# Patient Record
Sex: Female | Born: 1965 | Race: White | Hispanic: No | Marital: Married | Smoking: Never smoker
Health system: Southern US, Community
[De-identification: ages and names within clinical notes are randomized; demographics above are authoritative.]

## PROBLEM LIST (undated history)

## (undated) DIAGNOSIS — T7840XA Allergy, unspecified, initial encounter: Secondary | ICD-10-CM

## (undated) DIAGNOSIS — Z8489 Family history of other specified conditions: Secondary | ICD-10-CM

## (undated) DIAGNOSIS — M8580 Other specified disorders of bone density and structure, unspecified site: Secondary | ICD-10-CM

## (undated) DIAGNOSIS — Z923 Personal history of irradiation: Secondary | ICD-10-CM

## (undated) DIAGNOSIS — C801 Malignant (primary) neoplasm, unspecified: Secondary | ICD-10-CM

## (undated) DIAGNOSIS — M810 Age-related osteoporosis without current pathological fracture: Secondary | ICD-10-CM

## (undated) HISTORY — DX: Allergy, unspecified, initial encounter: T78.40XA

## (undated) HISTORY — DX: Age-related osteoporosis without current pathological fracture: M81.0

## (undated) HISTORY — DX: Other specified disorders of bone density and structure, unspecified site: M85.80

---

## 1987-04-24 HISTORY — PX: WISDOM TOOTH EXTRACTION: SHX21

## 1998-01-10 ENCOUNTER — Other Ambulatory Visit: Admission: RE | Admit: 1998-01-10 | Discharge: 1998-01-10 | Payer: Self-pay | Admitting: Obstetrics and Gynecology

## 1999-01-23 ENCOUNTER — Other Ambulatory Visit: Admission: RE | Admit: 1999-01-23 | Discharge: 1999-01-23 | Payer: Self-pay | Admitting: Obstetrics and Gynecology

## 1999-04-10 ENCOUNTER — Encounter: Admission: RE | Admit: 1999-04-10 | Discharge: 1999-04-10 | Payer: Self-pay | Admitting: Obstetrics and Gynecology

## 1999-04-10 ENCOUNTER — Encounter: Payer: Self-pay | Admitting: Obstetrics and Gynecology

## 2000-03-18 ENCOUNTER — Other Ambulatory Visit: Admission: RE | Admit: 2000-03-18 | Discharge: 2000-03-18 | Payer: Self-pay | Admitting: Obstetrics and Gynecology

## 2000-05-16 ENCOUNTER — Encounter: Payer: Self-pay | Admitting: Obstetrics and Gynecology

## 2000-05-16 ENCOUNTER — Encounter: Admission: RE | Admit: 2000-05-16 | Discharge: 2000-05-16 | Payer: Self-pay | Admitting: Obstetrics and Gynecology

## 2001-05-13 ENCOUNTER — Other Ambulatory Visit: Admission: RE | Admit: 2001-05-13 | Discharge: 2001-05-13 | Payer: Self-pay | Admitting: Obstetrics and Gynecology

## 2002-06-16 ENCOUNTER — Other Ambulatory Visit: Admission: RE | Admit: 2002-06-16 | Discharge: 2002-06-16 | Payer: Self-pay | Admitting: Obstetrics and Gynecology

## 2002-12-08 ENCOUNTER — Encounter: Payer: Self-pay | Admitting: Obstetrics and Gynecology

## 2002-12-08 ENCOUNTER — Encounter: Admission: RE | Admit: 2002-12-08 | Discharge: 2002-12-08 | Payer: Self-pay | Admitting: Obstetrics and Gynecology

## 2003-07-20 ENCOUNTER — Other Ambulatory Visit: Admission: RE | Admit: 2003-07-20 | Discharge: 2003-07-20 | Payer: Self-pay | Admitting: Obstetrics and Gynecology

## 2004-10-17 ENCOUNTER — Other Ambulatory Visit: Admission: RE | Admit: 2004-10-17 | Discharge: 2004-10-17 | Payer: Self-pay | Admitting: Obstetrics and Gynecology

## 2013-05-14 ENCOUNTER — Other Ambulatory Visit: Payer: Self-pay | Admitting: Obstetrics and Gynecology

## 2013-05-14 DIAGNOSIS — Z803 Family history of malignant neoplasm of breast: Secondary | ICD-10-CM

## 2014-12-21 ENCOUNTER — Ambulatory Visit (INDEPENDENT_AMBULATORY_CARE_PROVIDER_SITE_OTHER): Payer: 59 | Admitting: Family Medicine

## 2014-12-21 ENCOUNTER — Encounter: Payer: Self-pay | Admitting: Family Medicine

## 2014-12-21 VITALS — BP 128/74 | HR 70 | Temp 98.2°F | Ht 64.75 in | Wt 123.8 lb

## 2014-12-21 DIAGNOSIS — Z0000 Encounter for general adult medical examination without abnormal findings: Secondary | ICD-10-CM

## 2014-12-21 DIAGNOSIS — Z01419 Encounter for gynecological examination (general) (routine) without abnormal findings: Secondary | ICD-10-CM | POA: Insufficient documentation

## 2014-12-21 LAB — COMPREHENSIVE METABOLIC PANEL
ALT: 15 U/L (ref 0–35)
AST: 15 U/L (ref 0–37)
Albumin: 4.2 g/dL (ref 3.5–5.2)
Alkaline Phosphatase: 66 U/L (ref 39–117)
BUN: 12 mg/dL (ref 6–23)
CO2: 30 mEq/L (ref 19–32)
Calcium: 9.5 mg/dL (ref 8.4–10.5)
Chloride: 102 mEq/L (ref 96–112)
Creatinine, Ser: 0.68 mg/dL (ref 0.40–1.20)
GFR: 96.84 mL/min (ref >60.00)
Glucose, Bld: 82 mg/dL (ref 70–99)
Potassium: 4.3 mEq/L (ref 3.5–5.1)
Sodium: 137 mEq/L (ref 135–145)
Total Bilirubin: 0.5 mg/dL (ref 0.2–1.2)
Total Protein: 7 g/dL (ref 6.0–8.3)

## 2014-12-21 LAB — CBC WITH DIFFERENTIAL/PLATELET
Basophils Absolute: 0 10*3/uL (ref 0.0–0.1)
Basophils Relative: 0.6 % (ref 0.0–3.0)
Eosinophils Absolute: 0.1 10*3/uL (ref 0.0–0.7)
Eosinophils Relative: 1.5 % (ref 0.0–5.0)
HCT: 44.1 % (ref 36.0–46.0)
Hemoglobin: 14.4 g/dL (ref 12.0–15.0)
Lymphocytes Relative: 31.8 % (ref 12.0–46.0)
Lymphs Abs: 2.1 10*3/uL (ref 0.7–4.0)
MCHC: 32.6 g/dL (ref 30.0–36.0)
MCV: 93.8 fl (ref 78.0–100.0)
Monocytes Absolute: 0.5 10*3/uL (ref 0.1–1.0)
Monocytes Relative: 7.8 % (ref 3.0–12.0)
Neutro Abs: 3.8 10*3/uL (ref 1.4–7.7)
Neutrophils Relative %: 58.3 % (ref 43.0–77.0)
Platelets: 315 10*3/uL (ref 150.0–400.0)
RBC: 4.7 Mil/uL (ref 3.87–5.11)
RDW: 13 % (ref 11.5–15.5)
WBC: 6.5 10*3/uL (ref 4.0–10.5)

## 2014-12-21 LAB — LIPID PANEL
Cholesterol: 132 mg/dL (ref 0–200)
HDL: 67.4 mg/dL (ref >39.00)
LDL Cholesterol: 36 mg/dL (ref 0–99)
NonHDL: 64.43
Total CHOL/HDL Ratio: 2
Triglycerides: 141 mg/dL (ref 0.0–149.0)
VLDL: 28.2 mg/dL (ref 0.0–40.0)

## 2014-12-21 LAB — VITAMIN D 25 HYDROXY (VIT D DEFICIENCY, FRACTURES): VITD: 47.78 ng/mL (ref 30.00–100.00)

## 2014-12-21 LAB — TSH: TSH: 1.48 u[IU]/mL (ref 0.35–4.50)

## 2014-12-21 NOTE — Progress Notes (Signed)
Subjective:   Patient ID: Susan Reid, female    DOB: [birth year], 49 y.o.   MRN: [id number]  Susan Reid is a pleasant 49 y.o. year old female who presents to clinic today with Establish Care  on 12/21/2014  HPI: G2P2- has OBGYN, Dr. Radene Knee.  Per pt, pap smear and mammogram done 06/2014.  Has no complaints today.  Active- walks daily.  Saw dermatologist, Dr. Allyson Sabal, last month for skin survey.  No current outpatient prescriptions on file prior to visit.   No current facility-administered medications on file prior to visit.    No Known Allergies  Past Medical History  Diagnosis Date  . Allergy   . Osteopenia     Past Surgical History  Procedure Laterality Date  . Wisdom tooth extraction  1989    Family History  Problem Relation Age of Onset  . Arthritis Mother   . Cancer Mother   . Hyperlipidemia Mother   . Heart disease Mother   . Stroke Mother   . Hypertension Mother   . Kidney disease Mother   . Mental illness Father   . Depression Father   . Cancer Maternal Grandfather     Social History   Social History  . Marital Status: Single    Spouse Name: N/A  . Number of Children: N/A  . Years of Education: N/A   Occupational History  . Not on file.   Social History Main Topics  . Smoking status: Never Smoker   . Smokeless tobacco: Never Used  . Alcohol Use: No  . Drug Use: No  . Sexual Activity: Yes   Other Topics Concern  . Not on file   Social History Narrative  . No narrative on file   The PMH, PSH, Social History, Family History, Medications, and allergies have been reviewed in [facility], and have been updated if relevant.   Review of Systems  Constitutional: Negative.   HENT: Negative.   Eyes: Negative.   Respiratory: Negative.   Cardiovascular: Negative.   Gastrointestinal: Negative.   Endocrine: Negative.   Genitourinary: Negative.   Musculoskeletal: Negative.   Skin: Negative.   Allergic/Immunologic: Negative.   Neurological:  Negative.   Hematological: Negative.   Psychiatric/Behavioral: Negative.   All other systems reviewed and are negative.      Objective:    BP 128/74 mmHg  Pulse 70  Temp(Src) 98.2 F (36.8 C) (Oral)  Ht 5' 4.75" (1.645 m)  Wt 123 lb 12 oz (56.133 kg)  BMI 20.74 kg/m2  SpO2 98%  LMP 12/11/2014   Physical Exam   General:  Well-developed,well-nourished,in no acute distress; alert,appropriate and cooperative throughout examination Head:  normocephalic and atraumatic.   Eyes:  vision grossly intact, pupils equal, pupils round, and pupils reactive to light.   Ears:  R ear normal and L ear normal.   Nose:  no external deformity.   Mouth:  good dentition.   Neck:  No deformities, masses, or tenderness noted. Lungs:  Normal respiratory effort, chest expands symmetrically. Lungs are clear to auscultation, no crackles or wheezes. Heart:  Normal rate and regular rhythm. S1 and S2 normal without gallop, murmur, click, rub or other extra sounds. Abdomen:  Bowel sounds positive,abdomen soft and non-tender without masses, organomegaly or hernias noted. Msk:  No deformity or scoliosis noted of thoracic or lumbar spine.   Extremities:  No clubbing, cyanosis, edema, or deformity noted with normal full range of motion of all joints.   Neurologic:  alert & oriented X3 and  gait normal.   Skin:  Intact without suspicious lesions or rashes Cervical Nodes:  No lymphadenopathy noted Axillary Nodes:  No palpable lymphadenopathy Psych:  Cognition and judgment appear intact. Alert and cooperative with normal attention span and concentration. No apparent delusions, illusions, hallucinations       Assessment & Plan:   Well woman exam No Follow-up on file.

## 2014-12-21 NOTE — Progress Notes (Signed)
Pre visit review using our clinic review tool, if applicable. No additional management support is needed unless otherwise documented below in the visit note. 

## 2014-12-21 NOTE — Patient Instructions (Signed)
Great to see you. We will call you with your lab results and you can see them online.

## 2014-12-21 NOTE — Assessment & Plan Note (Signed)
Reviewed preventive care protocols, scheduled due services, and updated immunizations Discussed nutrition, exercise, diet, and healthy lifestyle.  Declines flu shot.  Orders Placed This Encounter  Procedures  . CBC with Differential/Platelet  . Comprehensive metabolic panel  . Lipid panel  . TSH  . Vitamin D, 25-hydroxy

## 2014-12-22 ENCOUNTER — Encounter: Payer: Self-pay | Admitting: *Deleted

## 2015-11-14 ENCOUNTER — Telehealth: Payer: Self-pay | Admitting: Family Medicine

## 2015-11-14 NOTE — Telephone Encounter (Signed)
Pt was seen at urgent care 11/08/15 and was given injection of prednisone for poison ivy.  Rash is continuing to spread on legs and pt would like to know if she can get an injection from our office once a week.  Please advise.  [facility], Pt is requesting a callback at [phone]

## 2015-11-14 NOTE — Telephone Encounter (Signed)
Spoke to pt who states she is currently on prednisone dose pak and will continue it until completed

## 2015-11-14 NOTE — Telephone Encounter (Signed)
Lm on pts vm requesting a call back 

## 2015-11-14 NOTE — Telephone Encounter (Signed)
Unfortunately, it would not be good medical care to give her an injection of steroids once a week.  We could certainly place her on a short course of oral prednisone.

## 2016-01-12 ENCOUNTER — Other Ambulatory Visit: Payer: Self-pay | Admitting: Family Medicine

## 2016-01-12 DIAGNOSIS — Z01419 Encounter for gynecological examination (general) (routine) without abnormal findings: Secondary | ICD-10-CM

## 2016-01-20 ENCOUNTER — Other Ambulatory Visit (INDEPENDENT_AMBULATORY_CARE_PROVIDER_SITE_OTHER): Payer: 59

## 2016-01-20 DIAGNOSIS — Z0000 Encounter for general adult medical examination without abnormal findings: Secondary | ICD-10-CM

## 2016-01-20 DIAGNOSIS — Z01419 Encounter for gynecological examination (general) (routine) without abnormal findings: Secondary | ICD-10-CM

## 2016-01-20 LAB — CBC WITH DIFFERENTIAL/PLATELET
BASOS PCT: 0.4 % (ref 0.0–3.0)
Basophils Absolute: 0 10*3/uL (ref 0.0–0.1)
EOS ABS: 0.2 10*3/uL (ref 0.0–0.7)
Eosinophils Relative: 3.6 % (ref 0.0–5.0)
HCT: 43 % (ref 36.0–46.0)
HEMOGLOBIN: 14.6 g/dL (ref 12.0–15.0)
Lymphocytes Relative: 23.8 % (ref 12.0–46.0)
Lymphs Abs: 1.4 10*3/uL (ref 0.7–4.0)
MCHC: 34 g/dL (ref 30.0–36.0)
MCV: 92.1 fl (ref 78.0–100.0)
MONO ABS: 0.5 10*3/uL (ref 0.1–1.0)
Monocytes Relative: 8.8 % (ref 3.0–12.0)
NEUTROS PCT: 63.4 % (ref 43.0–77.0)
Neutro Abs: 3.8 10*3/uL (ref 1.4–7.7)
Platelets: 303 10*3/uL (ref 150.0–400.0)
RBC: 4.66 Mil/uL (ref 3.87–5.11)
RDW: 13.1 % (ref 11.5–15.5)
WBC: 6.1 10*3/uL (ref 4.0–10.5)

## 2016-01-20 LAB — COMPREHENSIVE METABOLIC PANEL
ALBUMIN: 4.1 g/dL (ref 3.5–5.2)
ALT: 17 U/L (ref 0–35)
AST: 20 U/L (ref 0–37)
Alkaline Phosphatase: 81 U/L (ref 39–117)
BUN: 13 mg/dL (ref 6–23)
CHLORIDE: 104 meq/L (ref 96–112)
CO2: 31 meq/L (ref 19–32)
Calcium: 9.2 mg/dL (ref 8.4–10.5)
Creatinine, Ser: 0.76 mg/dL (ref 0.40–1.20)
GFR: 84.81 mL/min (ref >60.00)
GLUCOSE: 88 mg/dL (ref 70–99)
POTASSIUM: 4.2 meq/L (ref 3.5–5.1)
SODIUM: 139 meq/L (ref 135–145)
Total Bilirubin: 0.6 mg/dL (ref 0.2–1.2)
Total Protein: 6.9 g/dL (ref 6.0–8.3)

## 2016-01-20 LAB — LIPID PANEL
CHOL/HDL RATIO: 2
CHOLESTEROL: 144 mg/dL (ref 0–200)
HDL: 78.7 mg/dL (ref >39.00)
LDL CALC: 50 mg/dL (ref 0–99)
NONHDL: 65.71
Triglycerides: 78 mg/dL (ref 0.0–149.0)
VLDL: 15.6 mg/dL (ref 0.0–40.0)

## 2016-01-20 LAB — TSH: TSH: 2.36 u[IU]/mL (ref 0.35–4.50)

## 2016-01-24 ENCOUNTER — Ambulatory Visit (INDEPENDENT_AMBULATORY_CARE_PROVIDER_SITE_OTHER): Payer: 59 | Admitting: Family Medicine

## 2016-01-24 VITALS — BP 114/70 | HR 64 | Temp 98.4°F | Ht 64.5 in | Wt 123.5 lb

## 2016-01-24 DIAGNOSIS — Z01419 Encounter for gynecological examination (general) (routine) without abnormal findings: Secondary | ICD-10-CM | POA: Diagnosis not present

## 2016-01-24 NOTE — Patient Instructions (Signed)
Great to see you. Please say hi to your family for me! 

## 2016-01-24 NOTE — Progress Notes (Signed)
Pre visit review using our clinic review tool, if applicable. No additional management support is needed unless otherwise documented below in the visit note. 

## 2016-01-24 NOTE — Assessment & Plan Note (Signed)
Reviewed preventive care protocols, scheduled due services, and updated immunizations Discussed nutrition, exercise, diet, and healthy lifestyle.  

## 2016-01-24 NOTE — Progress Notes (Signed)
Subjective:   Patient ID: Susan Reid, female    DOB: 1965-06-29, 50 y.o.   MRN: [id number]  Susan Reid is a pleasant 50 y.o. year old female who presents to clinic today with Annual Exam  on 01/24/2016  HPI: G2P2- has OBGYN, Dr. Radene Knee.  Per pt, pap smear and mammogram done 07/2015.Marland Kitchen   Active- walks daily.  Has dermatologist, Dr. Allyson Sabal.  Last saw him in 08/2015.  Lab Results  Component Value Date   CHOL 144 01/20/2016   HDL 78.70 01/20/2016   LDLCALC 50 01/20/2016   TRIG 78.0 01/20/2016   CHOLHDL 2 01/20/2016   Lab Results  Component Value Date   CREATININE 0.76 01/20/2016   Lab Results  Component Value Date   TSH 2.36 01/20/2016   Lab Results  Component Value Date   WBC 6.1 01/20/2016   HGB 14.6 01/20/2016   HCT 43.0 01/20/2016   MCV 92.1 01/20/2016   PLT 303.0 01/20/2016     Current Outpatient Prescriptions on File Prior to Visit  Medication Sig Dispense Refill  . cholecalciferol (VITAMIN D) 1000 UNITS tablet Take 1,000 Units by mouth daily.    . Multiple Vitamin (MULTIVITAMIN) tablet Take 1 tablet by mouth daily.     No current facility-administered medications on file prior to visit.     No Known Allergies  Past Medical History:  Diagnosis Date  . Allergy   . Osteopenia     Past Surgical History:  Procedure Laterality Date  . WISDOM TOOTH EXTRACTION  1989    Family History  Problem Relation Age of Onset  . Arthritis Mother   . Cancer Mother   . Hyperlipidemia Mother   . Heart disease Mother   . Stroke Mother   . Hypertension Mother   . Kidney disease Mother   . Mental illness Father   . Depression Father   . Cancer Maternal Grandfather     Social History   Social History  . Marital status: Single    Spouse name: N/A  . Number of children: N/A  . Years of education: N/A   Occupational History  . Not on file.   Social History Main Topics  . Smoking status: Never Smoker  . Smokeless tobacco: Never Used  . Alcohol use No  .  Drug use: No  . Sexual activity: Yes   Other Topics Concern  . Not on file   Social History Narrative  . No narrative on file   The PMH, PSH, Social History, Family History, Medications, and allergies have been reviewed in [facility], and have been updated if relevant.   Review of Systems  Constitutional: Negative.   HENT: Negative.   Eyes: Negative.   Respiratory: Negative.   Cardiovascular: Negative.   Gastrointestinal: Negative.   Endocrine: Negative.   Genitourinary: Negative.   Musculoskeletal: Negative.   Skin: Negative.   Allergic/Immunologic: Negative.   Neurological: Negative.   Hematological: Negative.   Psychiatric/Behavioral: Negative.   All other systems reviewed and are negative.      Objective:    BP 114/70   Pulse 64   Temp 98.4 F (36.9 C) (Oral)   Ht 5' 4.5" (1.638 m)   Wt 123 lb 8 oz (56 kg)   LMP 01/24/2016   SpO2 99%   BMI 20.87 kg/m   Wt Readings from Last 3 Encounters:  01/24/16 123 lb 8 oz (56 kg)  12/21/14 123 lb 12 oz (56.1 kg)    Physical Exam   General:  Well-developed,well-nourished,in no acute distress; alert,appropriate and cooperative throughout examination Head:  normocephalic and atraumatic.   Eyes:  vision grossly intact, pupils equal, pupils round, and pupils reactive to light.   Ears:  R ear normal and L ear normal.   Nose:  no external deformity.   Mouth:  good dentition.   Neck:  No deformities, masses, or tenderness noted. Lungs:  Normal respiratory effort, chest expands symmetrically. Lungs are clear to auscultation, no crackles or wheezes. Heart:  Normal rate and regular rhythm. S1 and S2 normal without gallop, murmur, click, rub or other extra sounds. Abdomen:  Bowel sounds positive,abdomen soft and non-tender without masses, organomegaly or hernias noted. Msk:  No deformity or scoliosis noted of thoracic or lumbar spine.   Extremities:  No clubbing, cyanosis, edema, or deformity noted with normal full range of motion  of all joints.   Neurologic:  alert & oriented X3 and gait normal.   Skin:  Intact without suspicious lesions or rashes Cervical Nodes:  No lymphadenopathy noted Axillary Nodes:  No palpable lymphadenopathy Psych:  Cognition and judgment appear intact. Alert and cooperative with normal attention span and concentration. No apparent delusions, illusions, hallucinations       Assessment & Plan:   Well woman exam No Follow-up on file.

## 2016-05-25 ENCOUNTER — Encounter: Payer: Self-pay | Admitting: Gastroenterology

## 2016-07-02 ENCOUNTER — Ambulatory Visit (AMBULATORY_SURGERY_CENTER): Payer: Self-pay

## 2016-07-02 ENCOUNTER — Encounter: Payer: Self-pay | Admitting: Gastroenterology

## 2016-07-02 VITALS — Ht 65.0 in | Wt 129.2 lb

## 2016-07-02 DIAGNOSIS — Z1211 Encounter for screening for malignant neoplasm of colon: Secondary | ICD-10-CM

## 2016-07-02 MED ORDER — SUPREP BOWEL PREP KIT 17.5-3.13-1.6 GM/177ML PO SOLN: 1.0000 | Freq: Once | ORAL | 0 refills | Status: AC

## 2016-07-02 NOTE — Progress Notes (Signed)
No allergies to eggs or soy No diet meds No home oxygen No past problems with anesthesia  Declined emmi 

## 2016-07-16 ENCOUNTER — Encounter: Payer: Self-pay | Admitting: Gastroenterology

## 2016-07-16 ENCOUNTER — Ambulatory Visit (AMBULATORY_SURGERY_CENTER): Payer: 59 | Admitting: Gastroenterology

## 2016-07-16 VITALS — BP 114/75 | HR 76 | Temp 98.0°F | Resp 11

## 2016-07-16 DIAGNOSIS — Z1211 Encounter for screening for malignant neoplasm of colon: Secondary | ICD-10-CM

## 2016-07-16 DIAGNOSIS — D124 Benign neoplasm of descending colon: Secondary | ICD-10-CM | POA: Diagnosis not present

## 2016-07-16 DIAGNOSIS — D120 Benign neoplasm of cecum: Secondary | ICD-10-CM | POA: Diagnosis not present

## 2016-07-16 DIAGNOSIS — Z1212 Encounter for screening for malignant neoplasm of rectum: Secondary | ICD-10-CM

## 2016-07-16 MED ORDER — SODIUM CHLORIDE 0.9 % IV SOLN: 500.0000 mL | INTRAVENOUS | Status: DC

## 2016-07-16 NOTE — Patient Instructions (Signed)
Findings:  Diverticulosis, polyps Recommendations:  Resume previous diet, continue current medications, Repeat colonoscopy depending on pathology results.  YOU HAD AN ENDOSCOPIC PROCEDURE TODAY AT [facility]:   Refer to the procedure report that was given to you for any specific questions about what was found during the examination.  If the procedure report does not answer your questions, please call your gastroenterologist to clarify.  If you requested that your care partner not be given the details of your procedure findings, then the procedure report has been included in a sealed envelope for you to review at your convenience later.  YOU SHOULD EXPECT: Some feelings of bloating in the abdomen. Passage of more gas than usual.  Walking can help get rid of the air that was put into your GI tract during the procedure and reduce the bloating. If you had a lower endoscopy (such as a colonoscopy or flexible sigmoidoscopy) you may notice spotting of blood in your stool or on the toilet paper. If you underwent a bowel prep for your procedure, you may not have a normal bowel movement for a few days.  Please Note:  You might notice some irritation and congestion in your nose or some drainage.  This is from the oxygen used during your procedure.  There is no need for concern and it should clear up in a day or so.  SYMPTOMS TO REPORT IMMEDIATELY:   Following lower endoscopy (colonoscopy or flexible sigmoidoscopy):  Excessive amounts of blood in the stool  Significant tenderness or worsening of abdominal pains  Swelling of the abdomen that is new, acute  Fever of 100F or higher   Following upper endoscopy (EGD)  Vomiting of blood or coffee ground material  New chest pain or pain under the shoulder blades  Painful or persistently difficult swallowing  New shortness of breath  Fever of 100F or higher  Black, tarry-looking stools  For urgent or emergent issues, a gastroenterologist  can be reached at any hour by calling [phone].   DIET:  We do recommend a small meal at first, but then you may proceed to your regular diet.  Drink plenty of fluids but you should avoid alcoholic beverages for 24 hours.  ACTIVITY:  You should plan to take it easy for the rest of today and you should NOT DRIVE or use heavy machinery until tomorrow (because of the sedation medicines used during the test).    FOLLOW UP: Our staff will call the number listed on your records the next business day following your procedure to check on you and address any questions or concerns that you may have regarding the information given to you following your procedure. If we do not reach you, we will leave a message.  However, if you are feeling well and you are not experiencing any problems, there is no need to return our call.  We will assume that you have returned to your regular daily activities without incident.  If any biopsies were taken you will be contacted by phone or by letter within the next 1-3 weeks.  Please call us at [phone] if you have not heard about the biopsies in 3 weeks.    SIGNATURES/CONFIDENTIALITY: You and/or your care partner have signed paperwork which will be entered into your electronic medical record.  These signatures attest to the fact that that the information above on your After Visit Summary has been reviewed and is understood.  Full responsibility of the confidentiality of this discharge  information lies with you and/or your care-partner.

## 2016-07-16 NOTE — Progress Notes (Signed)
Called to room to assist during endoscopic procedure.  Patient ID and intended procedure confirmed with present staff. Received instructions for my participation in the procedure from the performing physician.  

## 2016-07-16 NOTE — Progress Notes (Signed)
No egg or soy allergy known to patient  No issues with past sedation with any surgeries  or procedures, no intubation problems  No diet pills per patient No home 02 use per patient  No blood thinners per patient  Pt denies issues with constipation  No A fib or A flutter   

## 2016-07-16 NOTE — Progress Notes (Signed)
Report given to PACU, vss 

## 2016-07-16 NOTE — Op Note (Signed)
[place] Patient Name: Susan Reid Procedure Date: 07/16/2016 8:53 AM MRN: [id number] Endoscopist: Mallie Mussel L. Loletha Carrow , MD Age: 51 Referring MD:  Date of Birth: 1966/03/14 Gender: Female Account #: [id number] Procedure:                Colonoscopy Indications:              Screening for colorectal malignant neoplasm, This                            is the patient's first colonoscopy Medicines:                Monitored Anesthesia Care Procedure:                Pre-Anesthesia Assessment:                           - Prior to the procedure, a History and Physical                            was performed, and patient medications and                            allergies were reviewed. The patient's tolerance of                            previous anesthesia was also reviewed. The risks                            and benefits of the procedure and the sedation                            options and risks were discussed with the patient.                            All questions were answered, and informed consent                            was obtained. Prior Anticoagulants: The patient has                            taken no previous anticoagulant or antiplatelet                            agents. ASA Grade Assessment: I - A normal, healthy                            patient. After reviewing the risks and benefits,                            the patient was deemed in satisfactory condition to                            undergo the procedure.  After obtaining informed consent, the colonoscope                            was passed under direct vision. Throughout the                            procedure, the patient's blood pressure, pulse, and                            oxygen saturations were monitored continuously. The                            Model PCF-H190DL [id number]) scope was introduced                            through the anus and advanced to the  the cecum,                            identified by appendiceal orifice and ileocecal                            valve. The colonoscopy was performed without                            difficulty. The patient tolerated the procedure                            well. The quality of the bowel preparation was                            excellent. The ileocecal valve, appendiceal                            orifice, and rectum were photographed. The quality                            of the bowel preparation was evaluated using the                            BBPS [facility] Bowel Preparation Scale) with scores                            of: Right Colon = 3, Transverse Colon = 3 and Left                            Colon = 3 (entire mucosa seen well with no residual                            staining, small fragments of stool or opaque                            liquid). The total BBPS score equals 9. The bowel  preparation used was SUPREP. Scope In: 8:59:43 AM Scope Out: 9:25:37 AM Scope Withdrawal Time: 0 hours 21 minutes 52 seconds  Total Procedure Duration: 0 hours 25 minutes 54 seconds  Findings:                 The perianal and digital rectal examinations were                            normal.                           Multiple diverticula were found in the left colon.                           Two sessile polyps were found in the splenic                            flexure and cecum. The polyps were 4 to 8 mm in                            size. These polyps were removed with a cold snare.                            Resection and retrieval were complete.                           The exam was otherwise without abnormality on                            direct and retroflexion views. Complications:            No immediate complications. Estimated Blood Loss:     Estimated blood loss: none. Impression:               - Diverticulosis in the left colon.                            - Two 4 to 8 mm polyps at the splenic flexure and                            in the cecum, removed with a cold snare. Resected                            and retrieved.                           - The examination was otherwise normal on direct                            and retroflexion views. Recommendation:           - Patient has a contact number available for                            emergencies. The signs and symptoms of potential  delayed complications were discussed with the                            patient. Return to normal activities tomorrow.                            Written discharge instructions were provided to the                            patient.                           - Resume previous diet.                           - Continue present medications.                           - Await pathology results.                           - Repeat colonoscopy is recommended for                            surveillance. The colonoscopy date will be                            determined after pathology results from today's                            exam become available for review. Henry L. Loletha Carrow, MD 07/16/2016 9:29:46 AM This report has been signed electronically.

## 2016-07-17 ENCOUNTER — Telehealth: Payer: Self-pay

## 2016-07-17 NOTE — Telephone Encounter (Signed)
  Follow up Call-  Call back number 07/16/2016  Post procedure Call Back phone  # [phone]  Permission to leave phone message Yes  Some recent data might be hidden     Patient questions:  Do you have a fever, pain , or abdominal swelling? No. Pain Score  0 *  Have you tolerated food without any problems? Yes.    Have you been able to return to your normal activities? Yes.    Do you have any questions about your discharge instructions: Diet   No. Medications  No. Follow up visit  No.  Do you have questions or concerns about your Care? No.  Actions: * If pain score is 4 or above: No action needed, pain <4.

## 2016-07-25 ENCOUNTER — Telehealth: Payer: Self-pay | Admitting: Gastroenterology

## 2016-07-25 NOTE — Telephone Encounter (Signed)
Left message for patient that unfortunately Dr. Loletha Carrow is out of the office this week and will be back on Monday. Gently reminded patient, that within visit summary it states that getting results can take 1-3 weeks. Let her know that I would send a reminder to Dr. Loletha Carrow to please view path results, as soon as possible.

## 2016-07-30 ENCOUNTER — Encounter: Payer: Self-pay | Admitting: Gastroenterology

## 2016-08-14 DIAGNOSIS — Z1231 Encounter for screening mammogram for malignant neoplasm of breast: Secondary | ICD-10-CM | POA: Diagnosis not present

## 2016-09-20 DIAGNOSIS — Z01419 Encounter for gynecological examination (general) (routine) without abnormal findings: Secondary | ICD-10-CM | POA: Diagnosis not present

## 2016-09-20 DIAGNOSIS — Z6820 Body mass index (BMI) 20.0-20.9, adult: Secondary | ICD-10-CM | POA: Diagnosis not present

## 2017-02-19 ENCOUNTER — Other Ambulatory Visit: Payer: Self-pay | Admitting: Family Medicine

## 2017-02-19 DIAGNOSIS — Z01419 Encounter for gynecological examination (general) (routine) without abnormal findings: Secondary | ICD-10-CM

## 2017-02-22 ENCOUNTER — Other Ambulatory Visit (INDEPENDENT_AMBULATORY_CARE_PROVIDER_SITE_OTHER): Payer: 59

## 2017-02-22 DIAGNOSIS — Z01419 Encounter for gynecological examination (general) (routine) without abnormal findings: Secondary | ICD-10-CM

## 2017-02-22 LAB — LIPID PANEL
CHOLESTEROL: 120 mg/dL (ref 0–200)
HDL: 68.5 mg/dL (ref >39.00)
LDL CALC: 40 mg/dL (ref 0–99)
NonHDL: 51.4
Total CHOL/HDL Ratio: 2
Triglycerides: 57 mg/dL (ref 0.0–149.0)
VLDL: 11.4 mg/dL (ref 0.0–40.0)

## 2017-02-22 LAB — CBC WITH DIFFERENTIAL/PLATELET
Basophils Absolute: 0 10*3/uL (ref 0.0–0.1)
Basophils Relative: 0.6 % (ref 0.0–3.0)
EOS ABS: 0.1 10*3/uL (ref 0.0–0.7)
Eosinophils Relative: 2.5 % (ref 0.0–5.0)
HCT: 45 % (ref 36.0–46.0)
HEMOGLOBIN: 14.6 g/dL (ref 12.0–15.0)
LYMPHS ABS: 1.7 10*3/uL (ref 0.7–4.0)
Lymphocytes Relative: 29.1 % (ref 12.0–46.0)
MCHC: 32.5 g/dL (ref 30.0–36.0)
MCV: 95 fl (ref 78.0–100.0)
MONO ABS: 0.5 10*3/uL (ref 0.1–1.0)
Monocytes Relative: 9.4 % (ref 3.0–12.0)
NEUTROS PCT: 58.4 % (ref 43.0–77.0)
Neutro Abs: 3.4 10*3/uL (ref 1.4–7.7)
Platelets: 277 10*3/uL (ref 150.0–400.0)
RBC: 4.73 Mil/uL (ref 3.87–5.11)
RDW: 12.8 % (ref 11.5–15.5)
WBC: 5.8 10*3/uL (ref 4.0–10.5)

## 2017-02-22 LAB — COMPREHENSIVE METABOLIC PANEL
ALBUMIN: 4.3 g/dL (ref 3.5–5.2)
ALK PHOS: 64 U/L (ref 39–117)
ALT: 13 U/L (ref 0–35)
AST: 18 U/L (ref 0–37)
BUN: 16 mg/dL (ref 6–23)
CHLORIDE: 107 meq/L (ref 96–112)
CO2: 28 mEq/L (ref 19–32)
CREATININE: 0.82 mg/dL (ref 0.40–1.20)
Calcium: 9.6 mg/dL (ref 8.4–10.5)
GFR: 77.37 mL/min (ref >60.00)
GLUCOSE: 95 mg/dL (ref 70–99)
POTASSIUM: 5.2 meq/L — AB (ref 3.5–5.1)
SODIUM: 140 meq/L (ref 135–145)
TOTAL PROTEIN: 6.9 g/dL (ref 6.0–8.3)
Total Bilirubin: 0.6 mg/dL (ref 0.2–1.2)

## 2017-02-22 LAB — TSH: TSH: 2.2 u[IU]/mL (ref 0.35–4.50)

## 2017-02-26 ENCOUNTER — Encounter: Payer: Self-pay | Admitting: Family Medicine

## 2017-02-26 ENCOUNTER — Ambulatory Visit (INDEPENDENT_AMBULATORY_CARE_PROVIDER_SITE_OTHER): Payer: 59 | Admitting: Family Medicine

## 2017-02-26 VITALS — BP 122/70 | HR 81 | Temp 98.4°F | Ht 65.0 in | Wt 126.1 lb

## 2017-02-26 DIAGNOSIS — Z01419 Encounter for gynecological examination (general) (routine) without abnormal findings: Secondary | ICD-10-CM

## 2017-02-26 DIAGNOSIS — Z23 Encounter for immunization: Secondary | ICD-10-CM

## 2017-02-26 NOTE — Assessment & Plan Note (Signed)
Reviewed preventive care protocols, scheduled due services, and updated immunizations Discussed nutrition, exercise, diet, and healthy lifestyle.  Orders Placed This Encounter  Procedures  . Tdap vaccine greater than or equal to 53yo IM

## 2017-02-26 NOTE — Progress Notes (Signed)
Subjective:   Patient ID: Susan Reid, female    DOB: 1965/06/29, 51 y.o.   MRN: [id number]  Susan Reid is a pleasant 51 y.o. year old female who presents to clinic today with Annual Exam (Patient is here today for her CPE.  PAP and Mammogram was done in April at [facility] which were WNL.  Colonoscopy completed in March next 5y.)  on 02/26/2017  HPI: G2P2- has OBGYN, Dr. Radene Knee.  Per pt, pap smear and mammogram done 07/2016.  Colonoscopy 07/16/16- 5 year recall.   Lab Results  Component Value Date   CHOL 120 02/22/2017   HDL 68.50 02/22/2017   LDLCALC 40 02/22/2017   TRIG 57.0 02/22/2017   CHOLHDL 2 02/22/2017   Lab Results  Component Value Date   CREATININE 0.82 02/22/2017   Lab Results  Component Value Date   TSH 2.20 02/22/2017   Lab Results  Component Value Date   WBC 5.8 02/22/2017   HGB 14.6 02/22/2017   HCT 45.0 02/22/2017   MCV 95.0 02/22/2017   PLT 277.0 02/22/2017     Current Outpatient Medications on File Prior to Visit  Medication Sig Dispense Refill  . Ascorbic Acid (VITAMIN C PO) Take by mouth daily.    . calcium citrate (CALCITRATE - DOSED IN MG ELEMENTAL CALCIUM) 950 MG tablet Take 200 mg of elemental calcium by mouth daily.    . cholecalciferol (VITAMIN D) 1000 UNITS tablet Take 1,000 Units by mouth daily.    . Multiple Vitamin (MULTIVITAMIN) tablet Take 1 tablet by mouth daily.     Current Facility-Administered Medications on File Prior to Visit  Medication Dose Route Frequency Provider Last Rate Last Dose  . 0.9 %  sodium chloride infusion  500 mL Intravenous Continuous Doran Stabler, MD        Allergies  Allergen Reactions  . Nitrates, Organic Swelling    Throat closes    Past Medical History:  Diagnosis Date  . Allergy   . Osteopenia     Past Surgical History:  Procedure Laterality Date  . WISDOM TOOTH EXTRACTION  1989    Family History  Problem Relation Age of Onset  . Arthritis Mother   . Cancer Mother     . Hyperlipidemia Mother   . Heart disease Mother   . Stroke Mother   . Hypertension Mother   . Kidney disease Mother   . Mental illness Father   . Depression Father   . Cancer Maternal Grandfather   . Colon cancer Neg Hx   . Colon polyps Neg Hx   . Esophageal cancer Neg Hx   . Rectal cancer Neg Hx   . Stomach cancer Neg Hx     Social History   Socioeconomic History  . Marital status: Married    Spouse name: Not on file  . Number of children: Not on file  . Years of education: Not on file  . Highest education level: Not on file  Social Needs  . Financial resource strain: Not on file  . Food insecurity - worry: Not on file  . Food insecurity - inability: Not on file  . Transportation needs - medical: Not on file  . Transportation needs - non-medical: Not on file  Occupational History  . Not on file  Tobacco Use  . Smoking status: Never Smoker  . Smokeless tobacco: Never Used  Substance and Sexual Activity  . Alcohol use: No  . Drug use: No  . Sexual activity:  Yes  Other Topics Concern  . Not on file  Social History Narrative  . Not on file   The PMH, PSH, Social History, Family History, Medications, and allergies have been reviewed in [facility], and have been updated if relevant.   Review of Systems  Constitutional: Negative.   HENT: Negative.   Eyes: Negative.   Respiratory: Negative.   Cardiovascular: Negative.   Gastrointestinal: Negative.   Endocrine: Negative.   Genitourinary: Negative.   Musculoskeletal: Negative.   Skin: Negative.   Allergic/Immunologic: Negative.   Neurological: Negative.   Hematological: Negative.   Psychiatric/Behavioral: Negative.   All other systems reviewed and are negative.      Objective:    BP 122/70 (BP Location: Right Arm, Patient Position: Sitting, Cuff Size: Normal)   Pulse 81   Temp 98.4 F (36.9 C) (Oral)   Ht 5\' 5"  (1.651 m)   Wt 126 lb 1.9 oz (57.2 kg)   LMP 02/02/2017   SpO2 98%   BMI 20.99 kg/m   Wt  Readings from Last 3 Encounters:  02/26/17 126 lb 1.9 oz (57.2 kg)  07/02/16 129 lb 3.2 oz (58.6 kg)  01/24/16 123 lb 8 oz (56 kg)    Physical Exam    General:  Well-developed,well-nourished,in no acute distress; alert,appropriate and cooperative throughout examination Head:  normocephalic and atraumatic.   Eyes:  vision grossly intact, PERRL Ears:  R ear normal and L ear normal externally, TMs clear bilaterally Nose:  no external deformity.   Mouth:  good dentition.   Neck:  No deformities, masses, or tenderness noted. Breasts:  No mass, nodules, thickening, tenderness, bulging, retraction, inflamation, nipple discharge or skin changes noted.   Lungs:  Normal respiratory effort, chest expands symmetrically. Lungs are clear to auscultation, no crackles or wheezes. Heart:  Normal rate and regular rhythm. S1 and S2 normal without gallop, murmur, click, rub or other extra sounds. Abdomen:  Bowel sounds positive,abdomen soft and non-tender without masses, organomegaly or hernias noted. Msk:  No deformity or scoliosis noted of thoracic or lumbar spine.   Extremities:  No clubbing, cyanosis, edema, or deformity noted with normal full range of motion of all joints.   Neurologic:  alert & oriented X3 and gait normal.   Skin:  Intact without suspicious lesions or rashes Cervical Nodes:  No lymphadenopathy noted Axillary Nodes:  No palpable lymphadenopathy Psych:  Cognition and judgment appear intact. Alert and cooperative with normal attention span and concentration. No apparent delusions, illusions, hallucinations       Assessment & Plan:   Well woman exam No Follow-up on file.

## 2017-09-10 DIAGNOSIS — D225 Melanocytic nevi of trunk: Secondary | ICD-10-CM | POA: Diagnosis not present

## 2017-09-10 DIAGNOSIS — L7211 Pilar cyst: Secondary | ICD-10-CM | POA: Diagnosis not present

## 2017-09-25 DIAGNOSIS — Z13820 Encounter for screening for osteoporosis: Secondary | ICD-10-CM | POA: Diagnosis not present

## 2017-09-25 DIAGNOSIS — Z01419 Encounter for gynecological examination (general) (routine) without abnormal findings: Secondary | ICD-10-CM | POA: Diagnosis not present

## 2017-09-25 DIAGNOSIS — Z1231 Encounter for screening mammogram for malignant neoplasm of breast: Secondary | ICD-10-CM | POA: Diagnosis not present

## 2017-09-25 DIAGNOSIS — Z6820 Body mass index (BMI) 20.0-20.9, adult: Secondary | ICD-10-CM | POA: Diagnosis not present

## 2018-02-27 ENCOUNTER — Encounter: Payer: Self-pay | Admitting: Family Medicine

## 2018-02-27 ENCOUNTER — Ambulatory Visit (INDEPENDENT_AMBULATORY_CARE_PROVIDER_SITE_OTHER): Payer: 59 | Admitting: Family Medicine

## 2018-02-27 VITALS — BP 110/66 | HR 77 | Temp 98.3°F | Ht 65.0 in | Wt 123.0 lb

## 2018-02-27 DIAGNOSIS — Z01419 Encounter for gynecological examination (general) (routine) without abnormal findings: Secondary | ICD-10-CM

## 2018-02-27 DIAGNOSIS — E7849 Other hyperlipidemia: Secondary | ICD-10-CM

## 2018-02-27 DIAGNOSIS — Z1159 Encounter for screening for other viral diseases: Secondary | ICD-10-CM | POA: Diagnosis not present

## 2018-02-27 DIAGNOSIS — Z0000 Encounter for general adult medical examination without abnormal findings: Secondary | ICD-10-CM | POA: Insufficient documentation

## 2018-02-27 DIAGNOSIS — Z23 Encounter for immunization: Secondary | ICD-10-CM | POA: Diagnosis not present

## 2018-02-27 LAB — COMPREHENSIVE METABOLIC PANEL
ALBUMIN: 4.8 g/dL (ref 3.5–5.2)
ALT: 15 U/L (ref 0–35)
AST: 18 U/L (ref 0–37)
Alkaline Phosphatase: 78 U/L (ref 39–117)
BUN: 18 mg/dL (ref 6–23)
CALCIUM: 10.1 mg/dL (ref 8.4–10.5)
CHLORIDE: 102 meq/L (ref 96–112)
CO2: 32 mEq/L (ref 19–32)
CREATININE: 0.74 mg/dL (ref 0.40–1.20)
GFR: 86.77 mL/min (ref >60.00)
Glucose, Bld: 97 mg/dL (ref 70–99)
Potassium: 4.2 mEq/L (ref 3.5–5.1)
Sodium: 141 mEq/L (ref 135–145)
Total Bilirubin: 0.7 mg/dL (ref 0.2–1.2)
Total Protein: 7.5 g/dL (ref 6.0–8.3)

## 2018-02-27 LAB — LIPID PANEL
CHOLESTEROL: 160 mg/dL (ref 0–200)
HDL: 80.4 mg/dL (ref >39.00)
LDL CALC: 58 mg/dL (ref 0–99)
NonHDL: 79.76
TRIGLYCERIDES: 108 mg/dL (ref 0.0–149.0)
Total CHOL/HDL Ratio: 2
VLDL: 21.6 mg/dL (ref 0.0–40.0)

## 2018-02-27 LAB — TSH: TSH: 1.67 u[IU]/mL (ref 0.35–4.50)

## 2018-02-27 LAB — CBC
HEMATOCRIT: 43.9 % (ref 36.0–46.0)
Hemoglobin: 14.5 g/dL (ref 12.0–15.0)
MCHC: 33.1 g/dL (ref 30.0–36.0)
MCV: 93.1 fl (ref 78.0–100.0)
PLATELETS: 294 10*3/uL (ref 150.0–400.0)
RBC: 4.71 Mil/uL (ref 3.87–5.11)
RDW: 13.4 % (ref 11.5–15.5)
WBC: 4.7 10*3/uL (ref 4.0–10.5)

## 2018-02-27 NOTE — Progress Notes (Signed)
Subjective:   Patient ID: Susan Reid, female    DOB: [birth year], 52 y.o.   MRN: [id number]  Susan Reid is a pleasant 52 y.o. year old female who presents to clinic today with Annual Exam (Fasting for labs. Has not had a flu shot prior-wants to wait on getting the vaccination, would like to discuss further. )  on 02/27/2018  HPI: G2P2- has OBGYN, Dr. Radene Knee.  Per pt, pap smear, DEXA and mammogram done 09/2017.  Colonoscopy 07/16/16- 5 year recall.  Health Maintenance  Topic Date Due  . Hepatitis C Screening  May 19, 1965  . HIV Screening  09/02/1978  . PAP SMEAR  07/13/2017  . INFLUENZA VACCINE  02/23/2019 (Originally 11/21/2017)  . MAMMOGRAM  07/23/2018  . COLONOSCOPY  07/16/2021  . TETANUS/TDAP  02/27/2027    Lab Results  Component Value Date   CHOL 120 02/22/2017   HDL 68.50 02/22/2017   LDLCALC 40 02/22/2017   TRIG 57.0 02/22/2017   CHOLHDL 2 02/22/2017   Lab Results  Component Value Date   CREATININE 0.82 02/22/2017   Lab Results  Component Value Date   TSH 2.20 02/22/2017   Lab Results  Component Value Date   WBC 5.8 02/22/2017   HGB 14.6 02/22/2017   HCT 45.0 02/22/2017   MCV 95.0 02/22/2017   PLT 277.0 02/22/2017     Current Outpatient Medications on File Prior to Visit  Medication Sig Dispense Refill  . Ascorbic Acid (VITAMIN C PO) Take by mouth daily.    . calcium citrate (CALCITRATE - DOSED IN MG ELEMENTAL CALCIUM) 950 MG tablet Take 200 mg of elemental calcium by mouth daily.    . cholecalciferol (VITAMIN D) 1000 UNITS tablet Take 1,000 Units by mouth daily.    . Multiple Vitamin (MULTIVITAMIN) tablet Take 1 tablet by mouth daily.     Current Facility-Administered Medications on File Prior to Visit  Medication Dose Route Frequency Provider Last Rate Last Dose  . 0.9 %  sodium chloride infusion  500 mL Intravenous Continuous Doran Stabler, MD        Allergies  Allergen Reactions  . Nitrates, Organic Swelling    Throat closes    Past  Medical History:  Diagnosis Date  . Allergy   . Osteopenia     Past Surgical History:  Procedure Laterality Date  . WISDOM TOOTH EXTRACTION  1989    Family History  Problem Relation Age of Onset  . Arthritis Mother   . Cancer Mother   . Hyperlipidemia Mother   . Heart disease Mother   . Stroke Mother   . Hypertension Mother   . Kidney disease Mother   . Mental illness Father   . Depression Father   . Cancer Maternal Grandfather   . Colon cancer Neg Hx   . Colon polyps Neg Hx   . Esophageal cancer Neg Hx   . Rectal cancer Neg Hx   . Stomach cancer Neg Hx     Social History   Socioeconomic History  . Marital status: Married    Spouse name: Not on file  . Number of children: Not on file  . Years of education: Not on file  . Highest education level: Not on file  Occupational History  . Not on file  Social Needs  . Financial resource strain: Not on file  . Food insecurity:    Worry: Not on file    Inability: Not on file  . Transportation needs:    Medical: Not  on file    Non-medical: Not on file  Tobacco Use  . Smoking status: Never Smoker  . Smokeless tobacco: Never Used  Substance and Sexual Activity  . Alcohol use: No  . Drug use: No  . Sexual activity: Yes  Lifestyle  . Physical activity:    Days per week: Not on file    Minutes per session: Not on file  . Stress: Not on file  Relationships  . Social connections:    Talks on phone: Not on file    Gets together: Not on file    Attends religious service: Not on file    Active member of club or organization: Not on file    Attends meetings of clubs or organizations: Not on file    Relationship status: Not on file  . Intimate partner violence:    Fear of current or ex partner: Not on file    Emotionally abused: Not on file    Physically abused: Not on file    Forced sexual activity: Not on file  Other Topics Concern  . Not on file  Social History Narrative  . Not on file   The PMH, PSH, Social  History, Family History, Medications, and allergies have been reviewed in [facility], and have been updated if relevant.   Review of Systems  Constitutional: Negative.   HENT: Negative.   Eyes: Negative.   Respiratory: Negative.   Cardiovascular: Negative.   Gastrointestinal: Negative.   Endocrine: Negative.   Genitourinary: Negative.   Musculoskeletal: Negative.   Skin: Negative.   Allergic/Immunologic: Negative.   Neurological: Negative.   Hematological: Negative.   Psychiatric/Behavioral: Negative.   All other systems reviewed and are negative.      Objective:    BP 110/66   Pulse 77   Temp 98.3 F (36.8 C) (Oral)   Ht 5\' 5"  (1.651 m)   Wt 123 lb (55.8 kg)   SpO2 100%   BMI 20.47 kg/m   Wt Readings from Last 3 Encounters:  02/27/18 123 lb (55.8 kg)  02/26/17 126 lb 1.9 oz (57.2 kg)  07/02/16 129 lb 3.2 oz (58.6 kg)    Physical Exam   General:  Well-developed,well-nourished,in no acute distress; alert,appropriate and cooperative throughout examination Head:  normocephalic and atraumatic.   Eyes:  vision grossly intact, PERRL Ears:  R ear normal and L ear normal externally, TMs clear bilaterally Nose:  no external deformity.   Mouth:  good dentition.   Neck:  No deformities, masses, or tenderness noted. Breasts:  No mass, nodules, thickening, tenderness, bulging, retraction, inflamation, nipple discharge or skin changes noted.   Lungs:  Normal respiratory effort, chest expands symmetrically. Lungs are clear to auscultation, no crackles or wheezes. Heart:  Normal rate and regular rhythm. S1 and S2 normal without gallop, murmur, click, rub or other extra sounds. Abdomen:  Bowel sounds positive,abdomen soft and non-tender without masses, organomegaly or hernias noted. Msk:  No deformity or scoliosis noted of thoracic or lumbar spine.   Extremities:  No clubbing, cyanosis, edema, or deformity noted with normal full range of motion of all joints.   Neurologic:  alert &  oriented X3 and gait normal.   Skin:  Intact without suspicious lesions or rashes Cervical Nodes:  No lymphadenopathy noted Axillary Nodes:  No palpable lymphadenopathy Psych:  Cognition and judgment appear intact. Alert and cooperative with normal attention span and concentration. No apparent delusions, illusions, hallucinations        Assessment & Plan:  Well woman exam with routine gynecological exam  Need for hepatitis C screening test - Plan: Hepatitis C Antibody  Other hyperlipidemia - Plan: TSH, CBC, Comprehensive metabolic panel, Lipid panel No follow-ups on file.

## 2018-02-27 NOTE — Patient Instructions (Addendum)
Great to see you. I will call you with your lab results from today and you can view them online.   On your way out, please sing a release form to get records from Dr. Radene Knee.

## 2018-02-27 NOTE — Assessment & Plan Note (Signed)
Reviewed preventive care protocols, scheduled due services, and updated immunizations Discussed nutrition, exercise, diet, and healthy lifestyle.  Flu vaccine today.

## 2018-02-27 NOTE — Addendum Note (Signed)
Addended byShawnie Pons on: 02/27/2018 09:48 AM   Modules accepted: Orders

## 2018-02-28 LAB — HEPATITIS C ANTIBODY
Hepatitis C Ab: NONREACTIVE
SIGNAL TO CUT-OFF: 0.01 (ref <1.00)

## 2018-12-10 DIAGNOSIS — M810 Age-related osteoporosis without current pathological fracture: Secondary | ICD-10-CM | POA: Insufficient documentation

## 2019-02-04 ENCOUNTER — Ambulatory Visit (INDEPENDENT_AMBULATORY_CARE_PROVIDER_SITE_OTHER): Payer: 59 | Admitting: Behavioral Health

## 2019-02-04 DIAGNOSIS — Z23 Encounter for immunization: Secondary | ICD-10-CM | POA: Diagnosis not present

## 2019-02-04 NOTE — Progress Notes (Signed)
Patient presents in clinic today for Influenza vaccination. IM injection was given in the left deltoid. Patient tolerated the injection well. No signs or symptoms of a reaction were noted prior to patient leaving the nurse visit. 

## 2019-02-27 ENCOUNTER — Telehealth: Payer: Self-pay

## 2019-02-27 NOTE — Telephone Encounter (Signed)
Travel or Contacts:   Any travel in the past 2 weeks? NO  Have you came in contact with anyone who has Covid?NO  Have you had a positive Covid test? If so, when?NO  Fever >100.71F []   Yes [x]   No []   Unknown  Chills []   Yes [x]   No []   Unknown  Muscle aches (myalgia) []   Yes [x]   No []   Unknown  Runny nose (rhinorrhea) []   Yes [x]   No []   Unknown  Sore throat []   Yes [x]   No []   Unknown Cough (new onset or worsening of chronic cough) []   Yes [x]   No []   Unknown  Shortness of breath (dyspnea) []   Yes [x]   No []   Unknown Nausea or vomiting []   Yes [x]   No []   Unknown  Headache []   Yes [x]   No []   Unknown  Abdominal pain  []   Yes [x]   No []   Unknown  Diarrhea (?3 loose/looser than normal stools/24hr period) []   Yes [x]   No []   Unknown Other, s:pecify

## 2019-02-27 NOTE — Progress Notes (Signed)
Subjective:   Patient ID: Susan Reid, female    DOB: January 23, 1966, 53 y.o.   MRN: [id number]  Susan Reid is a pleasant 53 y.o. year old female who presents to clinic today with Annual Exam (Pt is here today for a CPE without PAP.  She sees Dr. Radene Knee at [facility]. Last PAP, Mammogram, and Dexa on file is 6.5.2019.  Colonoscopy is on 5-year-schedule and will be due in 2023.) and Follow-up (Pt is fasting for lab work today)  on 03/02/2019  HPI: G2P2- has OBGYN, Dr. Radene Knee.  Last saw him on 12/10/18.  Mammogram was done.  Per pt, pap smear, DEXA in  09/2017.  Colonoscopy 07/16/16- 5 year recall.  Already received her flu shot.  Health Maintenance  Topic Date Due  . HIV Screening  09/02/1978  . MAMMOGRAM  09/26/2019  . PAP SMEAR-Modifier  09/27/2020  . COLONOSCOPY  07/16/2021  . TETANUS/TDAP  02/27/2027  . INFLUENZA VACCINE  Completed  . Hepatitis C Screening  Completed   Depression screen [facility] 2/9 03/02/2019 02/27/2018 02/26/2017 12/21/2014  Decreased Interest 0 0 0 0  Down, Depressed, Hopeless 0 0 0 0  PHQ - 2 Score 0 0 0 0  Altered sleeping 0 1 - -  Tired, decreased energy 0 0 - -  Change in appetite 0 0 - -  Feeling bad or failure about yourself  0 0 - -  Trouble concentrating 0 1 - -  Moving slowly or fidgety/restless 0 0 - -  Suicidal thoughts 0 0 - -  PHQ-9 Score 0 2 - -  Difficult doing work/chores Not difficult at all - - -   GAD 7 : Generalized Anxiety Score 03/02/2019 02/27/2018  Nervous, Anxious, on Edge 0 1  Control/stop worrying 0 1  Worry too much - different things 0 1  Trouble relaxing 0 1  Restless 1 1  Easily annoyed or irritable 0 0  Afraid - awful might happen 0 0  Total GAD 7 Score 1 5  Anxiety Difficulty Not difficult at all -     Lab Results  Component Value Date   CHOL 160 02/27/2018   HDL 80.40 02/27/2018   LDLCALC 58 02/27/2018   TRIG 108.0 02/27/2018   CHOLHDL 2 02/27/2018   Lab Results  Component Value Date   CREATININE 0.74  02/27/2018   Lab Results  Component Value Date   TSH 1.67 02/27/2018   Lab Results  Component Value Date   WBC 4.7 02/27/2018   HGB 14.5 02/27/2018   HCT 43.9 02/27/2018   MCV 93.1 02/27/2018   PLT 294.0 02/27/2018     Current Outpatient Medications on File Prior to Visit  Medication Sig Dispense Refill  . Ascorbic Acid (VITAMIN C PO) Take by mouth daily.    . calcium citrate (CALCITRATE - DOSED IN MG ELEMENTAL CALCIUM) 950 MG tablet Take 200 mg of elemental calcium by mouth daily.    . cholecalciferol (VITAMIN D) 1000 UNITS tablet Take 1,000 Units by mouth daily.    . Multiple Vitamin (MULTIVITAMIN) tablet Take 1 tablet by mouth daily.     Current Facility-Administered Medications on File Prior to Visit  Medication Dose Route Frequency Provider Last Rate Last Dose  . 0.9 %  sodium chloride infusion  500 mL Intravenous Continuous Doran Stabler, MD        Allergies  Allergen Reactions  . Nitrates, Organic Swelling    Throat closes    Past Medical History:  Diagnosis Date  . Allergy   . Osteopenia     Past Surgical History:  Procedure Laterality Date  . WISDOM TOOTH EXTRACTION  1989    Family History  Problem Relation Age of Onset  . Arthritis Mother   . Cancer Mother   . Hyperlipidemia Mother   . Heart disease Mother   . Stroke Mother   . Hypertension Mother   . Kidney disease Mother   . Mental illness Father   . Depression Father   . Cancer Maternal Grandfather   . Colon cancer Neg Hx   . Colon polyps Neg Hx   . Esophageal cancer Neg Hx   . Rectal cancer Neg Hx   . Stomach cancer Neg Hx     Social History   Socioeconomic History  . Marital status: Married    Spouse name: Not on file  . Number of children: Not on file  . Years of education: Not on file  . Highest education level: Not on file  Occupational History  . Not on file  Social Needs  . Financial resource strain: Not on file  . Food insecurity    Worry: Not on file     Inability: Not on file  . Transportation needs    Medical: Not on file    Non-medical: Not on file  Tobacco Use  . Smoking status: Never Smoker  . Smokeless tobacco: Never Used  Substance and Sexual Activity  . Alcohol use: No  . Drug use: No  . Sexual activity: Yes  Lifestyle  . Physical activity    Days per week: Not on file    Minutes per session: Not on file  . Stress: Not on file  Relationships  . Social Herbalist on phone: Not on file    Gets together: Not on file    Attends religious service: Not on file    Active member of club or organization: Not on file    Attends meetings of clubs or organizations: Not on file    Relationship status: Not on file  . Intimate partner violence    Fear of current or ex partner: Not on file    Emotionally abused: Not on file    Physically abused: Not on file    Forced sexual activity: Not on file  Other Topics Concern  . Not on file  Social History Narrative  . Not on file   The PMH, PSH, Social History, Family History, Medications, and allergies have been reviewed in [facility], and have been updated if relevant.   Review of Systems  Constitutional: Negative.   HENT: Negative.   Eyes: Negative.   Respiratory: Negative.   Cardiovascular: Negative.   Gastrointestinal: Negative.   Endocrine: Negative.   Genitourinary: Negative.   Musculoskeletal: Negative.   Skin: Negative.   Allergic/Immunologic: Negative.   Neurological: Negative.   Hematological: Negative.   Psychiatric/Behavioral: Negative.   All other systems reviewed and are negative.      Objective:    BP 110/78 (BP Location: Left Arm, Patient Position: Sitting, Cuff Size: Normal)   Pulse 63   Temp 98.3 F (36.8 C) (Oral)   Ht 5' 4.75" (1.645 m)   Wt 130 lb (59 kg)   LMP 12/20/2017   SpO2 98%   BMI 21.80 kg/m   Wt Readings from Last 3 Encounters:  03/02/19 130 lb (59 kg)  02/27/18 123 lb (55.8 kg)  02/26/17 126 lb 1.9 oz (57.2 kg)  Physical  Exam   General:  Well-developed,well-nourished,in no acute distress; alert,appropriate and cooperative throughout examination Head:  normocephalic and atraumatic.   Eyes:  vision grossly intact, PERRL Ears:  R ear normal and L ear normal externally, TMs clear bilaterally Nose:  no external deformity.   Mouth:  good dentition.   Neck:  No deformities, masses, or tenderness noted. Breasts:  No mass, nodules, thickening, tenderness, bulging, retraction, inflamation, nipple discharge or skin changes noted.   Lungs:  Normal respiratory effort, chest expands symmetrically. Lungs are clear to auscultation, no crackles or wheezes. Heart:  Normal rate and regular rhythm. S1 and S2 normal without gallop, murmur, click, rub or other extra sounds. Abdomen:  Bowel sounds positive,abdomen soft and non-tender without masses, organomegaly or hernias noted. Msk:  No deformity or scoliosis noted of thoracic or lumbar spine.   Extremities:  No clubbing, cyanosis, edema, or deformity noted with normal full range of motion of all joints.   Neurologic:  alert & oriented X3 and gait normal.   Skin:  Intact without suspicious lesions or rashes Cervical Nodes:  No lymphadenopathy noted Axillary Nodes:  No palpable lymphadenopathy Psych:  Cognition and judgment appear intact. Alert and cooperative with normal attention span and concentration. No apparent delusions, illusions, hallucinations        Assessment & Plan:   Well woman exam without gynecological exam - Plan: Comp Met (CMET), CBC w/Diff, Lipid Profile, TSH  Other hyperlipidemia - Plan: Comp Met (CMET), CBC w/Diff, Lipid Profile, TSH No follow-ups on file.

## 2019-03-02 ENCOUNTER — Ambulatory Visit (INDEPENDENT_AMBULATORY_CARE_PROVIDER_SITE_OTHER): Payer: 59 | Admitting: Family Medicine

## 2019-03-02 ENCOUNTER — Other Ambulatory Visit: Payer: Self-pay

## 2019-03-02 ENCOUNTER — Encounter: Payer: Self-pay | Admitting: Family Medicine

## 2019-03-02 VITALS — BP 110/78 | HR 63 | Temp 98.3°F | Ht 64.75 in | Wt 130.0 lb

## 2019-03-02 DIAGNOSIS — E7849 Other hyperlipidemia: Secondary | ICD-10-CM | POA: Diagnosis not present

## 2019-03-02 DIAGNOSIS — Z0000 Encounter for general adult medical examination without abnormal findings: Secondary | ICD-10-CM

## 2019-03-02 LAB — LIPID PANEL
Cholesterol: 152 mg/dL (ref 0–200)
HDL: 83.1 mg/dL (ref >39.00)
LDL Cholesterol: 56 mg/dL (ref 0–99)
NonHDL: 68.74
Total CHOL/HDL Ratio: 2
Triglycerides: 66 mg/dL (ref 0.0–149.0)
VLDL: 13.2 mg/dL (ref 0.0–40.0)

## 2019-03-02 LAB — CBC WITH DIFFERENTIAL/PLATELET
Basophils Absolute: 0 10*3/uL (ref 0.0–0.1)
Basophils Relative: 0.9 % (ref 0.0–3.0)
Eosinophils Absolute: 0.1 10*3/uL (ref 0.0–0.7)
Eosinophils Relative: 2.8 % (ref 0.0–5.0)
HCT: 41.9 % (ref 36.0–46.0)
Hemoglobin: 13.7 g/dL (ref 12.0–15.0)
Lymphocytes Relative: 33 % (ref 12.0–46.0)
Lymphs Abs: 1.3 10*3/uL (ref 0.7–4.0)
MCHC: 32.7 g/dL (ref 30.0–36.0)
MCV: 93 fl (ref 78.0–100.0)
Monocytes Absolute: 0.4 10*3/uL (ref 0.1–1.0)
Monocytes Relative: 8.6 % (ref 3.0–12.0)
Neutro Abs: 2.2 10*3/uL (ref 1.4–7.7)
Neutrophils Relative %: 54.7 % (ref 43.0–77.0)
Platelets: 281 10*3/uL (ref 150.0–400.0)
RBC: 4.51 Mil/uL (ref 3.87–5.11)
RDW: 13.2 % (ref 11.5–15.5)
WBC: 4.1 10*3/uL (ref 4.0–10.5)

## 2019-03-02 LAB — COMPREHENSIVE METABOLIC PANEL
ALT: 20 U/L (ref 0–35)
AST: 20 U/L (ref 0–37)
Albumin: 4.6 g/dL (ref 3.5–5.2)
Alkaline Phosphatase: 94 U/L (ref 39–117)
BUN: 15 mg/dL (ref 6–23)
CO2: 30 mEq/L (ref 19–32)
Calcium: 9.5 mg/dL (ref 8.4–10.5)
Chloride: 104 mEq/L (ref 96–112)
Creatinine, Ser: 0.73 mg/dL (ref 0.40–1.20)
GFR: 82.62 mL/min (ref >60.00)
Glucose, Bld: 96 mg/dL (ref 70–99)
Potassium: 3.9 mEq/L (ref 3.5–5.1)
Sodium: 141 mEq/L (ref 135–145)
Total Bilirubin: 0.6 mg/dL (ref 0.2–1.2)
Total Protein: 6.8 g/dL (ref 6.0–8.3)

## 2019-03-02 LAB — TSH: TSH: 1.58 u[IU]/mL (ref 0.35–4.50)

## 2019-03-02 NOTE — Patient Instructions (Signed)
Great to see you. I will call you with your lab results from today and you can view them online.   Say hi to [facility] for me!

## 2019-03-02 NOTE — Assessment & Plan Note (Signed)
Reviewed preventive care protocols, scheduled due services, and updated immunizations Discussed nutrition, exercise, diet, and healthy lifestyle.  

## 2019-07-15 ENCOUNTER — Encounter: Payer: Self-pay | Admitting: Family Medicine

## 2019-07-23 ENCOUNTER — Ambulatory Visit: Payer: 59 | Attending: Family

## 2019-07-23 DIAGNOSIS — Z23 Encounter for immunization: Secondary | ICD-10-CM

## 2019-07-23 NOTE — Progress Notes (Addendum)
   Covid-19 Vaccination Clinic  Name:  Susan Reid    MRN: [id number] DOB: [birth year]  07/23/2019  [name] was observed post Covid-19 immunization for30 min due to Hx, not 15 minutes without incident. She was provided with Vaccine Information Sheet and instruction to access the V-Safe system.   [name] was instructed to call 911 with any severe reactions post vaccine: Marland Kitchen Difficulty breathing  . Swelling of face and throat  . A fast heartbeat  . A bad rash all over body  . Dizziness and weakness   Immunizations Administered    Name Date Dose VIS Date Route   Moderna COVID-19 Vaccine 07/23/2019  2:56 PM 0.5 mL 03/24/2019 Intramuscular   Manufacturer: Moderna   Lot: [id number]   [id number]

## 2019-08-20 ENCOUNTER — Ambulatory Visit: Payer: 59 | Attending: Family

## 2019-08-20 DIAGNOSIS — Z23 Encounter for immunization: Secondary | ICD-10-CM

## 2019-08-20 NOTE — Progress Notes (Signed)
   Covid-19 Vaccination Clinic  Name:  Susan Reid    MRN: [id number] DOB: 1965-07-10  08/20/2019  Susan Reid was observed post Covid-19 immunization for 15 minutes without incident. She was provided with Vaccine Information Sheet and instruction to access the V-Safe system.   Susan Reid was instructed to call 911 with any severe reactions post vaccine: Marland Kitchen Difficulty breathing  . Swelling of face and throat  . A fast heartbeat  . A bad rash all over body  . Dizziness and weakness   Immunizations Administered    Name Date Dose VIS Date Route   Moderna COVID-19 Vaccine 08/20/2019  1:51 PM 0.5 mL 03/2019 Intramuscular   Manufacturer: Moderna   Lot: [id number]   [id number]

## 2019-08-25 ENCOUNTER — Ambulatory Visit: Payer: 59

## 2019-10-28 ENCOUNTER — Encounter: Payer: Self-pay | Admitting: Family Medicine

## 2019-10-28 ENCOUNTER — Other Ambulatory Visit: Payer: Self-pay

## 2019-10-28 ENCOUNTER — Ambulatory Visit: Payer: 59 | Admitting: Family Medicine

## 2019-10-28 DIAGNOSIS — M8580 Other specified disorders of bone density and structure, unspecified site: Secondary | ICD-10-CM

## 2019-10-28 NOTE — Assessment & Plan Note (Signed)
Continue supplements and DEXA per GYN. Cont regular exercise. Appreciate GYN support

## 2019-10-28 NOTE — Progress Notes (Signed)
   Subjective:     Susan Reid is a 54 y.o. female presenting for Establish Care     HPI  #Osteopenia - diagnosed in her 29s  - follows with GYN - taking supplement    Review of Systems   Social History   Tobacco Use  Smoking Status Never Smoker  Smokeless Tobacco Never Used        Objective:    BP Readings from Last 3 Encounters:  10/28/19 110/70  03/02/19 110/78  02/27/18 110/66   Wt Readings from Last 3 Encounters:  10/28/19 122 lb (55.3 kg)  03/02/19 130 lb (59 kg)  02/27/18 123 lb (55.8 kg)    BP 110/70   Pulse 66   Temp (!) 97.5 F (36.4 C) (Temporal)   Ht 5\' 5"  (1.651 m)   Wt 122 lb (55.3 kg)   LMP 12/20/2017   SpO2 98%   BMI 20.30 kg/m    Physical Exam Constitutional:      General: She is not in acute distress.    Appearance: She is well-developed. She is not diaphoretic.  HENT:     Right Ear: External ear normal.     Left Ear: External ear normal.  Eyes:     Conjunctiva/sclera: Conjunctivae normal.  Cardiovascular:     Rate and Rhythm: Normal rate.  Pulmonary:     Effort: Pulmonary effort is normal.  Musculoskeletal:     Cervical back: Neck supple.  Skin:    General: Skin is warm and dry.     Capillary Refill: Capillary refill takes less than 2 seconds.  Neurological:     Mental Status: She is alert. Mental status is at baseline.  Psychiatric:        Mood and Affect: Mood normal.        Behavior: Behavior normal.      The 10-year ASCVD risk score Mikey Bussing DC Jr., et al., 2013) is: 0.9%   Values used to calculate the score:     Age: 8 years     Sex: Female     Is Non-Hispanic African American: No     Diabetic: No     Tobacco smoker: No     Systolic Blood Pressure: 446 mmHg     Is BP treated: No     HDL Cholesterol: 83.1 mg/dL     Total Cholesterol: 152 mg/dL      Assessment & Plan:   Problem List Items Addressed This Visit      Musculoskeletal and Integument   Osteopenia    Continue supplements and DEXA per  GYN. Cont regular exercise. Appreciate GYN support          Return in about 1 year (around 10/27/2020) for annual.  Lesleigh Noe, MD  This visit occurred during the SARS-CoV-2 public health emergency.  Safety protocols were in place, including screening questions prior to the visit, additional usage of staff PPE, and extensive cleaning of exam room while observing appropriate contact time as indicated for disinfecting solutions.

## 2019-10-28 NOTE — Patient Instructions (Signed)
Return for annual in November or next year - up to you

## 2020-01-20 ENCOUNTER — Other Ambulatory Visit: Payer: Self-pay | Admitting: Obstetrics and Gynecology

## 2020-01-20 DIAGNOSIS — Z9189 Other specified personal risk factors, not elsewhere classified: Secondary | ICD-10-CM

## 2020-02-15 ENCOUNTER — Other Ambulatory Visit: Payer: Self-pay

## 2020-02-15 ENCOUNTER — Ambulatory Visit
Admission: RE | Admit: 2020-02-15 | Discharge: 2020-02-15 | Disposition: A | Discharge status: Home / Self Care | Payer: 59 | Source: Ambulatory Visit | Attending: Obstetrics and Gynecology | Admitting: Obstetrics and Gynecology

## 2020-02-15 DIAGNOSIS — Z9189 Other specified personal risk factors, not elsewhere classified: Secondary | ICD-10-CM

## 2020-02-15 MED ORDER — GADOBUTROL 1 MMOL/ML IV SOLN
6.0000 mL | Freq: Once | INTRAVENOUS | Status: AC | PRN
  Administered 2020-02-15: 6 mL via INTRAVENOUS

## 2020-02-16 ENCOUNTER — Other Ambulatory Visit: Payer: Self-pay | Admitting: Obstetrics and Gynecology

## 2020-02-16 DIAGNOSIS — R9389 Abnormal findings on diagnostic imaging of other specified body structures: Secondary | ICD-10-CM

## 2020-02-25 ENCOUNTER — Other Ambulatory Visit: Payer: Self-pay

## 2020-02-25 ENCOUNTER — Ambulatory Visit
Admission: RE | Admit: 2020-02-25 | Discharge: 2020-02-25 | Disposition: A | Discharge status: Home / Self Care | Payer: 59 | Source: Ambulatory Visit | Attending: Obstetrics and Gynecology | Admitting: Obstetrics and Gynecology

## 2020-02-25 DIAGNOSIS — R9389 Abnormal findings on diagnostic imaging of other specified body structures: Secondary | ICD-10-CM

## 2020-02-25 HISTORY — PX: BREAST BIOPSY: SHX20

## 2020-02-25 MED ORDER — GADOBUTROL 1 MMOL/ML IV SOLN
6.0000 mL | Freq: Once | INTRAVENOUS | Status: AC | PRN
  Administered 2020-02-25: 6 mL via INTRAVENOUS

## 2020-03-07 ENCOUNTER — Ambulatory Visit (INDEPENDENT_AMBULATORY_CARE_PROVIDER_SITE_OTHER): Payer: 59 | Admitting: Family Medicine

## 2020-03-07 ENCOUNTER — Other Ambulatory Visit: Payer: Self-pay

## 2020-03-07 VITALS — BP 110/70 | HR 77 | Temp 97.8°F | Ht 65.75 in | Wt 124.5 lb

## 2020-03-07 DIAGNOSIS — Z0000 Encounter for general adult medical examination without abnormal findings: Secondary | ICD-10-CM | POA: Diagnosis not present

## 2020-03-07 NOTE — Patient Instructions (Signed)
Your DEXA showed osteopenia.   This means that she is at risk for developing osteoporosis and have some signs of low bone mass.   Would recommend the following:   1) 800 units of Vitamin D daily 2) Get 1200 mg of elemental calcium --- this is best from your diet. Try to track how much calcium you get on a typical day. You could find ways to add more (dairy products, leafy greens). Take a supplement for whatever you don't typically get so you reach 1200 mg of calcium.  3) Physical activity (ideally weight bearing) - like walking briskly 30 minutes 5 days a week.   We can plan to repeat in 3-5 years prett

## 2020-03-07 NOTE — Progress Notes (Signed)
Annual Exam   Chief Complaint:  Chief Complaint  Patient presents with  . Annual Exam    no concerns/ will get flu shot in near future    History of Present Illness:  [name]. No obstetric history on file. who LMP was Patient's last menstrual period was 12/20/2017., presents today for her annual examination.     Nutrition She does get adequate calcium and Vitamin D in her diet. Diet: healthy Exercise: walking - 4 days a week  Safety The patient wears seatbelts: yes.     The patient feels safe at home and in their relationships: yes.   Menstrual:  Symptoms of menopause: rare hot flashes  GYN She is single partner, contraception - post menopausal status.    Cervical Cancer Screening (21-65):   Last Pap:  2019 Results were: no abnormalities /neg HPV DNA   Breast Cancer Screening (Age 73-74):  There is FH of breast cancer. There is no FH of ovarian cancer. BRCA screening Not Indicated.  Last Mammogram: 12/2019 The patient does want a mammogram this year.    Colon Cancer Screening:  Age 19-75 yo - benefits outweigh the risk. Adults 16-85 yo who have never been screened benefit.  Benefits: 134000 people in 2016 will be diagnosed and 49,000 will die - early detection helps Harms: Complications 2/2 to colonoscopy High Risk (Colonoscopy): genetic disorder (Lynch syndrome or familial adenomatous polyposis), personal hx of IBD, previous adenomatous polyp, or previous colorectal cancer, FamHx start 10 years before the age at diagnosis, increased in males and black race  Options:  FIT - looks for hemoglobin (blood in the stool) - specific and fairly sensitive - must be done annually Cologuard - looks for DNA and blood - more sensitive - therefore can have more false positives, every 3 years Colonoscopy - every 10 years if normal - sedation, bowl prep, must have someone drive you  Shared decision making and the patient had decided to do colonoscopy - due tin  2023.   Social History   Tobacco Use  Smoking Status Never Smoker  Smokeless Tobacco Never Used    Lung Cancer Screening (Ages 00-93): not applicable   Weight Wt Readings from Last 3 Encounters:  03/07/20 124 lb 8 oz (56.5 kg)  10/28/19 122 lb (55.3 kg)  03/02/19 130 lb (59 kg)   Patient has normal BMI  BMI Readings from Last 1 Encounters:  03/07/20 20.25 kg/m     Chronic disease screening Blood pressure monitoring:  BP Readings from Last 3 Encounters:  03/07/20 110/70  10/28/19 110/70  03/02/19 110/78    Lipid Monitoring: Indication for screening: age >17, obesity, diabetes, family hx, CV risk factors.  Lipid screening: Yes  Lab Results  Component Value Date   CHOL 152 03/02/2019   HDL 83.10 03/02/2019   LDLCALC 56 03/02/2019   TRIG 66.0 03/02/2019   CHOLHDL 2 03/02/2019     Diabetes Screening: age >10, overweight, family hx, PCOS, hx of gestational diabetes, at risk ethnicity Diabetes Screening screening: Yes  No results found for: HGBA1C   Past Medical History:  Diagnosis Date  . Allergy   . Osteopenia     Past Surgical History:  Procedure Laterality Date  . [place]    Prior to Admission medications   Medication Sig Start Date End Date Taking? Authorizing Provider  Ascorbic Acid (VITAMIN C PO) Take by mouth daily.   Yes [provider]  calcium citrate (CALCITRATE -  DOSED IN MG ELEMENTAL CALCIUM) 950 MG tablet Take 200 mg of elemental calcium by mouth daily.   Yes [provider]  cholecalciferol (VITAMIN D) 1000 UNITS tablet Take 1,000 Units by mouth daily.   Yes [provider]  Multiple Vitamin (MULTIVITAMIN) tablet Take 1 tablet by mouth daily.   Yes [provider]    Allergies  Allergen Reactions  . Nitrates, Organic Swelling    Throat closes    Gynecologic History: Patient's last menstrual period was 12/20/2017.  Obstetric History: No obstetric history on file.  Social  History   Socioeconomic History  . Marital status: Married    Spouse name: [name]  . Number of children: 2  . Years of education: High school  . Highest education level: Not on file  Occupational History  . Not on file  Tobacco Use  . Smoking status: Never Smoker  . Smokeless tobacco: Never Used  Substance and Sexual Activity  . Alcohol use: Yes    Comment: once a month or less  . Drug use: No  . Sexual activity: Yes    Birth control/protection: Post-menopausal  Other Topics Concern  . Not on file  Social History Narrative   10/28/19   From: the area   Living: with Husband, Don   Work: Librarian, academic - Tourist information centre manager      Family: 2 children - Psychologist, educational (nurse at [employer]) and [name] (family medicine resident in [place]) - both married 2020 and 2021      Enjoys: work outside in the yard, hiking      Exercise: walking daily, was going to the gym   Diet: diabetic diet - avoiding added sugar      Safety   Seat belts: Yes    Guns: Yes  and secure   Safe in relationships: Yes    Social Determinants of Health   Financial Resource Strain:   . Difficulty of Paying Living Expenses: Not on file  Food Insecurity:   . Worried About Charity fundraiser in the Last Year: Not on file  . Ran Out of Food in the Last Year: Not on file  Transportation Needs:   . Lack of Transportation (Medical): Not on file  . Lack of Transportation (Non-Medical): Not on file  Physical Activity:   . Days of Exercise per Week: Not on file  . Minutes of Exercise per Session: Not on file  Stress:   . Feeling of Stress : Not on file  Social Connections:   . Frequency of Communication with Friends and Family: Not on file  . Frequency of Social Gatherings with Friends and Family: Not on file  . Attends Religious Services: Not on file  . Active Member of Clubs or Organizations: Not on file  . Attends Archivist Meetings: Not on file  . Marital Status: Not on file  Intimate  Partner Violence:   . Fear of Current or Ex-Partner: Not on file  . Emotionally Abused: Not on file  . Physically Abused: Not on file  . Sexually Abused: Not on file    Family History  Problem Relation Age of Onset  . Arthritis Mother   . Hyperlipidemia Mother   . Heart disease Mother   . Stroke Mother 57  . Hypertension Mother   . Kidney disease Mother   . Breast cancer Mother   . Cirrhosis Mother   . Mental illness Father   . Depression Father   . Cancer  Maternal Grandfather        Oral  - smoker  . Lung cancer Maternal Grandfather   . Cancer Paternal Grandmother        unknown type  . Colon cancer Neg Hx   . Colon polyps Neg Hx   . Esophageal cancer Neg Hx   . Rectal cancer Neg Hx   . Stomach cancer Neg Hx     Review of Systems  Constitutional: Negative for chills and fever.  HENT: Negative for congestion and sore throat.   Eyes: Negative for blurred vision and double vision.  Respiratory: Negative for shortness of breath.   Cardiovascular: Negative for chest pain.  Gastrointestinal: Negative for heartburn, nausea and vomiting.  Genitourinary: Negative.   Musculoskeletal: Negative.  Negative for myalgias.  Skin: Negative for rash.  Neurological: Negative for dizziness and headaches.  Endo/Heme/Allergies: Does not bruise/bleed easily.  Psychiatric/Behavioral: Negative for depression. The patient is not nervous/anxious.      Physical Exam BP 110/70   Pulse 77   Temp 97.8 F (36.6 C) (Temporal)   Ht 5' 5.75" (1.67 m)   Wt 124 lb 8 oz (56.5 kg)   LMP 12/20/2017   SpO2 100%   BMI 20.25 kg/m    BP Readings from Last 3 Encounters:  03/07/20 110/70  10/28/19 110/70  03/02/19 110/78      Physical Exam Constitutional:      General: She is not in acute distress.    Appearance: She is well-developed. She is not diaphoretic.  HENT:     Head: Normocephalic and atraumatic.     Right Ear: External ear normal.     Left Ear: External ear normal.     Nose:  Nose normal.  Eyes:     General: No scleral icterus.    Conjunctiva/sclera: Conjunctivae normal.  Cardiovascular:     Rate and Rhythm: Normal rate and regular rhythm.     Heart sounds: No murmur heard.   Pulmonary:     Effort: Pulmonary effort is normal. No respiratory distress.     Breath sounds: Normal breath sounds. No wheezing.  Abdominal:     General: Bowel sounds are normal. There is no distension.     Palpations: Abdomen is soft. There is no mass.     Tenderness: There is no abdominal tenderness. There is no guarding or rebound.  Musculoskeletal:        General: Normal range of motion.     Cervical back: Neck supple.  Lymphadenopathy:     Cervical: No cervical adenopathy.  Skin:    General: Skin is warm and dry.     Capillary Refill: Capillary refill takes less than 2 seconds.  Neurological:     Mental Status: She is alert and oriented to person, place, and time.     Deep Tendon Reflexes: Reflexes normal.  Psychiatric:        Behavior: Behavior normal.     Results:  PHQ-9:    Office Visit from 03/02/2019 in [facility]  PHQ-9 Total Score 0        Assessment: 54 y.o. No obstetric history on file. female here for routine annual physical examination.  Plan: Problem List Items Addressed This Visit    None    Visit Diagnoses    Annual physical exam    -  Primary   Relevant Orders   CBC with Differential   Comprehensive metabolic panel   Lipid panel      Screening: -- Blood  pressure screen normal -- cholesterol screening: will obtain -- Weight screening: normal -- Diabetes Screening: will obtain -- Nutrition: Encouraged healthy diet  The 10-year ASCVD risk score Mikey Bussing DC Jr., et al., 2013) is: 0.9%   Values used to calculate the score:     Age: 33 years     Sex: Female     Is Non-Hispanic African American: No     Diabetic: No     Tobacco smoker: No     Systolic Blood Pressure: 828 mmHg     Is BP treated: No     HDL  Cholesterol: 83.1 mg/dL     Total Cholesterol: 152 mg/dL  -- Statin therapy for Age 61-75 with CVD risk >7.5%  Psych -- Depression screening (PHQ-9):    Office Visit from 03/02/2019 in [facility]  PHQ-9 Total Score 0       Safety -- tobacco screening: not using -- alcohol screening:  low-risk usage. -- no evidence of domestic violence or intimate partner violence.   Cancer Screening -- pap smear not collected per ASCCP guidelines -- family history of breast cancer screening: done. not at high risk. -- Mammogram - up to date -- Colon cancer (age 23+)-- up to date  Immunizations Immunization History  Administered Date(s) Administered  . Influenza,inj,Quad PF,6+ Mos 02/27/2018, 02/04/2019  . Moderna SARS-COVID-2 Vaccination 07/23/2019, 08/20/2019  . Tdap 02/26/2017    -- flu vaccine - recent biopsy she will get it at a pharmacy -- TDAP q10 years up to date -- Shingles (age >48) - she will check insurance -- Covid-19 Vaccine up to date   Encouraged healthy diet and exercise. Encouraged regular vision and dental care.    Lesleigh Noe, MD

## 2020-03-08 LAB — COMPREHENSIVE METABOLIC PANEL
ALT: 20 U/L (ref 0–35)
AST: 23 U/L (ref 0–37)
Albumin: 4.8 g/dL (ref 3.5–5.2)
Alkaline Phosphatase: 88 U/L (ref 39–117)
BUN: 12 mg/dL (ref 6–23)
CO2: 33 mEq/L — ABNORMAL HIGH (ref 19–32)
Calcium: 9.8 mg/dL (ref 8.4–10.5)
Chloride: 101 mEq/L (ref 96–112)
Creatinine, Ser: 0.94 mg/dL (ref 0.40–1.20)
GFR: 67.83 mL/min (ref >60.00)
Glucose, Bld: 94 mg/dL (ref 70–99)
Potassium: 4.5 mEq/L (ref 3.5–5.1)
Sodium: 140 mEq/L (ref 135–145)
Total Bilirubin: 0.4 mg/dL (ref 0.2–1.2)
Total Protein: 7.4 g/dL (ref 6.0–8.3)

## 2020-03-08 LAB — LIPID PANEL
Cholesterol: 162 mg/dL (ref 0–200)
HDL: 82.3 mg/dL (ref >39.00)
LDL Cholesterol: 41 mg/dL (ref 0–99)
NonHDL: 79.25
Total CHOL/HDL Ratio: 2
Triglycerides: 189 mg/dL — ABNORMAL HIGH (ref 0.0–149.0)
VLDL: 37.8 mg/dL (ref 0.0–40.0)

## 2020-03-08 LAB — CBC WITH DIFFERENTIAL/PLATELET
Basophils Absolute: 0 10*3/uL (ref 0.0–0.1)
Basophils Relative: 0.7 % (ref 0.0–3.0)
Eosinophils Absolute: 0.1 10*3/uL (ref 0.0–0.7)
Eosinophils Relative: 1.8 % (ref 0.0–5.0)
HCT: 44 % (ref 36.0–46.0)
Hemoglobin: 14.6 g/dL (ref 12.0–15.0)
Lymphocytes Relative: 25 % (ref 12.0–46.0)
Lymphs Abs: 1.6 10*3/uL (ref 0.7–4.0)
MCHC: 33.1 g/dL (ref 30.0–36.0)
MCV: 90.9 fl (ref 78.0–100.0)
Monocytes Absolute: 0.4 10*3/uL (ref 0.1–1.0)
Monocytes Relative: 6.9 % (ref 3.0–12.0)
Neutro Abs: 4.1 10*3/uL (ref 1.4–7.7)
Neutrophils Relative %: 65.6 % (ref 43.0–77.0)
Platelets: 290 10*3/uL (ref 150.0–400.0)
RBC: 4.84 Mil/uL (ref 3.87–5.11)
RDW: 12.9 % (ref 11.5–15.5)
WBC: 6.3 10*3/uL (ref 4.0–10.5)

## 2020-11-08 ENCOUNTER — Other Ambulatory Visit: Payer: Self-pay | Admitting: Obstetrics and Gynecology

## 2020-11-08 DIAGNOSIS — Z1231 Encounter for screening mammogram for malignant neoplasm of breast: Secondary | ICD-10-CM

## 2021-02-02 ENCOUNTER — Other Ambulatory Visit: Payer: Self-pay

## 2021-02-02 ENCOUNTER — Ambulatory Visit (INDEPENDENT_AMBULATORY_CARE_PROVIDER_SITE_OTHER): Payer: BC Managed Care – PPO | Admitting: Family Medicine

## 2021-02-02 ENCOUNTER — Encounter: Payer: Self-pay | Admitting: Family Medicine

## 2021-02-02 VITALS — BP 120/80 | HR 74 | Temp 97.0°F | Ht 64.5 in | Wt 126.5 lb

## 2021-02-02 DIAGNOSIS — Z0000 Encounter for general adult medical examination without abnormal findings: Secondary | ICD-10-CM

## 2021-02-02 DIAGNOSIS — Z114 Encounter for screening for human immunodeficiency virus [HIV]: Secondary | ICD-10-CM

## 2021-02-02 NOTE — Progress Notes (Signed)
Annual Exam   Chief Complaint:  Chief Complaint  Patient presents with   Annual Exam    No concerns. Will get flu shot at end of month    History of Present Illness:  [name] is a 55 y.o. No obstetric history on file. who LMP was Patient's last menstrual period was 12/20/2017., presents today for her annual examination.     Nutrition She does get adequate calcium and Vitamin D in her diet. Diet: tries to eat healthy, limits fried foods Exercise: walking and hiking  Safety The patient wears seatbelts: yes.     The patient feels safe at home and in their relationships: yes.   Menstrual:  Symptoms of menopause: no  GYN She is single partner, contraception - post menopausal status.    Cervical Cancer Screening (21-65):   Last Pap:   June 2019 Results were: no abnormalities /neg HPV DNA   Breast Cancer Screening (Age 75-74):  There is FH of breast cancer. There is no FH of ovarian cancer. BRCA screening Not Indicated.  Last Mammogram: 02/2020 The patient does want a mammogram this year.    Colon Cancer Screening:  Age 14-75 yo - benefits outweigh the risk. Adults 34-85 yo who have never been screened benefit.  Benefits: 134000 people in 2016 will be diagnosed and 49,000 will die - early detection helps Harms: Complications 2/2 to colonoscopy High Risk (Colonoscopy): genetic disorder (Lynch syndrome or familial adenomatous polyposis), personal hx of IBD, previous adenomatous polyp, or previous colorectal cancer, FamHx start 10 years before the age at diagnosis, increased in males and black race  Options:  FIT - looks for hemoglobin (blood in the stool) - specific and fairly sensitive - must be done annually Cologuard - looks for DNA and blood - more sensitive - therefore can have more false positives, every 3 years Colonoscopy - every 10 years if normal - sedation, bowl prep, must have someone drive you  Shared decision making and the patient had decided to do  colonoscopy - due 2023.   Social History   Tobacco Use  Smoking Status Never  Smokeless Tobacco Never    Lung Cancer Screening (Ages 82-42): not applicable   Weight Wt Readings from Last 3 Encounters:  02/02/21 126 lb 8 oz (57.4 kg)  03/07/20 124 lb 8 oz (56.5 kg)  10/28/19 122 lb (55.3 kg)   Patient has normal BMI  BMI Readings from Last 1 Encounters:  02/02/21 21.38 kg/m     Chronic disease screening Blood pressure monitoring:  BP Readings from Last 3 Encounters:  02/02/21 120/80  03/07/20 110/70  10/28/19 110/70    Lipid Monitoring: Indication for screening: age >54, obesity, diabetes, family hx, CV risk factors.  Lipid screening: Yes  Lab Results  Component Value Date   CHOL 162 03/07/2020   HDL 82.30 03/07/2020   LDLCALC 41 03/07/2020   TRIG 189.0 (H) 03/07/2020   CHOLHDL 2 03/07/2020     Diabetes Screening: age >43, overweight, family hx, PCOS, hx of gestational diabetes, at risk ethnicity Diabetes Screening screening: Yes  No results found for: HGBA1C   Past Medical History:  Diagnosis Date   Allergy    Osteopenia     Past Surgical History:  Procedure Laterality Date   [place]    Prior to Admission medications   Medication Sig Start Date End Date Taking? Authorizing Provider  Ascorbic Acid (VITAMIN C PO) Take 500 mg by mouth daily.   Yes [provider]  cholecalciferol (VITAMIN D) 1000 UNITS tablet Take 1,000 Units by mouth daily.   Yes [provider]  Multiple Vitamin (MULTIVITAMIN) tablet Take 1 tablet by mouth daily.   Yes [provider]    Allergies  Allergen Reactions   Nitrates, Organic Swelling    Throat closes    Gynecologic History: Patient's last menstrual period was 12/20/2017.  Obstetric History: No obstetric history on file.  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      Family: 2 children - Psychologist, educational (nurse at [employer]) and Sport and exercise psychologist (family medicine resident in [place]) - both married 2020 and 2021      Enjoys: work outside in the yard, hiking      Exercise: walking daily, was going to the gym   Diet: diabetic diet - avoiding added sugar      Safety   Seat belts: Yes    Guns: Yes  and secure   Safe in relationships: Yes    Social Determinants of Radio broadcast assistant Strain: Not on file  Food Insecurity: Not on file  Transportation Needs: Not on file  Physical Activity: Not on file  Stress: Not on file  Social Connections: Not on file  Intimate Partner Violence: Not on file    Family History  Problem Relation Age of Onset   Arthritis Mother    Hyperlipidemia Mother    Heart disease Mother    Stroke Mother 19   Hypertension Mother    Kidney disease Mother    Breast cancer Mother    Cirrhosis Mother    Mental illness Father    Depression Father    Cancer Maternal Grandfather        Oral  - smoker   Lung cancer Maternal Grandfather    Cancer Paternal Grandmother        unknown type   Colon cancer Neg Hx    Colon polyps Neg Hx    Esophageal cancer Neg Hx    Rectal cancer Neg Hx    Stomach cancer Neg Hx     Review of Systems  Constitutional:  Negative for chills and fever.  HENT:  Negative for congestion and sore throat.   Eyes:  Negative for blurred vision and double vision.  Respiratory:  Negative for shortness of breath.    Cardiovascular:  Negative for chest pain.  Gastrointestinal:  Negative for heartburn, nausea and vomiting.  Genitourinary: Negative.   Musculoskeletal: Negative.  Negative for myalgias.  Skin:  Negative for rash.  Neurological:  Negative for dizziness and headaches.  Endo/Heme/Allergies:  Does not bruise/bleed easily.  Psychiatric/Behavioral:  Negative for depression. The patient is not nervous/anxious.     Physical Exam BP 120/80   Pulse 74   Temp (!)  97 F (36.1 C) (Temporal)   Ht 5' 4.5" (1.638 m)   Wt 126 lb 8 oz (57.4 kg)   LMP 12/20/2017   SpO2 98%   BMI 21.38 kg/m    BP Readings from Last 3 Encounters:  02/02/21 120/80  03/07/20 110/70  10/28/19 110/70      Physical Exam Constitutional:      General: She is not in acute distress.    Appearance: She is well-developed. She is not diaphoretic.  HENT:     Head: Normocephalic and atraumatic.     Right Ear: External ear normal.     Left Ear: External ear normal.     Nose: Nose normal.  Eyes:     General: No scleral icterus.    Extraocular Movements: Extraocular movements intact.     Conjunctiva/sclera: Conjunctivae normal.  Cardiovascular:     Rate and Rhythm: Normal rate and regular rhythm.     Heart sounds: No murmur heard. Pulmonary:     Effort: Pulmonary effort is normal. No respiratory distress.     Breath sounds: Normal breath sounds. No wheezing.  Abdominal:     General: Bowel sounds are normal. There is no distension.     Palpations: Abdomen is soft. There is no mass.     Tenderness: There is no abdominal tenderness. There is no guarding or rebound.  Musculoskeletal:        General: Normal range of motion.     Cervical back: Neck supple.  Lymphadenopathy:     Cervical: No cervical adenopathy.  Skin:    General: Skin is warm and dry.     Capillary Refill: Capillary refill takes less than 2 seconds.  Neurological:     Mental Status: She is alert and oriented to person, place, and time.     Deep  Tendon Reflexes: Reflexes normal.  Psychiatric:        Mood and Affect: Mood normal.        Behavior: Behavior normal.    Results:  PHQ-9:  [place] Office Visit from 03/02/2019 in [facility]  PHQ-9 Total Score 0         Assessment: 55 y.o. No obstetric history on file. female here for routine annual physical examination.  Plan: Problem List Items Addressed This Visit   None Visit Diagnoses     Annual physical exam    -  Primary   Relevant Orders   Comprehensive metabolic panel   Lipid panel   CBC   Encounter for screening for HIV       Relevant Orders   HIV Antibody (routine testing w rflx)       Screening: -- Blood pressure screen normal -- cholesterol screening: will obtain -- Weight screening: normal -- Diabetes Screening: will obtain -- Nutrition: Encouraged healthy diet  The 10-year ASCVD risk score (Arnett DK, et al., 2019) is: 1.3%   Values used to calculate the score:     Age: 45 years     Sex: Female     Is Non-Hispanic African American: No     Diabetic: No     Tobacco smoker: No     Systolic Blood Pressure: 817 mmHg     Is BP treated: No     HDL Cholesterol: 82.3 mg/dL     Total Cholesterol: 162 mg/dL  -- Statin therapy for Age 26-75 with CVD risk >7.5%  Psych -- Depression screening (PHQ-9):  [place] Office Visit from 03/02/2019 in [facility]  PHQ-9 Total Score 0        Safety -- tobacco screening: not using -- alcohol screening:  low-risk usage. -- no evidence of domestic violence or intimate partner violence.   Cancer Screening -- pap smear not collected per ASCCP guidelines -- family history of breast cancer screening: done. not at high risk. -- Mammogram -  scheduled -- Colon cancer (age 59+)--  due next year  Immunizations Immunization History  Administered Date(s) Administered   Influenza,inj,Quad PF,6+ Mos 02/27/2018, 02/04/2019   Moderna Sars-Covid-2 Vaccination  07/23/2019, 08/20/2019, 07/18/2020   Tdap 02/26/2017    -- flu vaccine not up to date - will get in 1 month -- TDAP q10 years up to date -- Shingles (age >50) not up to date - will consider in the future -- Covid-19 Vaccine not up to date - considering booster   Encouraged healthy diet and exercise. Encouraged regular vision and dental care.    Lesleigh Noe, MD

## 2021-02-02 NOTE — Patient Instructions (Addendum)
Colonoscopy - Call Dr. Loletha Carrow office if you don't hear anything by March 2023

## 2021-02-03 ENCOUNTER — Encounter: Payer: Self-pay | Admitting: Family Medicine

## 2021-02-03 LAB — COMPREHENSIVE METABOLIC PANEL
ALT: 17 U/L (ref 0–35)
AST: 19 U/L (ref 0–37)
Albumin: 4.6 g/dL (ref 3.5–5.2)
Alkaline Phosphatase: 88 U/L (ref 39–117)
BUN: 17 mg/dL (ref 6–23)
CO2: 29 mEq/L (ref 19–32)
Calcium: 9.6 mg/dL (ref 8.4–10.5)
Chloride: 102 mEq/L (ref 96–112)
Creatinine, Ser: 0.86 mg/dL (ref 0.40–1.20)
GFR: 74.99 mL/min (ref >60.00)
Glucose, Bld: 129 mg/dL — ABNORMAL HIGH (ref 70–99)
Potassium: 4 mEq/L (ref 3.5–5.1)
Sodium: 139 mEq/L (ref 135–145)
Total Bilirubin: 0.4 mg/dL (ref 0.2–1.2)
Total Protein: 6.7 g/dL (ref 6.0–8.3)

## 2021-02-03 LAB — CBC
HCT: 41.9 % (ref 36.0–46.0)
Hemoglobin: 13.8 g/dL (ref 12.0–15.0)
MCHC: 32.8 g/dL (ref 30.0–36.0)
MCV: 92.3 fl (ref 78.0–100.0)
Platelets: 272 10*3/uL (ref 150.0–400.0)
RBC: 4.54 Mil/uL (ref 3.87–5.11)
RDW: 13 % (ref 11.5–15.5)
WBC: 7.1 10*3/uL (ref 4.0–10.5)

## 2021-02-03 LAB — LIPID PANEL
Cholesterol: 165 mg/dL (ref 0–200)
HDL: 72.1 mg/dL (ref >39.00)
LDL Cholesterol: 60 mg/dL (ref 0–99)
NonHDL: 93.25
Total CHOL/HDL Ratio: 2
Triglycerides: 167 mg/dL — ABNORMAL HIGH (ref 0.0–149.0)
VLDL: 33.4 mg/dL (ref 0.0–40.0)

## 2021-02-03 LAB — HIV ANTIBODY (ROUTINE TESTING W REFLEX): HIV 1&2 Ab, 4th Generation: NONREACTIVE

## 2021-02-06 DIAGNOSIS — Z01419 Encounter for gynecological examination (general) (routine) without abnormal findings: Secondary | ICD-10-CM | POA: Diagnosis not present

## 2021-02-06 DIAGNOSIS — Z13820 Encounter for screening for osteoporosis: Secondary | ICD-10-CM | POA: Diagnosis not present

## 2021-02-06 DIAGNOSIS — Z6821 Body mass index (BMI) 21.0-21.9, adult: Secondary | ICD-10-CM | POA: Diagnosis not present

## 2021-02-06 LAB — HM DEXA SCAN

## 2021-02-16 ENCOUNTER — Encounter: Payer: Self-pay | Admitting: Family Medicine

## 2021-02-17 ENCOUNTER — Encounter: Payer: Self-pay | Admitting: Family Medicine

## 2021-02-21 HISTORY — PX: BREAST LUMPECTOMY: SHX2

## 2021-02-22 ENCOUNTER — Other Ambulatory Visit: Payer: Self-pay

## 2021-02-22 ENCOUNTER — Other Ambulatory Visit: Payer: Self-pay | Admitting: Obstetrics and Gynecology

## 2021-02-22 ENCOUNTER — Ambulatory Visit
Admission: RE | Admit: 2021-02-22 | Discharge: 2021-02-22 | Disposition: A | Discharge status: Home / Self Care | Payer: BC Managed Care – PPO | Source: Ambulatory Visit | Attending: Obstetrics and Gynecology | Admitting: Obstetrics and Gynecology

## 2021-02-22 DIAGNOSIS — N644 Mastodynia: Secondary | ICD-10-CM

## 2021-02-22 DIAGNOSIS — Z1231 Encounter for screening mammogram for malignant neoplasm of breast: Secondary | ICD-10-CM

## 2021-03-07 ENCOUNTER — Other Ambulatory Visit: Payer: Self-pay | Admitting: Obstetrics and Gynecology

## 2021-03-07 ENCOUNTER — Ambulatory Visit
Admission: RE | Admit: 2021-03-07 | Discharge: 2021-03-07 | Disposition: A | Discharge status: Home / Self Care | Payer: BC Managed Care – PPO | Source: Ambulatory Visit | Attending: Obstetrics and Gynecology | Admitting: Obstetrics and Gynecology

## 2021-03-07 DIAGNOSIS — N6310 Unspecified lump in the right breast, unspecified quadrant: Secondary | ICD-10-CM

## 2021-03-07 DIAGNOSIS — N644 Mastodynia: Secondary | ICD-10-CM

## 2021-03-07 DIAGNOSIS — R922 Inconclusive mammogram: Secondary | ICD-10-CM | POA: Diagnosis not present

## 2021-03-14 ENCOUNTER — Ambulatory Visit
Admission: RE | Admit: 2021-03-14 | Discharge: 2021-03-14 | Disposition: A | Discharge status: Home / Self Care | Payer: BC Managed Care – PPO | Source: Ambulatory Visit | Attending: Obstetrics and Gynecology | Admitting: Obstetrics and Gynecology

## 2021-03-14 ENCOUNTER — Other Ambulatory Visit: Payer: Self-pay | Admitting: Radiology

## 2021-03-14 ENCOUNTER — Other Ambulatory Visit: Payer: Self-pay

## 2021-03-14 DIAGNOSIS — C50811 Malignant neoplasm of overlapping sites of right female breast: Secondary | ICD-10-CM | POA: Diagnosis not present

## 2021-03-14 DIAGNOSIS — N6315 Unspecified lump in the right breast, overlapping quadrants: Secondary | ICD-10-CM | POA: Diagnosis not present

## 2021-03-14 DIAGNOSIS — N6310 Unspecified lump in the right breast, unspecified quadrant: Secondary | ICD-10-CM

## 2021-03-14 HISTORY — PX: BREAST BIOPSY: SHX20

## 2021-03-15 ENCOUNTER — Telehealth: Payer: Self-pay | Admitting: Hematology and Oncology

## 2021-03-15 NOTE — Telephone Encounter (Signed)
Spoke to patient to confirm afternoon appointment for 11/30, packet will be emailed

## 2021-03-17 ENCOUNTER — Encounter: Payer: Self-pay | Admitting: *Deleted

## 2021-03-20 ENCOUNTER — Other Ambulatory Visit: Payer: Self-pay | Admitting: *Deleted

## 2021-03-20 DIAGNOSIS — C50411 Malignant neoplasm of upper-outer quadrant of right female breast: Secondary | ICD-10-CM | POA: Insufficient documentation

## 2021-03-21 ENCOUNTER — Other Ambulatory Visit: Payer: Self-pay | Admitting: *Deleted

## 2021-03-21 NOTE — Progress Notes (Signed)
[place] NOTE  Patient Care Team: Lesleigh Noe, MD as PCP - General (Family Medicine) Druscilla Brownie, MD as Consulting Physician (Dermatology) Rockwell Germany, RN as Oncology Nurse Navigator Tressie Gentz, Paulette Blanch, RN as Oncology Nurse Navigator Stark Klein, MD as Consulting Physician (General Surgery) [name], MD as Consulting Physician (Hematology and Oncology) Gery Pray, MD as Consulting Physician (Radiation Oncology)  CHIEF COMPLAINTS/PURPOSE OF CONSULTATION:  Newly diagnosed breast cancer  HISTORY OF PRESENTING ILLNESS:  Susan Reid 55 y.o. female is here because of recent diagnosis of invasive ductal carcinoma of the right breast. She presented with a tender palpable lump in the lateral aspect of the right breast. Diagnostic mammogram on 03/07/2021 showed a suspicious 1.6 cm mass in the right breast at 9 o'clock. Biopsy on 03/14/2021 showed invasive ductal carcinoma, Her2+, ER+(60%)/PR+(0%). She presents to the clinic today for initial evaluation and discussion of treatment options.   I reviewed her records extensively and collaborated the history with the patient.  SUMMARY OF ONCOLOGIC HISTORY: Oncology History  Malignant neoplasm of upper-outer quadrant of right breast in female, estrogen receptor positive ([place])  03/14/2021 Initial Diagnosis   Palpable right breast mass diagnostic mammogram: 1.6 cm mass in the right breast at 9 o'clock. 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    03/22/2021 Cancer Staging   Staging form: Breast, AJCC 8th Edition - Clinical stage from 03/22/2021: Stage IA (cT1b, cN0, cM0, G3, ER+, PR-, HER2+) - Signed by [name], MD on 03/22/2021 Stage prefix: Initial diagnosis Histologic grading system: 3 grade system      MEDICAL HISTORY:  Past Medical History:  Diagnosis Date   Allergy    Osteopenia     SURGICAL HISTORY: Past Surgical  History:  Procedure Laterality Date   BREAST BIOPSY Right 02/25/2020   [place]   BREAST BIOPSY Right 03/14/2021   WISDOM TOOTH EXTRACTION  04/24/1987    SOCIAL HISTORY: Social History   Socioeconomic History   Marital status: Married    Spouse name: Don   Number of children: 2   Years of education: High school   Highest education level: Not on file  Occupational History   Not on file  Tobacco Use   Smoking status: Never   Smokeless tobacco: Never  Substance and Sexual Activity   Alcohol use: Yes    Comment: once a month or less   Drug use: No   Sexual activity: Yes    Birth control/protection: Post-menopausal  Other Topics Concern   Not on file  Social History Narrative   10/28/19   From: the area   Living: with Husband, Don   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      Family: 2 children - Psychologist, educational (nurse at [employer]) and [name] (family medicine resident in [place]) - both married 2020 and 2021      Enjoys: work outside in the yard, hiking      Exercise: walking daily, was going to the gym   Diet: diabetic diet - avoiding added sugar      Safety   Seat belts: Yes    Guns: Yes  and secure   Safe in relationships: Yes    Social Determinants of Radio broadcast assistant Strain: Not on file  Food Insecurity: Not on file  Transportation Needs: Not on file  Physical Activity: Not on file  Stress: Not on file  Social Connections: Not on file  Intimate Partner Violence: Not on file    FAMILY HISTORY: Family History  Problem Relation Age of Onset   Arthritis Mother    Hyperlipidemia Mother    Heart disease Mother    Stroke Mother 71   Hypertension Mother    Kidney disease Mother    Breast cancer Mother    Cirrhosis Mother    Mental illness Father    Depression Father    Cancer Maternal Grandfather        Oral  - smoker   Lung cancer Maternal Grandfather    Cancer Paternal Grandmother        unknown type   Lung cancer Maternal Aunt     Lung cancer Maternal Uncle    Colon cancer Neg Hx    Colon polyps Neg Hx    Esophageal cancer Neg Hx    Rectal cancer Neg Hx    Stomach cancer Neg Hx     ALLERGIES:  is allergic to nitrates, organic.  MEDICATIONS:  Current Outpatient Medications  Medication Sig Dispense Refill   calcium citrate (CALCITRATE - DOSED IN MG ELEMENTAL CALCIUM) 950 (200 Ca) MG tablet Take 200 mg of elemental calcium by mouth daily.     fluticasone (FLONASE) 50 MCG/ACT nasal spray Place 1 spray into both nostrils daily as needed for allergies or rhinitis.     loratadine (CLARITIN) 10 MG tablet Take 10 mg by mouth daily as needed for allergies.     Ascorbic Acid (VITAMIN C PO) Take 500 mg by mouth daily.     cholecalciferol (VITAMIN D) 1000 UNITS tablet Take 1,000 Units by mouth daily.     Multiple Vitamin (MULTIVITAMIN) tablet Take 1 tablet by mouth daily.     No current facility-administered medications for this visit.    REVIEW OF SYSTEMS:   Constitutional: Denies fevers, chills or abnormal night sweats Eyes: Denies blurriness of vision, double vision or watery eyes Ears, nose, mouth, throat, and face: Denies mucositis or sore throat Respiratory: Denies cough, dyspnea or wheezes Cardiovascular: Denies palpitation, chest discomfort or lower extremity swelling Gastrointestinal:  Denies nausea, heartburn or change in bowel habits Skin: Denies abnormal skin rashes Lymphatics: Denies new lymphadenopathy or easy bruising Neurological:Denies numbness, tingling or new weaknesses Behavioral/Psych: Mood is stable, no new changes  Breast:  Denies any palpable lumps or discharge All other systems were reviewed with the patient and are negative.  PHYSICAL EXAMINATION: ECOG PERFORMANCE STATUS: 0 - Asymptomatic  Vitals:   03/22/21 1300  BP: 122/76  Pulse: 82  Resp: 18  Temp: 98.8 F (37.1 C)  SpO2: 100%   Filed Weights   03/22/21 1300  Weight: 121 lb 6.4 oz (55.1 kg)      LABORATORY DATA:  I  have reviewed the data as listed Lab Results  Component Value Date   WBC 9.1 03/22/2021   HGB 14.0 03/22/2021   HCT 42.3 03/22/2021   MCV 92.2 03/22/2021   PLT 294 03/22/2021   Lab Results  Component Value Date   NA 139 03/22/2021   K 4.3 03/22/2021   CL 104 03/22/2021   CO2 28 03/22/2021    RADIOGRAPHIC STUDIES: I have personally reviewed the radiological reports and agreed with the findings in the report.  ASSESSMENT AND PLAN:  Malignant neoplasm of upper-outer quadrant of right breast in female, estrogen receptor positive ([place]) 03/14/2021:Palpable right breast mass diagnostic mammogram: 1.6 cm mass in the right breast at 9 o'clock. Biopsy: Grade 2-3 invasive ductal carcinoma, Her2+, Copy #5.85, ratio  3.16 ER+(60%)/PR+(0%), Ki-67 80%.   Pathology and radiology counseling: Discussed with the patient, the details of pathology including the type of breast cancer,the clinical staging, the significance of ER, PR and HER-2/neu receptors and the implications for treatment. After reviewing the pathology in detail, we proceeded to discuss the different treatment options between surgery, radiation, chemotherapy, antiestrogen therapies.  Treatment plan: 1.  Breast conserving surgery with sentinel lymph node biopsy 2. adjuvant chemotherapy with Taxol Herceptin followed by Herceptin maintenance 3.  Adjuvant radiation therapy 4.  Adjuvant antiestrogen therapy  We will obtain a breast MRI and assess if the tumor is abutting the pectoralis muscle.  If that is the case then she might need chemotherapy to be given neoadjuvantly.  Return to clinic after surgery to discuss final pathology result   All questions were answered. The patient knows to call the clinic with any problems, questions or concerns.   Rulon Eisenmenger, MD, MPH 03/22/2021    I, Thana Ates, am acting as scribe for [name], MD.  I have reviewed the above documentation for accuracy and completeness, and I agree with  the above.

## 2021-03-21 NOTE — Progress Notes (Signed)
Radiation Oncology         [phone] ________________________________  Multidisciplinary Breast Oncology Clinic [facility]) Initial Outpatient Consultation  Name: Susan Reid MRN: [id number]  Date: 03/22/2021  DOB: 01/09/1966  WN:UUVO, Jobe Marker, MD  Stark Klein, MD   REFERRING PHYSICIAN: Stark Klein, MD  DIAGNOSIS: The encounter diagnosis was Malignant neoplasm of upper-outer quadrant of right breast in female, estrogen receptor positive ([place]).  Stage IA (cT1b, cN0, cM0) Right Breast UOQ, Invasive Ductal Carcinoma, ER+ / PR- / Her2+, Grade 2-3    ICD-10-CM   1. Malignant neoplasm of upper-outer quadrant of right breast in female, estrogen receptor positive ([place])  C50.411    Z17.0       HISTORY OF PRESENT ILLNESS::Susan Reid is a 55 y.o. female who is presenting to the office today for evaluation of her newly diagnosed breast cancer. She is accompanied by her husband. She is doing well overall.   The patient presented with a tender palpable lump in the lateral aspect of the right breast. Patient also reported pain in this region prior to MRI guided biopsy performed on 02/25/2020 which revealed benign results (likely in a different location).  Subsequently, she underwent bilateral diagnostic mammography with tomography and right breast ultrasonography at [facility] on 03/07/21 showing: a suspicious 1.6 cm mass in the right breast at the 9 o'clock position. No evidence of right axillary lymphadenopathy, or malignancy in the left breast were appreciated.   Right breast biopsy on 03/14/21 showed: grade 2-3 invasive ductal carcinoma measuring 0.9 cm in the greatest linear extent. Prognostic indicators significant for: estrogen receptor, 60% positive with weak staining intensity, and progesterone receptor, 0% negative. Proliferation marker Ki67 at 80%. HER2 positive.  Menarche: 55 years old Age at first live birth: 55 years old GP: 2 LMP: August 2019 Contraceptive: from age  64 - 31 HRT: none   The patient was referred today for presentation in the multidisciplinary conference.  Radiology studies and pathology slides were presented there for review and discussion of treatment options.  A consensus was discussed regarding potential next steps.  PREVIOUS RADIATION THERAPY: No  PAST MEDICAL HISTORY:  Past Medical History:  Diagnosis Date   Allergy    Osteopenia     PAST SURGICAL HISTORY: Past Surgical History:  Procedure Laterality Date   BREAST BIOPSY Right 02/25/2020   [place]   BREAST BIOPSY Right 03/14/2021   WISDOM TOOTH EXTRACTION  04/24/1987    FAMILY HISTORY:  Family History  Problem Relation Age of Onset   Arthritis Mother    Hyperlipidemia Mother    Heart disease Mother    Stroke Mother 88   Hypertension Mother    Kidney disease Mother    Breast cancer Mother    Cirrhosis Mother    Mental illness Father    Depression Father    Cancer Maternal Grandfather        Oral  - smoker   Lung cancer Maternal Grandfather    Cancer Paternal Grandmother        unknown type   Lung cancer Maternal Aunt    Lung cancer Maternal Uncle    Colon cancer Neg Hx    Colon polyps Neg Hx    Esophageal cancer Neg Hx    Rectal cancer Neg Hx    Stomach cancer Neg Hx     SOCIAL HISTORY:  Social History   Socioeconomic History   Marital status: Married    Spouse name: [name]   Number of children: 2  Years of education: High school   Highest education level: Not on file  Occupational History   Not on file  Tobacco Use   Smoking status: Never   Smokeless tobacco: Never  Substance and Sexual Activity   Alcohol use: Yes    Comment: once a month or less   Drug use: No   Sexual activity: Yes    Birth control/protection: Post-menopausal  Other Topics Concern   Not on file  Social History Narrative   10/28/19   From: the area   Living: with Husband, Don   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      Family: 2 children - Psychologist, educational  (nurse at [employer]) and [name] (family medicine resident in [place]) - both married 2020 and 2021      Enjoys: work outside in the yard, hiking      Exercise: walking daily, was going to the gym   Diet: diabetic diet - avoiding added sugar      Safety   Seat belts: Yes    Guns: Yes  and secure   Safe in relationships: Yes    Social Determinants of Radio broadcast assistant Strain: Not on file  Food Insecurity: Not on file  Transportation Needs: Not on file  Physical Activity: Not on file  Stress: Not on file  Social Connections: Not on file    ALLERGIES:  Allergies  Allergen Reactions   Nitrates, Organic Swelling    Throat closes    MEDICATIONS:  Current Outpatient Medications  Medication Sig Dispense Refill   Ascorbic Acid (VITAMIN C PO) Take 500 mg by mouth daily.     calcium citrate (CALCITRATE - DOSED IN MG ELEMENTAL CALCIUM) 950 (200 Ca) MG tablet Take 200 mg of elemental calcium by mouth daily.     cholecalciferol (VITAMIN D) 1000 UNITS tablet Take 1,000 Units by mouth daily.     fluticasone (FLONASE) 50 MCG/ACT nasal spray Place 1 spray into both nostrils daily as needed for allergies or rhinitis.     loratadine (CLARITIN) 10 MG tablet Take 10 mg by mouth daily as needed for allergies.     Multiple Vitamin (MULTIVITAMIN) tablet Take 1 tablet by mouth daily.     No current facility-administered medications for this encounter.    REVIEW OF SYSTEMS: A 10+ POINT REVIEW OF SYSTEMS WAS OBTAINED including neurology, dermatology, psychiatry, cardiac, respiratory, lymph, extremities, GI, GU, musculoskeletal, constitutional, reproductive, HEENT. On the provided form, she reports a palpable breast lump, pain (after biopsy), and anxiety related to her diagnosis. She denies any other symptoms.    PHYSICAL EXAM:    Vitals with BMI 03/22/2021  Height 5' 4.5"  Weight 121 lbs 6 oz  BMI 38.46  Systolic 659  Diastolic 76  Pulse 82   Lungs are clear to auscultation  bilaterally. Heart has regular rate and rhythm. No palpable cervical, supraclavicular, or axillary adenopathy. Abdomen soft, non-tender, normal bowel sounds. Breast: Left breast with no palpable mass, nipple discharge, or bleeding. Right breast with a palpable mass in the 9 o'clock position measuring approximately 1.5 cm in size and with some associated bruising from recent biopsy.   KPS = 90  100 - Normal; no complaints; no evidence of disease. 90   - Able to carry on normal activity; minor signs or symptoms of disease. 80   - Normal activity with effort; some signs or symptoms of disease. 67   - Cares for self; unable to carry on normal  activity or to do active work. 60   - Requires occasional assistance, but is able to care for most of his personal needs. 50   - Requires considerable assistance and frequent medical care. 71   - Disabled; requires special care and assistance. 93   - Severely disabled; hospital admission is indicated although death not imminent. 37   - Very sick; hospital admission necessary; active supportive treatment necessary. 10   - Moribund; fatal processes progressing rapidly. 0     - Dead  Karnofsky DA, Abelmann Jennings, Craver LS and Burchenal [facility] [phone]) The use of the nitrogen mustards in the palliative treatment of carcinoma: with particular reference to bronchogenic carcinoma Cancer 1 634-56  LABORATORY DATA:  Lab Results  Component Value Date   WBC 9.1 03/22/2021   HGB 14.0 03/22/2021   HCT 42.3 03/22/2021   MCV 92.2 03/22/2021   PLT 294 03/22/2021   Lab Results  Component Value Date   NA 139 03/22/2021   K 4.3 03/22/2021   CL 104 03/22/2021   CO2 28 03/22/2021   Lab Results  Component Value Date   ALT 15 03/22/2021   AST 18 03/22/2021   ALKPHOS 89 03/22/2021   BILITOT 0.5 03/22/2021    PULMONARY FUNCTION TEST:   Recent Review Flowsheet Data   There is no flowsheet data to display.     RADIOGRAPHY: US BREAST LTD UNI RIGHT INC AXILLA  Result  Date: 03/07/2021 CLINICAL DATA:  55 year old female presenting for evaluation of a tender palpable lump in the lateral aspect of the right breast. The pain that she has been feeling is in the region of a prior MRI guided biopsy which was performed 02/25/2020, with benign results. She had significant bruising in the lateral aspect of the right breast which persisted for a long time following her biopsy, but has since resolved. EXAM: DIGITAL DIAGNOSTIC BILATERAL MAMMOGRAM WITH TOMOSYNTHESIS AND CAD; ULTRASOUND RIGHT BREAST LIMITED TECHNIQUE: Bilateral digital diagnostic mammography and breast tomosynthesis was performed. The images were evaluated with computer-aided detection.; Targeted ultrasound examination of the right breast was performed COMPARISON:  Previous exam(s). ACR Breast Density Category c: The breast tissue is heterogeneously dense, which may obscure small masses. FINDINGS: Spot compression tomosynthesis images through the palpable site of concern in the far lateral aspect of the right breast demonstrates an oval mass with indistinct and angular margins measuring approximately 1.8 cm. No suspicious calcifications, masses or areas of distortion are seen in the left breast. Ultrasound targeted to the palpable site in the right breast at 9 o'clock, 5 cm from the nipple demonstrates an oval hypoechoic mass with indistinct margins measuring 1.6 x 1.5 x 1.2 cm. Blood flow is seen within the mass on color Doppler imaging. Ultrasound of the right axilla demonstrates multiple normal-appearing lymph nodes. IMPRESSION: 1. There is a suspicious 1.6 cm mass in the right breast at 9 o'clock. 2.  No evidence of right axillary lymphadenopathy. 3.  No evidence of malignancy in the left breast. RECOMMENDATION: 1. Ultrasound-guided biopsy is recommended for the right breast mass at 9 o'clock. The procedure has been scheduled for 03/14/2021 at 11:30 a.m. 2. The patient is overdue for her follow-up MRI. A six-month  follow-up MRI was recommended following her benign MRI guided biopsy 02/25/2020. I have discussed the findings and recommendations with the patient. If applicable, a reminder letter will be sent to the patient regarding the next appointment. BI-RADS CATEGORY  5: Highly suggestive of malignancy. Electronically Signed   By: Sharyn Lull  Theda Sers M.D.   On: 03/07/2021 15:53  MM DIAG BREAST TOMO BILATERAL  Result Date: 03/07/2021 CLINICAL DATA:  55 year old female presenting for evaluation of a tender palpable lump in the lateral aspect of the right breast. The pain that she has been feeling is in the region of a prior MRI guided biopsy which was performed 02/25/2020, with benign results. She had significant bruising in the lateral aspect of the right breast which persisted for a long time following her biopsy, but has since resolved. EXAM: DIGITAL DIAGNOSTIC BILATERAL MAMMOGRAM WITH TOMOSYNTHESIS AND CAD; ULTRASOUND RIGHT BREAST LIMITED TECHNIQUE: Bilateral digital diagnostic mammography and breast tomosynthesis was performed. The images were evaluated with computer-aided detection.; Targeted ultrasound examination of the right breast was performed COMPARISON:  Previous exam(s). ACR Breast Density Category c: The breast tissue is heterogeneously dense, which may obscure small masses. FINDINGS: Spot compression tomosynthesis images through the palpable site of concern in the far lateral aspect of the right breast demonstrates an oval mass with indistinct and angular margins measuring approximately 1.8 cm. No suspicious calcifications, masses or areas of distortion are seen in the left breast. Ultrasound targeted to the palpable site in the right breast at 9 o'clock, 5 cm from the nipple demonstrates an oval hypoechoic mass with indistinct margins measuring 1.6 x 1.5 x 1.2 cm. Blood flow is seen within the mass on color Doppler imaging. Ultrasound of the right axilla demonstrates multiple normal-appearing lymph nodes.  IMPRESSION: 1. There is a suspicious 1.6 cm mass in the right breast at 9 o'clock. 2.  No evidence of right axillary lymphadenopathy. 3.  No evidence of malignancy in the left breast. RECOMMENDATION: 1. Ultrasound-guided biopsy is recommended for the right breast mass at 9 o'clock. The procedure has been scheduled for 03/14/2021 at 11:30 a.m. 2. The patient is overdue for her follow-up MRI. A six-month follow-up MRI was recommended following her benign MRI guided biopsy 02/25/2020. I have discussed the findings and recommendations with the patient. If applicable, a reminder letter will be sent to the patient regarding the next appointment. BI-RADS CATEGORY  5: Highly suggestive of malignancy. Electronically Signed   By: Ammie Ferrier M.D.   On: 03/07/2021 15:53  MM CLIP PLACEMENT RIGHT  Result Date: 03/14/2021 CLINICAL DATA:  Evaluate biopsy marker EXAM: 3D DIAGNOSTIC RIGHT MAMMOGRAM POST ULTRASOUND BIOPSY COMPARISON:  Previous exam(s). FINDINGS: 3D Mammographic images were obtained following ultrasound guided biopsy of a 9 o'clock right breast mass. The biopsy marking clip is in expected position at the site of biopsy. IMPRESSION: Appropriate positioning of the ribbon shaped biopsy marking clip at the site of biopsy in the biopsied 9 o'clock right breast mass. Final Assessment: Post Procedure Mammograms for Marker Placement Electronically Signed   By: Dorise Bullion III M.D.   On: 03/14/2021 12:54  [place] RT BREAST BX W LOC DEV 1ST LESION IMG BX SPEC US GUIDE  Addendum Date: 03/18/2021   ADDENDUM REPORT: 03/18/2021 08:23 ADDENDUM: Pathology revealed GRADE 2/3 INVASIVE DUCTAL CARCINOMA of the RIGHT breast, 9 o'clock (ribbon clip). This was found to be concordant by Dr. Dorise Bullion. Pathology results were discussed with the patient by telephone. The patient reported doing well after the biopsy with tenderness at the site. Post biopsy instructions and care were reviewed and questions were answered. The  patient was encouraged to call [facility] for any additional concerns. The patient was referred to [facility] on March 22, 2021.  Recommend MRI to exclude chest wall invasion. Pathology results reported by Stacie Acres RN on 03/15/2021. Electronically Signed   By: Dorise Bullion III M.D.   On: 03/18/2021 08:23   Result Date: 03/18/2021 CLINICAL DATA:  Ultrasound-guided biopsy a 9 o'clock right breast mass. EXAM: ULTRASOUND GUIDED RIGHT BREAST CORE NEEDLE BIOPSY COMPARISON:  Previous exam(s). PROCEDURE: I met with the patient and we discussed the procedure of ultrasound-guided biopsy, including benefits and alternatives. We discussed the high likelihood of a successful procedure. We discussed the risks of the procedure, including infection, bleeding, tissue injury, clip migration, and inadequate sampling. Informed written consent was given. The usual time-out protocol was performed immediately prior to the procedure. Lesion quadrant: 9 o'clock right breast Using sterile technique and 1% Lidocaine as local anesthetic, under direct ultrasound visualization, a 12 gauge spring-loaded device was used to perform biopsy of a 9 o'clock right breast mass using a lateral approach. At the conclusion of the procedure a ribbon shaped tissue marker clip was deployed into the biopsy cavity. Follow up 2 view mammogram was performed and dictated separately. IMPRESSION: Ultrasound guided biopsy of a 9 o'clock right breast mass. No apparent complications. Electronically Signed: By: Dorise Bullion III M.D. On: 03/14/2021 12:58     IMPRESSION: Stage IA (cT1b, cN0, cM0) Right Breast UOQ, Invasive Ductal Carcinoma, ER+ / PR- / Her2+, Grade 2-3   Patient will be a good candidate for breast conservation with radiotherapy to the right breast. We discussed the general course of radiation, potential side effects, and toxicities  with radiation and the patient is interested in this approach.    PLAN:  MRI Right lumpectomy and SNL/port (depending on chemo approach) (Neoadjuvant or adjuvant) chemotherapy  Adjuvant radiation therapy Aromatase Inhibitor   ------------------------------------------------  Blair Promise, PhD, MD  This document serves as a record of services personally performed by Gery Pray, MD. It was created on his behalf by Roney Mans, a trained medical scribe. The creation of this record is based on the scribe's personal observations and the provider's statements to them. This document has been checked and approved by the attending provider.

## 2021-03-22 ENCOUNTER — Inpatient Hospital Stay: Payer: BC Managed Care – PPO

## 2021-03-22 ENCOUNTER — Ambulatory Visit
Admission: RE | Admit: 2021-03-22 | Discharge: 2021-03-22 | Disposition: A | Discharge status: Home / Self Care | Payer: BC Managed Care – PPO | Source: Ambulatory Visit | Attending: Radiation Oncology | Admitting: Radiation Oncology

## 2021-03-22 ENCOUNTER — Ambulatory Visit (HOSPITAL_BASED_OUTPATIENT_CLINIC_OR_DEPARTMENT_OTHER): Payer: BC Managed Care – PPO | Admitting: Genetic Counselor

## 2021-03-22 ENCOUNTER — Encounter: Payer: Self-pay | Admitting: *Deleted

## 2021-03-22 ENCOUNTER — Other Ambulatory Visit: Payer: Self-pay | Admitting: General Surgery

## 2021-03-22 ENCOUNTER — Other Ambulatory Visit: Payer: Self-pay

## 2021-03-22 ENCOUNTER — Ambulatory Visit: Payer: BC Managed Care – PPO | Attending: General Surgery | Admitting: Physical Therapy

## 2021-03-22 ENCOUNTER — Inpatient Hospital Stay (HOSPITAL_BASED_OUTPATIENT_CLINIC_OR_DEPARTMENT_OTHER): Payer: BC Managed Care – PPO | Admitting: Hematology and Oncology

## 2021-03-22 DIAGNOSIS — C50411 Malignant neoplasm of upper-outer quadrant of right female breast: Secondary | ICD-10-CM

## 2021-03-22 DIAGNOSIS — M8580 Other specified disorders of bone density and structure, unspecified site: Secondary | ICD-10-CM

## 2021-03-22 DIAGNOSIS — Z170 Estrogen receptor positive status [ER+]: Secondary | ICD-10-CM

## 2021-03-22 DIAGNOSIS — R293 Abnormal posture: Secondary | ICD-10-CM | POA: Diagnosis not present

## 2021-03-22 DIAGNOSIS — Z803 Family history of malignant neoplasm of breast: Secondary | ICD-10-CM | POA: Diagnosis not present

## 2021-03-22 DIAGNOSIS — C50811 Malignant neoplasm of overlapping sites of right female breast: Secondary | ICD-10-CM

## 2021-03-22 LAB — CBC WITH DIFFERENTIAL (CANCER CENTER ONLY)
Abs Immature Granulocytes: 0.02 10*3/uL (ref 0.00–0.07)
Basophils Absolute: 0 10*3/uL (ref 0.0–0.1)
Basophils Relative: 0 %
Eosinophils Absolute: 0.1 10*3/uL (ref 0.0–0.5)
Eosinophils Relative: 1 %
HCT: 42.3 % (ref 36.0–46.0)
Hemoglobin: 14 g/dL (ref 12.0–15.0)
Immature Granulocytes: 0 %
Lymphocytes Relative: 11 %
Lymphs Abs: 1 10*3/uL (ref 0.7–4.0)
MCH: 30.5 pg (ref 26.0–34.0)
MCHC: 33.1 g/dL (ref 30.0–36.0)
MCV: 92.2 fL (ref 80.0–100.0)
Monocytes Absolute: 0.6 10*3/uL (ref 0.1–1.0)
Monocytes Relative: 7 %
Neutro Abs: 7.3 10*3/uL (ref 1.7–7.7)
Neutrophils Relative %: 81 %
Platelet Count: 294 10*3/uL (ref 150–400)
RBC: 4.59 MIL/uL (ref 3.87–5.11)
RDW: 12.2 % (ref 11.5–15.5)
WBC Count: 9.1 10*3/uL (ref 4.0–10.5)
nRBC: 0 % (ref 0.0–0.2)

## 2021-03-22 LAB — CMP (CANCER CENTER ONLY)
ALT: 15 U/L (ref 0–44)
AST: 18 U/L (ref 15–41)
Albumin: 4.8 g/dL (ref 3.5–5.0)
Alkaline Phosphatase: 89 U/L (ref 38–126)
Anion gap: 7 (ref 5–15)
BUN: 17 mg/dL (ref 6–20)
CO2: 28 mmol/L (ref 22–32)
Calcium: 9.6 mg/dL (ref 8.9–10.3)
Chloride: 104 mmol/L (ref 98–111)
Creatinine: 0.84 mg/dL (ref 0.44–1.00)
GFR, Estimated: >60 mL/min (ref >60)
Glucose, Bld: 101 mg/dL — ABNORMAL HIGH (ref 70–99)
Potassium: 4.3 mmol/L (ref 3.5–5.1)
Sodium: 139 mmol/L (ref 135–145)
Total Bilirubin: 0.5 mg/dL (ref 0.3–1.2)
Total Protein: 7.8 g/dL (ref 6.5–8.1)

## 2021-03-22 LAB — GENETIC SCREENING ORDER

## 2021-03-22 NOTE — Assessment & Plan Note (Signed)
03/14/2021:Palpable right breast mass diagnostic mammogram: 1.6 cm mass in the right breast at 9 o'clock. 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   Pathology and radiology counseling: Discussed with the patient, the details of pathology including the type of breast cancer,the clinical staging, the significance of ER, PR and HER-2/neu receptors and the implications for treatment. After reviewing the pathology in detail, we proceeded to discuss the different treatment options between surgery, radiation, chemotherapy, antiestrogen therapies.  Treatment plan: 1.  Breast conserving surgery with sentinel lymph node biopsy 2. adjuvant chemotherapy with Taxol Herceptin followed by Herceptin maintenance 3.  Adjuvant radiation therapy 4.  Adjuvant antiestrogen therapy  Return to clinic after surgery to discuss final pathology result

## 2021-03-22 NOTE — Patient Instructions (Signed)

## 2021-03-22 NOTE — Therapy (Signed)
[facility] @ [place], [place], [address] Phone: [phone]   Fax:  [phone]  Physical Therapy Evaluation  Patient Details  Name: Susan Reid MRN: [id number] Date of Birth: 05-Jun-1965 Referring Provider (PT): Dr. Stark Klein   Encounter Date: 03/22/2021   PT End of Session - 03/22/21 1405     Visit Number 1    Number of Visits 2    Date for PT Re-Evaluation 05/17/21    PT Start Time 8563    PT Stop Time 1330   Also saw pt from 1497 to 1515 for a total of 33 minutes   PT Time Calculation (min) 5 min    Activity Tolerance Patient tolerated treatment well    Behavior During Therapy [facility] for tasks assessed/performed             Past Medical History:  Diagnosis Date   Allergy    Osteopenia     Past Surgical History:  Procedure Laterality Date   BREAST BIOPSY Right 02/25/2020   [place]   BREAST BIOPSY Right 03/14/2021   WISDOM TOOTH EXTRACTION  04/24/1987    There were no vitals filed for this visit.    Subjective Assessment - 03/22/21 1401     Subjective Patient reports she is here today to be seen by her medical team for her newly diagnosed right breast cancer.    Patient is accompained by: Family member    Pertinent History Patient was diagnosed on 03/07/2021 with 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 It measures 1.6 cm and is located in the upper outer quadrant. It is ER positive, PR negative, and HER2 negative with a Ki67 of 80%.    Patient Stated Goals Reduce lympehdema risk and learn post op HEP    Currently in Pain? No/denies                [facility] PT Assessment - 03/22/21 0001       Assessment   Medical Diagnosis Right breast cancer    Referring Provider (PT) Dr. Stark Klein    Onset Date/Surgical Date 03/07/21    Hand Dominance Right    Prior Therapy none      Precautions   Precautions Other (comment)    Precaution Comments active cancer       Restrictions   Weight Bearing Restrictions No      Balance Screen   Has the patient fallen in the past 6 months No    Has the patient had a decrease in activity level because of a fear of falling?  No    Is the patient reluctant to leave their home because of a fear of falling?  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    Leisure She walks 1 mile daily (17-18 min)      Cognition   Overall Cognitive Status Within Functional Limits for tasks assessed      Posture/Postural Control   Posture/Postural Control Postural limitations    Postural Limitations Rounded Shoulders;Forward head      ROM / Strength   AROM / PROM / Strength AROM;Strength      AROM  AROM Assessment Site Shoulder    Right/Left Shoulder Right;Left    Right Shoulder Extension 58 Degrees    Right Shoulder Flexion 141 Degrees    Right Shoulder ABduction 144 Degrees    Right Shoulder Internal Rotation 62 Degrees    Right Shoulder External Rotation 86 Degrees    Left Shoulder Extension 57 Degrees    Left Shoulder Flexion 141 Degrees    Left Shoulder ABduction 158 Degrees    Left Shoulder Internal Rotation 81 Degrees    Left Shoulder External Rotation 83 Degrees      Strength   Overall Strength Within functional limits for tasks performed               LYMPHEDEMA/ONCOLOGY QUESTIONNAIRE - 03/22/21 0001       Type   Cancer Type Right breast cancer      Lymphedema Assessments   Lymphedema Assessments Upper extremities      Right Upper Extremity Lymphedema   10 cm Proximal to Olecranon Process 25.3 cm    Olecranon Process 22.6 cm    10 cm Proximal to Ulnar Styloid Process 19.4 cm    Just Proximal to Ulnar Styloid Process 13.2 cm     Across Hand at [employer] 17.8 cm    At [place] of 2nd Digit 5.7 cm      Left Upper Extremity Lymphedema   10 cm Proximal to Olecranon Process 24 cm    Olecranon Process 22 cm    10 cm Proximal to Ulnar Styloid Process 19.2 cm    Just Proximal to Ulnar Styloid Process 13.2 cm    Across Hand at [employer] 17.6 cm    At [place] of 2nd Digit 5.4 cm             L-DEX FLOWSHEETS - 03/22/21 1400       L-DEX LYMPHEDEMA SCREENING   Measurement Type Unilateral    L-DEX MEASUREMENT EXTREMITY Upper Extremity    POSITION  Standing    DOMINANT SIDE Right    At Risk Side Right    BASELINE SCORE (UNILATERAL) 2.8             The patient was assessed using the L-Dex machine today to produce a lymphedema index baseline score. The patient will be reassessed on a regular basis (typically every 3 months) to obtain new L-Dex scores. If the score is > 6.5 points away from his/her baseline score indicating onset of subclinical lymphedema, it will be recommended to wear a compression garment for 4 weeks, 12 hours per day and then be reassessed. If the score continues to be > 6.5 points from baseline at reassessment, we will initiate lymphedema treatment. Assessing in this manner has a 95% rate of preventing clinically significant lymphedema.      Katina Dung - 03/22/21 0001     Open a tight or new jar No difficulty    Do heavy household chores (wash walls, wash floors) No difficulty    Carry a shopping bag or briefcase No difficulty    Wash your back No difficulty    Use a knife to cut food No difficulty    Recreational activities in which you take some force or impact through your arm, shoulder, or hand (golf, hammering, tennis) No difficulty    During the past week, to what extent has your arm, shoulder or hand problem interfered with your normal social activities with family, friends, neighbors, or groups? Not at all  During the past week, to what extent has your arm, shoulder or hand  problem limited your work or other regular daily activities Not at all    Arm, shoulder, or hand pain. None    Tingling (pins and needles) in your arm, shoulder, or hand None    Difficulty Sleeping No difficulty    DASH Score 0 %              Objective measurements completed on examination: See above findings.        Patient was instructed today in a home exercise program today for post op shoulder range of motion. These included active assist shoulder flexion in sitting, scapular retraction, wall walking with shoulder abduction, and hands behind head external rotation.  She was encouraged to do these twice a day, holding 3 seconds and repeating 5 times when permitted by her physician.          PT Education - 03/22/21 1404     Education Details Post op HEP and lymphedema education    Person(s) Educated Patient;Spouse    Methods Explanation;Demonstration;Handout    Comprehension Returned demonstration;Verbalized understanding                 PT Long Term Goals - 03/22/21 1429       PT LONG TERM GOAL #1   Title Patient will demonstrate she has regained full shoulder ROM and function post operatively compared to baselines.    Time 8    Period Weeks    Status New    Target Date 05/17/21             Breast Clinic Goals - 03/22/21 1429       Patient will be able to verbalize understanding of pertinent lymphedema risk reduction practices relevant to her diagnosis specifically related to skin care.   Time 1    Period Days    Status Achieved      Patient will be able to return demonstrate and/or verbalize understanding of the post-op home exercise program related to regaining shoulder range of motion.   Time 1    Period Days    Status Achieved      Patient will be able to verbalize understanding of the importance of attending the postoperative After Breast Cancer Class for further lymphedema risk reduction education and therapeutic exercise.   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 It measures 1.6 cm and is located in the upper outer quadrant. It is ER positive, PR negative, and HER2 negative with a Ki67 of 80%. Her multidisciplinary medical team met prior to her assessments to determine a recommended treatment plan. She is planning to have an MRI and depending on those results may undergo neoadjuvant chemotherapy. If not, she will undergo a right lumpectomy and sentinel node biopsy followed by chemotherapy, radiation, and anti-estrogen therapy. She will benefit from a post op PT reassessment and from L-Dex screens every 3 months to detect subclinical lymphedema.    Stability/Clinical Decision Making Stable/Uncomplicated    Clinical Decision Making Low    Rehab Potential Excellent    PT Frequency --   Eval and 1 f/u visit   PT Treatment/Interventions ADLs/Self Care Home Management;Therapeutic exercise;Patient/family education  PT Next Visit Plan Will reassess 3-4 weeks post op    PT Home Exercise Plan Post op HEP    Consulted and Agree with Plan of Care Patient;Family member/caregiver    Family Member Consulted Husband             Patient will benefit from skilled therapeutic intervention in order to improve the following deficits and impairments:  Postural dysfunction, Decreased range of motion, Decreased knowledge of precautions, Impaired UE functional use, Pain  Visit Diagnosis: Malignant neoplasm of upper-outer quadrant of right breast in female, estrogen receptor positive ([place]) - Plan: PT plan of care cert/re-cert  Abnormal posture - Plan: PT plan of care cert/re-cert  Patient will follow up at outpatient cancer rehab 3-4 weeks following surgery.  If the patient requires physical therapy at that time, a specific plan will be dictated and sent  to the referring physician for approval. The patient was educated today on appropriate basic range of motion exercises to begin post operatively and the importance of attending the After Breast Cancer class following surgery.  Patient was educated today on lymphedema risk reduction practices as it pertains to recommendations that will benefit the patient immediately following surgery.  She verbalized good understanding.      Problem List Patient Active Problem List   Diagnosis Date Noted   Malignant neoplasm of upper-outer quadrant of right breast in female, estrogen receptor positive ([place]) 03/20/2021   Osteoporosis 12/10/2018   Annia Friendly, PT 03/22/21 3:25 PM   [facility] @ [place], [place], [address] Phone: [phone]   Fax:  [phone]  Name: Susan Reid MRN: [id number] Date of Birth: 1966-04-05

## 2021-03-23 ENCOUNTER — Encounter: Payer: Self-pay | Admitting: Genetic Counselor

## 2021-03-23 DIAGNOSIS — Z803 Family history of malignant neoplasm of breast: Secondary | ICD-10-CM | POA: Insufficient documentation

## 2021-03-23 NOTE — Progress Notes (Addendum)
REFERRING PROVIDER: [name], MD [address] [place], [place] [address]  PRIMARY PROVIDER:  Lesleigh Noe, MD  PRIMARY REASON FOR VISIT:  Encounter Diagnoses  Name Primary?   Malignant neoplasm of upper-outer quadrant of right breast in female, estrogen receptor positive ([place]) Yes   Family history of breast cancer     HISTORY OF PRESENT ILLNESS:   Ms. Brand, a 55 y.o. female, was seen for a [place] cancer genetics consultation during the breast multidisciplinary clinic at the request of Dr. Lindi Adie due to a personal and family history of cancer.  Ms. Gossett presents to clinic today to discuss the possibility of a hereditary predisposition to cancer, to discuss genetic testing, and to further clarify her future cancer risks, as well as potential cancer risks for family members.   In November 2022, at the age of 48, Ms. Hanlon was diagnosed with invasive ductal carcinoma of the right breast.  CANCER HISTORY:  Oncology History  Malignant neoplasm of upper-outer quadrant of right breast in female, estrogen receptor positive ([place])  03/14/2021 Initial Diagnosis   Palpable right breast mass diagnostic mammogram: 1.6 cm mass in the right breast at 9 o'clock. 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    03/22/2021 Cancer Staging   Staging form: Breast, AJCC 8th Edition - Clinical stage from 03/22/2021: Stage IA (cT1b, cN0, cM0, G3, ER+, PR-, HER2+) - Signed by [name], MD on 03/22/2021 Stage prefix: Initial diagnosis Histologic grading system: 3 grade system      Past Medical History:  Diagnosis Date   Allergy    Osteopenia     Past Surgical History:  Procedure Laterality Date   BREAST BIOPSY Right 02/25/2020   [place]   BREAST BIOPSY Right 03/14/2021   WISDOM TOOTH EXTRACTION  04/24/1987    Social History   Socioeconomic History   Marital status: Married    Spouse name: Don   Number of  children: 2   Years of education: High school   Highest education level: Not on file  Occupational History   Not on file  Tobacco Use   Smoking status: Never   Smokeless tobacco: Never  Substance and Sexual Activity   Alcohol use: Yes    Comment: once a month or less   Drug use: No   Sexual activity: Yes    Birth control/protection: Post-menopausal  Other Topics Concern   Not on file  Social History Narrative   10/28/19   From: the area   Living: with Husband, Don   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      Family: 2 children - Psychologist, educational (nurse at [employer]) and Sport and exercise psychologist (family medicine resident in [place]) - both married 2020 and 2021      Enjoys: work outside in the yard, hiking      Exercise: walking daily, was going to the gym   Diet: diabetic diet - avoiding added sugar      Safety   Seat belts: Yes    Guns: Yes  and secure   Safe in relationships: Yes    Social Determinants of Radio broadcast assistant Strain: Not on file  Food Insecurity: Not on file  Transportation Needs: Not on file  Physical Activity: Not on file  Stress: Not on file  Social Connections: Not on file     FAMILY HISTORY:  We obtained a detailed, 4-generation family history.  Significant diagnoses are listed below:  Family History  Problem Relation Age of Onset   Breast cancer Mother 59   Lung cancer Maternal Aunt 68   Lung cancer Maternal Uncle 25   Cancer Maternal Grandfather        Oral  - smoker   Lung cancer Maternal Grandfather    Cancer Paternal Grandmother        unknown type   Prostate cancer Paternal Grandfather    Breast cancer Maternal Great-grandmother      Ms. Cibrian's mother was diagnosed with breast cancer at age 22 and died at age 15. Her maternal aunt was diagnosed with lung cancer at age 63 and her maternal uncle was diagnosed with lung cancer at age 2, they were both heavy smokers. Her maternal grandfather was diagnosed with lung cancer in his  60s, he smoked, he died at age 37. Ms. Pelzer maternal grandfather's mother was diagnosed with breast cancer at an unknown age, she is deceased. Her paternal grandmother was diagnosed with an unknown cancer at an unknown age and her paternal grandfather was diagnosed with prostate and lung cancer, he died at 45.  Ms. Burges is unaware of previous family history of genetic testing for hereditary cancer risks. There is no reported Ashkenazi Jewish ancestry.   GENETIC COUNSELING ASSESSMENT: Ms. Varricchio is a 55 y.o. female with a personal and family history of cancer which is somewhat suggestive of a hereditary cancer syndrome and predisposition to cancer given multiple generations affected by breast cancer. We, therefore, discussed and recommended the following at today's visit.   DISCUSSION: We discussed that 5 - 10% of cancer is hereditary, with most cases of hereditary breast cancer associated with mutations in BRCA1/2.  There are other genes that can be associated with hereditary breast cancer syndromes. Type of cancer risk and level of risk are gene-specific. We discussed that testing is beneficial for several reasons including knowing how to follow individuals after completing their treatment, identifying whether potential treatment options would be beneficial, and understanding if other family members could be at risk for cancer and allowing them to undergo genetic testing.   We reviewed the characteristics, features and inheritance patterns of hereditary cancer syndromes. We also discussed genetic testing, including the appropriate family members to test, the process of testing, insurance coverage and turn-around-time for results. We discussed the implications of a negative, positive and/or variant of uncertain significant result. In order to get genetic test results in a timely manner so that Ms. Mena can use these genetic test results for surgical decisions, we recommended Ms. Lissa Merlin pursue genetic  testing for the [employer]. Once complete, we recommend Ms. Lissa Merlin pursue reflex genetic testing to a more comprehensive gene panel.   Ms. Alire  was offered a common hereditary cancer panel (47 genes) and an expanded pan-cancer panel (77 genes). Ms. Mendell was informed of the benefits and limitations of each panel, including that expanded pan-cancer panels contain genes that do not have clear management guidelines at this point in time.  We also discussed that as the number of genes included on a panel increases, the chances of variants of uncertain significance increases.  After considering the benefits and limitations of each gene panel, Ms. Rahe elected to have [facility] CustomNext Panel+RNA.   The CustomNext-Cancer+RNAinsight panel offered by [facility] includes sequencing and rearrangement analysis for the following 47 genes:  APC, ATM, AXIN2, BARD1, BMPR1A, BRCA1, BRCA2, BRIP1, CDH1, CDK4, CDKN2A, CHEK2, CTNNA1, DICER1, EPCAM, GREM1, HOXB13, KIT, MEN1, MLH1, MSH2, MSH3, MSH6, MUTYH, NBN,  NF1, NTHL1, PALB2, PDGFRA, PMS2, POLD1, POLE, PTEN, RAD50, RAD51C, RAD51D, SDHA, SDHB, SDHC, SDHD, SMAD4, SMARCA4, STK11, TP53, TSC1, TSC2, and VHL.  RNA data is routinely analyzed for use in variant interpretation for all genes.  Based on Ms. Patalano's personal and family history of cancer, she meets medical criteria for genetic testing. Despite that she meets criteria, she may still have an out of pocket cost. We discussed that if her out of pocket cost for testing is over $100, the laboratory should contact them to discuss self-pay prices, patient pay assistance programs, if applicable, and other billing options.   PLAN: After considering the risks, benefits, and limitations, Ms. Pearman provided informed consent to pursue genetic testing and the blood sample was sent to [facility] for analysis of the CustomNext Panel (47 genes). Results should be available within approximately 1-2 weeks' time, at which  point they will be disclosed by telephone to Ms. Hauth, as will any additional recommendations warranted by these results. Ms. Alumbaugh will receive a summary of her genetic counseling visit and a copy of her results once available. This information will also be available in Epic.   Ms. Lottman questions were answered to her satisfaction today. Our contact information was provided should additional questions or concerns arise. Thank you for the referral and allowing [place] to share in the care of your patient.   Lucille Passy, MS, [facility] Genetic Counselor [email or web address] (P) [phone]  The patient was seen for a total of 20 minutes in face-to-face genetic counseling.  The patient brought her husband.  Drs. Magrinat, Lindi Adie and/or Burr Medico were available to discuss this case as needed.  _______________________________________________________________________ For Office Staff:  Number of people involved in session: 2 Was an Intern/ student involved with case: no

## 2021-03-24 ENCOUNTER — Ambulatory Visit
Admission: RE | Admit: 2021-03-24 | Discharge: 2021-03-24 | Disposition: A | Discharge status: Home / Self Care | Payer: BC Managed Care – PPO | Source: Ambulatory Visit | Attending: Hematology and Oncology | Admitting: Hematology and Oncology

## 2021-03-24 DIAGNOSIS — C50912 Malignant neoplasm of unspecified site of left female breast: Secondary | ICD-10-CM | POA: Diagnosis not present

## 2021-03-24 DIAGNOSIS — C50411 Malignant neoplasm of upper-outer quadrant of right female breast: Secondary | ICD-10-CM

## 2021-03-24 MED ORDER — GADOBUTROL 1 MMOL/ML IV SOLN
5.0000 mL | Freq: Once | INTRAVENOUS | Status: AC | PRN
  Administered 2021-03-24: 5 mL via INTRAVENOUS

## 2021-03-29 ENCOUNTER — Encounter: Payer: Self-pay | Admitting: General Practice

## 2021-03-29 NOTE — Progress Notes (Signed)
[facility] Psychosocial Distress Screening Spiritual Care  Met with Susan Reid by phone following Breast Multidisciplinary Clinic to introduce [facility] team/resources, reviewing distress screen per protocol.  The patient scored a 6 on the Psychosocial Distress Thermometer which indicates moderate distress. Also assessed for distress and other psychosocial needs.   [facility] DISTRESS SCREENING 03/29/2021  Screening Type Initial Screening  Distress experienced in past week (1-10) 6  Emotional problem type Nervousness/Anxiety  Referral to support programs Yes   Susan Reid good support from family/social circles and her [place] in [place]. She notes that time to digest and process diagnosis, clinic, and treatment plan has reduced her distress.   Follow up needed: No. Per Susan Reid, no other needs or concerns at this time, but she plans to look into support programming and is appreciative of resources' availability.   [address], [place], [facility] Pager [phone] Voicemail [phone]

## 2021-03-30 ENCOUNTER — Telehealth: Payer: Self-pay | Admitting: *Deleted

## 2021-03-30 ENCOUNTER — Encounter: Payer: Self-pay | Admitting: *Deleted

## 2021-03-30 NOTE — Telephone Encounter (Signed)
Spoke with patient to follow up from [facility] 11[facility]30 and assess navigation needs.  Patient denies any questions or concerns at this time.  Confirmed follow up appts. Encouraged her to call should anything arise. Patient verbalized understanding.

## 2021-04-03 ENCOUNTER — Telehealth: Payer: Self-pay

## 2021-04-03 ENCOUNTER — Encounter: Payer: Self-pay | Admitting: Hematology and Oncology

## 2021-04-03 ENCOUNTER — Telehealth: Payer: Self-pay | Admitting: Genetic Counselor

## 2021-04-03 ENCOUNTER — Encounter: Payer: Self-pay | Admitting: Genetic Counselor

## 2021-04-03 DIAGNOSIS — Z1379 Encounter for other screening for genetic and chromosomal anomalies: Secondary | ICD-10-CM | POA: Insufficient documentation

## 2021-04-03 NOTE — Telephone Encounter (Signed)
I contacted Ms. Falconi to discuss her genetic testing results. No pathogenic variants were identified in the 8 genes analyzed. Of note, we are still waiting on additional genes and will contact her when those results are available.  The test report has been scanned into EPIC and is located under the Molecular Pathology section of the Results Review tab.  A portion of the result report is included below for reference. Detailed clinic note to follow.  Lucille Passy, MS, [facility] Genetic Counselor [email or web address] (P) [phone]

## 2021-04-03 NOTE — Telephone Encounter (Signed)
Pt sent mychart message regarding HER2 status. Pt understands is is HER2 positive. Pt verbalized thanks and understanding.

## 2021-04-05 ENCOUNTER — Other Ambulatory Visit: Payer: Self-pay

## 2021-04-05 ENCOUNTER — Ambulatory Visit (HOSPITAL_COMMUNITY)
Admission: RE | Admit: 2021-04-05 | Discharge: 2021-04-05 | Disposition: A | Discharge status: Home / Self Care | Payer: BC Managed Care – PPO | Source: Ambulatory Visit | Attending: Hematology and Oncology | Admitting: Hematology and Oncology

## 2021-04-05 DIAGNOSIS — C50411 Malignant neoplasm of upper-outer quadrant of right female breast: Secondary | ICD-10-CM | POA: Insufficient documentation

## 2021-04-05 DIAGNOSIS — Z0189 Encounter for other specified special examinations: Secondary | ICD-10-CM | POA: Diagnosis not present

## 2021-04-05 DIAGNOSIS — Z01810 Encounter for preprocedural cardiovascular examination: Secondary | ICD-10-CM | POA: Diagnosis not present

## 2021-04-05 DIAGNOSIS — Z170 Estrogen receptor positive status [ER+]: Secondary | ICD-10-CM | POA: Insufficient documentation

## 2021-04-05 LAB — ECHOCARDIOGRAM COMPLETE
Area-P 1/2: 2.92 cm2
S' Lateral: 2.9 cm

## 2021-04-07 ENCOUNTER — Telehealth: Payer: Self-pay | Admitting: Genetic Counselor

## 2021-04-07 NOTE — Telephone Encounter (Signed)
I contacted Ms. Rogel to discuss her genetic testing results. No pathogenic variants were identified in the 47 genes analyzed. Detailed clinic note to follow.  The test report has been scanned into EPIC and is located under the Molecular Pathology section of the Results Review tab.  A portion of the result report is included below for reference.   Lucille Passy, MS, [facility] Genetic Counselor [email or web address] (P) [phone]

## 2021-04-10 ENCOUNTER — Ambulatory Visit: Payer: Self-pay | Admitting: Genetic Counselor

## 2021-04-10 DIAGNOSIS — Z1379 Encounter for other screening for genetic and chromosomal anomalies: Secondary | ICD-10-CM

## 2021-04-10 DIAGNOSIS — Z170 Estrogen receptor positive status [ER+]: Secondary | ICD-10-CM

## 2021-04-10 NOTE — Progress Notes (Signed)
HPI:   Susan Reid was previously seen in the [place] clinic due to a personal and family history of cancer and concerns regarding a hereditary predisposition to cancer. Please refer to our prior cancer genetics clinic note for more information regarding our discussion, assessment and recommendations, at the time. Ms. Meinhardt recent genetic test results were disclosed to her, as were recommendations warranted by these results. These results and recommendations are discussed in more detail below.  CANCER HISTORY:  Oncology History  Malignant neoplasm of upper-outer quadrant of right breast in female, estrogen receptor positive ([place])  03/14/2021 Initial Diagnosis   Palpable right breast mass diagnostic mammogram: 1.6 cm mass in the right breast at 9 o'clock. 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    03/22/2021 Cancer Staging   Staging form: Breast, AJCC 8th Edition - Clinical stage from 03/22/2021: Stage IA (cT1b, cN0, cM0, G3, ER+, PR-, HER2+) - Signed by [name], MD on 03/22/2021 Stage prefix: Initial diagnosis Histologic grading system: 3 grade system     Genetic Testing   [facility] CustomNext Panel was Negative. Report date is 04/03/2021.  The CustomNext-Cancer+RNAinsight panel offered by [facility] includes sequencing and rearrangement analysis for the following 47 genes:  APC, ATM, AXIN2, BARD1, BMPR1A, BRCA1, BRCA2, BRIP1, CDH1, CDK4, CDKN2A, CHEK2, CTNNA1, DICER1, EPCAM, GREM1, HOXB13, KIT, MEN1, MLH1, MSH2, MSH3, MSH6, MUTYH, NBN, NF1, NTHL1, PALB2, PDGFRA, PMS2, POLD1, POLE, PTEN, RAD50, RAD51C, RAD51D, SDHA, SDHB, SDHC, SDHD, SMAD4, SMARCA4, STK11, TP53, TSC1, TSC2, and VHL.  RNA data is routinely analyzed for use in variant interpretation for all genes.     FAMILY HISTORY:  We obtained a detailed, 4-generation family history.  Significant diagnoses are listed below:      Family History   Problem Relation Age of Onset   Breast cancer Mother 37   Lung cancer Maternal Aunt 65   Lung cancer Maternal Uncle 19   Cancer Maternal Grandfather          Oral  - smoker   Lung cancer Maternal Grandfather     Cancer Paternal Grandmother          unknown type   Prostate cancer Paternal Grandfather     Breast cancer Maternal Great-grandmother         Ms. Gervacio's mother was diagnosed with breast cancer at age 30 and died at age 74. Her maternal aunt was diagnosed with lung cancer at age 73 and her maternal uncle was diagnosed with lung cancer at age 80, they were both heavy smokers. Her maternal grandfather was diagnosed with lung cancer in his 69s, he smoked, he died at age 58. Ms. Geerdes maternal grandfather's mother was diagnosed with breast cancer at an unknown age, she is deceased. Her paternal grandmother was diagnosed with an unknown cancer at an unknown age and her paternal grandfather was diagnosed with prostate and lung cancer, he died at 65.   Ms. Radke is unaware of previous family history of genetic testing for hereditary cancer risks. There is no reported Ashkenazi Jewish ancestry.   GENETIC TEST RESULTS:  The [facility] CustomNext Panel found no pathogenic mutations.   The CustomNext-Cancer+RNAinsight panel offered by [facility] includes sequencing and rearrangement analysis for the following 47 genes:  APC, ATM, AXIN2, BARD1, BMPR1A, BRCA1, BRCA2, BRIP1, CDH1, CDK4, CDKN2A, CHEK2, CTNNA1, DICER1, EPCAM, GREM1, HOXB13, KIT, MEN1, MLH1, MSH2, MSH3, MSH6, MUTYH, NBN, NF1, NTHL1, PALB2, PDGFRA, PMS2, POLD1, POLE, PTEN, RAD50, RAD51C, RAD51D,  SDHA, SDHB, SDHC, SDHD, SMAD4, SMARCA4, STK11, TP53, TSC1, TSC2, and VHL.  RNA data is routinely analyzed for use in variant interpretation for all genes.  The test report has been scanned into EPIC and is located under the Molecular Pathology section of the Results Review tab.  A portion of the result report is included below for reference.  Genetic testing reported out on 04/03/2021.       Even though a pathogenic variant was not identified, possible explanations for her personal history of cancer may include: There may be no hereditary risk for cancer in the family. The cancers in Ms. Yerby and/or her family may be due to other genetic or environmental factors. There may be a gene mutation in one of these genes that current testing methods cannot detect, but that chance is small. There could be another gene that has not yet been discovered, or that we have not yet tested, that is responsible for the cancer diagnoses in the family.   Therefore, it is important to remain in touch with cancer genetics in the future so that we can continue to offer Ms. Kontz the most up to date genetic testing.   ADDITIONAL GENETIC TESTING:  We discussed with Ms. Messmer that her genetic testing was fairly extensive.  If there are genes identified to increase cancer risk that can be analyzed in the future, we would be happy to discuss and coordinate this testing at that time.    CANCER SCREENING RECOMMENDATIONS:  Ms. Sadowski test result is considered negative (normal).  This means that we have not identified a hereditary cause for her personal and family history of cancer at this time.  An individual's cancer risk and medical management are not determined by genetic test results alone. Overall cancer risk assessment incorporates additional factors, including personal medical history, family history, and any available genetic information that may result in a personalized plan for cancer prevention and surveillance. Therefore, it is recommended she continue to follow the cancer management and screening guidelines provided by her oncology and primary healthcare provider.  RECOMMENDATIONS FOR FAMILY MEMBERS:   Since she did not inherit a mutation in a cancer predisposition gene included on this panel, her children could not have inherited a mutation from  her in one of these genes. Individuals in this family might be at some increased risk of developing cancer, over the general population risk, due to the family history of cancer. We recommend women in this family have a yearly mammogram beginning at age 44, or 38 years younger than the earliest onset of cancer, an annual clinical breast exam, and perform monthly breast self-exams.  FOLLOW-UP:  Cancer genetics is a rapidly advancing field and it is possible that new genetic tests will be appropriate for her and/or her family members in the future. We encouraged her to remain in contact with cancer genetics on an annual basis so we can update her personal and family histories and let her know of advances in cancer genetics that may benefit this family.   Our contact number was provided. Ms. Bollen questions were answered to her satisfaction, and she knows she is welcome to call us at anytime with additional questions or concerns.   Lucille Passy, MS, [facility] Genetic Counselor [email or web address] (P) [phone]

## 2021-04-11 NOTE — Pre-Procedure Instructions (Signed)
Surgical Instructions    Your procedure is scheduled on Thursday 04/10/21.   Report to [facility] Entrance "A" at 12:45 P.M., then check in with the Admitting office.  Call this number if you have problems the morning of surgery:  [phone]   If you have any questions prior to your surgery date call [phone]: Open Monday-Friday 8am-4pm    Remember:  Do not eat after midnight the night before your surgery  You may drink clear liquids until 11:45 A.M. the morning of your surgery.   Clear liquids allowed are: Water, Non-Citrus Juices (without pulp), Carbonated Beverages, Clear Tea, Black Coffee ONLY (NO MILK, CREAM OR POWDERED CREAMER of any kind), and Gatorade    Take these medicines the morning of surgery with A SIP OF WATER    fluticasone (FLONASE)- If needed  loratadine (CLARITIN)- If needed  As of today, STOP taking any Aspirin (unless otherwise instructed by your surgeon) Aleve, Naproxen, Ibuprofen, Motrin, Advil, Goody's, BC's, all herbal medications, fish oil, and all vitamins.     After your COVID test   You are not required to quarantine however you are required to wear a well-fitting mask when you are out and around people not in your household.  If your mask becomes wet or soiled, replace with a new one.  Wash your hands often with soap and water for 20 seconds or clean your hands with an alcohol-based hand sanitizer that contains at least 60% alcohol.  Do not share personal items.  Notify your provider: if you are in close contact with someone who has COVID  or if you develop a fever of 100.4 or greater, sneezing, cough, sore throat, shortness of breath or body aches.             Do not wear jewelry or makeup Do not wear lotions, powders, perfumes/colognes, or deodorant. Do not shave 48 hours prior to surgery.  Men may shave face and neck. Do not bring valuables to the hospital. DO Not wear nail polish, gel polish, artificial nails, or any other  type of covering on natural nails including finger and toenails. If patients have artificial nails, gel coating, etc. that need to be removed by a nail salon, please have this removed prior to surgery or surgery may need to be canceled/delayed if the surgeon/ anesthesia feels like the patient is unable to be adequately monitored.             [facility] is not responsible for any belongings or valuables.  Do NOT Smoke (Tobacco/Vaping)  24 hours prior to your procedure  If you use a CPAP at night, you may bring your mask for your overnight stay.   Contacts, glasses, hearing aids, dentures or partials may not be worn into surgery, please bring cases for these belongings   For patients admitted to the hospital, discharge time will be determined by your treatment team.   Patients discharged the day of surgery will not be allowed to drive home, and someone needs to stay with them for 24 hours.  NO VISITORS WILL BE ALLOWED IN PRE-OP WHERE PATIENTS ARE PREPPED FOR SURGERY.  ONLY 1 SUPPORT PERSON MAY BE PRESENT IN THE WAITING ROOM WHILE YOU ARE IN SURGERY.  IF YOU ARE TO BE ADMITTED, ONCE YOU ARE IN YOUR ROOM YOU WILL BE ALLOWED TWO (2) VISITORS. 1 (ONE) VISITOR MAY STAY OVERNIGHT BUT MUST ARRIVE TO THE ROOM BY 8pm.  Minor children may have two parents present. Special consideration for  safety and communication needs will be reviewed on a case by case basis.  Special instructions:    Oral Hygiene is also important to reduce your risk of infection.  Remember - BRUSH YOUR TEETH THE MORNING OF SURGERY WITH YOUR REGULAR TOOTHPASTE   [facility]- Preparing For Surgery  Before surgery, you can play an important role. Because skin is not sterile, your skin needs to be as free of germs as possible. You can reduce the number of germs on your skin by washing with CHG (chlorahexidine gluconate) Soap before surgery.  CHG is an antiseptic cleaner which kills germs and bonds with the skin to continue killing germs  even after washing.     Please do not use if you have an allergy to CHG or antibacterial soaps. If your skin becomes reddened/irritated stop using the CHG.  Do not shave (including legs and underarms) for at least 48 hours prior to first CHG shower. It is OK to shave your face.  Please follow these instructions carefully.     Shower the NIGHT BEFORE SURGERY and the MORNING OF SURGERY with CHG Soap.   If you chose to wash your hair, wash your hair first as usual with your normal shampoo. After you shampoo, rinse your hair and body thoroughly to remove the shampoo.  Then [employer] and genitals (private parts) with your normal soap and rinse thoroughly to remove soap.  After that Use CHG Soap as you would any other liquid soap. You can apply CHG directly to the skin and wash gently with a scrungie or a clean washcloth.   Apply the CHG Soap to your body ONLY FROM THE NECK DOWN.  Do not use on open wounds or open sores. Avoid contact with your eyes, ears, mouth and genitals (private parts). Wash Face and genitals (private parts)  with your normal soap.   Wash thoroughly, paying special attention to the area where your surgery will be performed.  Thoroughly rinse your body with warm water from the neck down.  DO NOT shower/wash with your normal soap after using and rinsing off the CHG Soap.  Pat yourself dry with a CLEAN TOWEL.  Wear CLEAN PAJAMAS to bed the night before surgery  Place CLEAN SHEETS on your bed the night before your surgery  DO NOT SLEEP WITH PETS.   Day of Surgery:  Take a shower with CHG soap. Wear Clean/Comfortable clothing the morning of surgery Do not apply any deodorants/lotions.   Remember to brush your teeth WITH YOUR REGULAR TOOTHPASTE.   Please read over the following fact sheets that you were given.

## 2021-04-12 ENCOUNTER — Other Ambulatory Visit: Payer: Self-pay

## 2021-04-12 ENCOUNTER — Encounter (HOSPITAL_COMMUNITY): Payer: Self-pay

## 2021-04-12 ENCOUNTER — Encounter (HOSPITAL_COMMUNITY)
Admission: RE | Admit: 2021-04-12 | Discharge: 2021-04-12 | Disposition: A | Discharge status: Home / Self Care | Payer: BC Managed Care – PPO | Source: Ambulatory Visit | Attending: General Surgery | Admitting: General Surgery

## 2021-04-12 DIAGNOSIS — Z01812 Encounter for preprocedural laboratory examination: Secondary | ICD-10-CM | POA: Insufficient documentation

## 2021-04-12 HISTORY — DX: Family history of other specified conditions: Z84.89

## 2021-04-12 NOTE — Progress Notes (Signed)
PCP - Waunita Schooner, MD Cardiologist - n/a  PPM/ICD - n/a Device Orders -  Rep Notified -   Chest x-ray - n/a EKG - n/a Stress Test - n/a ECHO - 04/05/21 (baseline before starting CA treatments) Cardiac Cath - n/a  Sleep Study - n/a CPAP -   Fasting Blood Sugar - n/a Checks Blood Sugar _____ times a day  Blood Thinner Instructions: n/a Aspirin Instructions: n/a  ERAS Protcol - clears until 1145 PRE-SURGERY Ensure or G2- none ordered  COVID TEST- Ambulatory surgery, no covid test required   Anesthesia review: n/a  Patient denies shortness of breath, fever, cough and chest pain at PAT appointment   All instructions explained to the patient, with a verbal understanding of the material. Patient agrees to go over the instructions while at home for a better understanding. Patient also instructed to self quarantine after being tested for COVID-19. The opportunity to ask questions was provided.

## 2021-04-20 ENCOUNTER — Encounter (HOSPITAL_COMMUNITY)
Admission: RE | Disposition: A | Discharge status: Home / Self Care | Payer: Self-pay | Source: Ambulatory Visit | Attending: General Surgery

## 2021-04-20 ENCOUNTER — Ambulatory Visit (HOSPITAL_COMMUNITY): Payer: BC Managed Care – PPO | Admitting: Physician Assistant

## 2021-04-20 ENCOUNTER — Ambulatory Visit (HOSPITAL_COMMUNITY): Payer: BC Managed Care – PPO

## 2021-04-20 ENCOUNTER — Other Ambulatory Visit: Payer: Self-pay

## 2021-04-20 ENCOUNTER — Ambulatory Visit (HOSPITAL_COMMUNITY)
Admission: RE | Admit: 2021-04-20 | Discharge: 2021-04-20 | Disposition: A | Discharge status: Home / Self Care | Payer: BC Managed Care – PPO | Source: Ambulatory Visit | Attending: General Surgery | Admitting: General Surgery

## 2021-04-20 ENCOUNTER — Encounter (HOSPITAL_COMMUNITY): Payer: Self-pay | Admitting: General Surgery

## 2021-04-20 ENCOUNTER — Ambulatory Visit (HOSPITAL_COMMUNITY): Payer: BC Managed Care – PPO | Admitting: Anesthesiology

## 2021-04-20 DIAGNOSIS — C50411 Malignant neoplasm of upper-outer quadrant of right female breast: Secondary | ICD-10-CM | POA: Insufficient documentation

## 2021-04-20 DIAGNOSIS — Z170 Estrogen receptor positive status [ER+]: Secondary | ICD-10-CM | POA: Diagnosis not present

## 2021-04-20 DIAGNOSIS — Z803 Family history of malignant neoplasm of breast: Secondary | ICD-10-CM | POA: Insufficient documentation

## 2021-04-20 DIAGNOSIS — Z95828 Presence of other vascular implants and grafts: Secondary | ICD-10-CM

## 2021-04-20 HISTORY — PX: PORTACATH PLACEMENT: SHX2246

## 2021-04-20 HISTORY — PX: BREAST LUMPECTOMY WITH SENTINEL LYMPH NODE BIOPSY: SHX5597

## 2021-04-20 HISTORY — PX: AXILLARY SENTINEL NODE BIOPSY: SHX5738

## 2021-04-20 SURGERY — BREAST LUMPECTOMY WITH SENTINEL LYMPH NODE BX
Anesthesia: Regional | Site: Chest | Laterality: Right

## 2021-04-20 MED ORDER — ACETAMINOPHEN 10 MG/ML IV SOLN: 1000.0000 mg | Freq: Once | INTRAVENOUS | Status: DC | PRN

## 2021-04-20 MED ORDER — ONDANSETRON HCL 4 MG/2ML IJ SOLN
INTRAMUSCULAR | Status: DC | PRN
  Administered 2021-04-20: 4 mg via INTRAVENOUS

## 2021-04-20 MED ORDER — PHENYLEPHRINE HCL (PRESSORS) 10 MG/ML IV SOLN
INTRAVENOUS | Status: DC | PRN
  Administered 2021-04-20: 160 ug via INTRAVENOUS
  Administered 2021-04-20: 80 ug via INTRAVENOUS

## 2021-04-20 MED ORDER — DEXAMETHASONE SODIUM PHOSPHATE 10 MG/ML IJ SOLN
INTRAMUSCULAR | Status: AC
  Filled 2021-04-20: qty 1

## 2021-04-20 MED ORDER — MIDAZOLAM HCL 2 MG/2ML IJ SOLN: 1.0000 mg | Freq: Once | INTRAMUSCULAR | Status: AC

## 2021-04-20 MED ORDER — SCOPOLAMINE 1 MG/3DAYS TD PT72
MEDICATED_PATCH | TRANSDERMAL | Status: AC
  Filled 2021-04-20: qty 1

## 2021-04-20 MED ORDER — DEXAMETHASONE SODIUM PHOSPHATE 10 MG/ML IJ SOLN
INTRAMUSCULAR | Status: DC | PRN
  Administered 2021-04-20: 4 mg via INTRAVENOUS

## 2021-04-20 MED ORDER — MIDAZOLAM HCL 2 MG/2ML IJ SOLN
INTRAMUSCULAR | Status: AC
  Filled 2021-04-20: qty 2

## 2021-04-20 MED ORDER — FENTANYL CITRATE (PF) 100 MCG/2ML IJ SOLN
INTRAMUSCULAR | Status: AC
  Administered 2021-04-20: 15:00:00 50 ug via INTRAVENOUS
  Filled 2021-04-20: qty 2

## 2021-04-20 MED ORDER — LIDOCAINE HCL (PF) 1 % IJ SOLN
INTRAMUSCULAR | Status: AC
  Filled 2021-04-20: qty 30

## 2021-04-20 MED ORDER — GLYCOPYRROLATE PF 0.2 MG/ML IJ SOSY
PREFILLED_SYRINGE | INTRAMUSCULAR | Status: DC | PRN
  Administered 2021-04-20: .2 mg via INTRAVENOUS

## 2021-04-20 MED ORDER — FENTANYL CITRATE (PF) 100 MCG/2ML IJ SOLN
INTRAMUSCULAR | Status: DC | PRN
  Administered 2021-04-20: 25 ug via INTRAVENOUS
  Administered 2021-04-20 (×2): 50 ug via INTRAVENOUS
  Administered 2021-04-20: 25 ug via INTRAVENOUS

## 2021-04-20 MED ORDER — HEPARIN 6000 UNIT IRRIGATION SOLUTION
Status: AC
  Filled 2021-04-20: qty 500

## 2021-04-20 MED ORDER — FENTANYL CITRATE (PF) 100 MCG/2ML IJ SOLN: 50.0000 ug | Freq: Once | INTRAMUSCULAR | Status: AC

## 2021-04-20 MED ORDER — EPHEDRINE SULFATE-NACL 50-0.9 MG/10ML-% IV SOSY
PREFILLED_SYRINGE | INTRAVENOUS | Status: DC | PRN
  Administered 2021-04-20: 10 mg via INTRAVENOUS

## 2021-04-20 MED ORDER — MIDAZOLAM HCL 2 MG/2ML IJ SOLN
INTRAMUSCULAR | Status: AC
  Administered 2021-04-20: 15:00:00 1 mg via INTRAVENOUS
  Filled 2021-04-20: qty 2

## 2021-04-20 MED ORDER — PHENYLEPHRINE 40 MCG/ML (10ML) SYRINGE FOR IV PUSH (FOR BLOOD PRESSURE SUPPORT)
PREFILLED_SYRINGE | INTRAVENOUS | Status: DC | PRN
  Administered 2021-04-20: 80 ug via INTRAVENOUS
  Administered 2021-04-20: 120 ug via INTRAVENOUS
  Administered 2021-04-20: 80 ug via INTRAVENOUS
  Administered 2021-04-20: 40 ug via INTRAVENOUS
  Administered 2021-04-20: 80 ug via INTRAVENOUS

## 2021-04-20 MED ORDER — BUPIVACAINE-EPINEPHRINE (PF) 0.25% -1:200000 IJ SOLN
INTRAMUSCULAR | Status: AC
  Filled 2021-04-20: qty 30

## 2021-04-20 MED ORDER — PROMETHAZINE HCL 25 MG/ML IJ SOLN
6.2500 mg | INTRAMUSCULAR | Status: DC | PRN
Start: 2021-04-20 — End: 2021-04-21

## 2021-04-20 MED ORDER — PROPOFOL 10 MG/ML IV BOLUS
INTRAVENOUS | Status: DC | PRN
  Administered 2021-04-20: 140 mg via INTRAVENOUS

## 2021-04-20 MED ORDER — ROPIVACAINE HCL 5 MG/ML IJ SOLN
INTRAMUSCULAR | Status: DC | PRN
  Administered 2021-04-20: 25 mL

## 2021-04-20 MED ORDER — MAGTRACE LYMPHATIC TRACER
INTRAMUSCULAR | Status: DC | PRN
  Administered 2021-04-20: 14:00:00 2 mL via INTRAMUSCULAR

## 2021-04-20 MED ORDER — SCOPOLAMINE 1 MG/3DAYS TD PT72
MEDICATED_PATCH | TRANSDERMAL | Status: DC | PRN
  Administered 2021-04-20: 1 via TRANSDERMAL

## 2021-04-20 MED ORDER — FENTANYL CITRATE (PF) 100 MCG/2ML IJ SOLN: 25.0000 ug | INTRAMUSCULAR | Status: DC | PRN

## 2021-04-20 MED ORDER — HEPARIN SOD (PORK) LOCK FLUSH 100 UNIT/ML IV SOLN
INTRAVENOUS | Status: AC
  Filled 2021-04-20: qty 5

## 2021-04-20 MED ORDER — CLONIDINE HCL (ANALGESIA) 100 MCG/ML EP SOLN
EPIDURAL | Status: DC | PRN
  Administered 2021-04-20: 100 ug

## 2021-04-20 MED ORDER — PHENYLEPHRINE 40 MCG/ML (10ML) SYRINGE FOR IV PUSH (FOR BLOOD PRESSURE SUPPORT)
PREFILLED_SYRINGE | INTRAVENOUS | Status: AC
  Filled 2021-04-20: qty 10

## 2021-04-20 MED ORDER — OXYCODONE HCL 5 MG PO TABS: 5.0000 mg | ORAL_TABLET | Freq: Four times a day (QID) | ORAL | 0 refills | Status: DC | PRN

## 2021-04-20 MED ORDER — FENTANYL CITRATE (PF) 250 MCG/5ML IJ SOLN
INTRAMUSCULAR | Status: AC
  Filled 2021-04-20: qty 5

## 2021-04-20 MED ORDER — LIDOCAINE 2% (20 MG/ML) 5 ML SYRINGE
INTRAMUSCULAR | Status: DC | PRN
  Administered 2021-04-20: 80 mg via INTRAVENOUS

## 2021-04-20 MED ORDER — CHLORHEXIDINE GLUCONATE 0.12 % MT SOLN
15.0000 mL | Freq: Once | OROMUCOSAL | Status: AC
  Administered 2021-04-20: 14:00:00 15 mL via OROMUCOSAL
  Filled 2021-04-20: qty 15

## 2021-04-20 MED ORDER — CEFAZOLIN SODIUM-DEXTROSE 2-4 GM/100ML-% IV SOLN
2.0000 g | INTRAVENOUS | Status: AC
  Administered 2021-04-20: 16:00:00 2 g via INTRAVENOUS
  Filled 2021-04-20: qty 100

## 2021-04-20 MED ORDER — ONDANSETRON HCL 4 MG/2ML IJ SOLN
INTRAMUSCULAR | Status: AC
  Filled 2021-04-20: qty 2

## 2021-04-20 MED ORDER — ACETAMINOPHEN 500 MG PO TABS
1000.0000 mg | ORAL_TABLET | ORAL | Status: DC
  Filled 2021-04-20: qty 2

## 2021-04-20 MED ORDER — LACTATED RINGERS IV SOLN: INTRAVENOUS | Status: DC | PRN

## 2021-04-20 MED ORDER — HEPARIN 6000 UNIT IRRIGATION SOLUTION
Status: DC | PRN
  Administered 2021-04-20: 1

## 2021-04-20 MED ORDER — LIDOCAINE HCL 1 % IJ SOLN
INTRAMUSCULAR | Status: DC | PRN
  Administered 2021-04-20: 22 mL

## 2021-04-20 MED ORDER — HEPARIN SOD (PORK) LOCK FLUSH 100 UNIT/ML IV SOLN
INTRAVENOUS | Status: DC | PRN
  Administered 2021-04-20: 500 [IU]

## 2021-04-20 MED ORDER — MIDAZOLAM HCL 5 MG/5ML IJ SOLN
INTRAMUSCULAR | Status: DC | PRN
  Administered 2021-04-20: 2 mg via INTRAVENOUS

## 2021-04-20 MED ORDER — CHLORHEXIDINE GLUCONATE CLOTH 2 % EX PADS: 6.0000 | MEDICATED_PAD | Freq: Once | CUTANEOUS | Status: DC

## 2021-04-20 MED ORDER — LIDOCAINE 2% (20 MG/ML) 5 ML SYRINGE
INTRAMUSCULAR | Status: AC
  Filled 2021-04-20: qty 5

## 2021-04-20 MED ORDER — LACTATED RINGERS IV SOLN: INTRAVENOUS | Status: DC

## 2021-04-20 MED ORDER — 0.9 % SODIUM CHLORIDE (POUR BTL) OPTIME
TOPICAL | Status: DC | PRN
  Administered 2021-04-20: 17:00:00 1000 mL

## 2021-04-20 MED ORDER — ORAL CARE MOUTH RINSE: 15.0000 mL | Freq: Once | OROMUCOSAL | Status: AC

## 2021-04-20 SURGICAL SUPPLY — 77 items
ADH SKN CLS APL DERMABOND .7 (GAUZE/BANDAGES/DRESSINGS) ×9
APL PRP STRL LF DISP 70% ISPRP (MISCELLANEOUS) ×3
BAG COUNTER SPONGE SURGICOUNT (BAG) ×5 IMPLANT
BAG DECANTER FOR FLEXI CONT (MISCELLANEOUS) ×6 IMPLANT
BAG SPNG CNTER NS LX DISP (BAG) ×3
BAG SURGICOUNT SPONGE COUNTING (BAG) ×1
BINDER BREAST LRG (GAUZE/BANDAGES/DRESSINGS) ×2 IMPLANT
BINDER BREAST XLRG (GAUZE/BANDAGES/DRESSINGS) IMPLANT
BNDG COHESIVE 4X5 TAN STRL (GAUZE/BANDAGES/DRESSINGS) ×6 IMPLANT
CANISTER SUCT 3000ML PPV (MISCELLANEOUS) ×6 IMPLANT
CHLORAPREP W/TINT 26 (MISCELLANEOUS) ×6 IMPLANT
CLIP TI LARGE 6 (CLIP) ×2 IMPLANT
CLIP TI MEDIUM 6 (CLIP) ×8 IMPLANT
CLIP VESOCCLUDE LG 6/CT (CLIP) ×6 IMPLANT
CLIP VESOCCLUDE MED 24/CT (CLIP) ×6 IMPLANT
CLOSURE WOUND 1/2 X4 (GAUZE/BANDAGES/DRESSINGS) ×1
CNTNR URN SCR LID CUP LEK RST (MISCELLANEOUS) IMPLANT
CONT SPEC 4OZ STRL OR WHT (MISCELLANEOUS)
COVER PROBE W GEL 5X96 (DRAPES) ×6 IMPLANT
COVER SURGICAL LIGHT HANDLE (MISCELLANEOUS) ×6 IMPLANT
COVER TRANSDUCER ULTRASND GEL (DISPOSABLE) IMPLANT
DECANTER SPIKE VIAL GLASS SM (MISCELLANEOUS) ×12 IMPLANT
DERMABOND ADVANCED (GAUZE/BANDAGES/DRESSINGS) ×6
DERMABOND ADVANCED .7 DNX12 (GAUZE/BANDAGES/DRESSINGS) ×4 IMPLANT
DEVICE DUBIN SPECIMEN MAMMOGRA (MISCELLANEOUS) IMPLANT
DRAPE C-ARM 42X120 X-RAY (DRAPES) ×6 IMPLANT
DRAPE CHEST BREAST 15X10 FENES (DRAPES) ×6 IMPLANT
DRAPE SURG 17X23 STRL (DRAPES) IMPLANT
DRAPE WARM FLUID 44X44 (DRAPES) IMPLANT
ELECT COATED BLADE 2.86 ST (ELECTRODE) ×6 IMPLANT
ELECT REM PT RETURN 9FT ADLT (ELECTROSURGICAL) ×5
ELECTRODE REM PT RTRN 9FT ADLT (ELECTROSURGICAL) ×4 IMPLANT
GAUZE 4X4 16PLY ~~LOC~~+RFID DBL (SPONGE) ×6 IMPLANT
GAUZE SPONGE 4X4 12PLY STRL (GAUZE/BANDAGES/DRESSINGS) ×2 IMPLANT
GEL ULTRASOUND 20GR AQUASONIC (MISCELLANEOUS) IMPLANT
GLOVE SURG ENC MOIS LTX SZ6 (GLOVE) ×6 IMPLANT
GLOVE SURG UNDER LTX SZ6.5 (GLOVE) ×6 IMPLANT
GOWN STRL REUS W/ TWL LRG LVL3 (GOWN DISPOSABLE) ×4 IMPLANT
GOWN STRL REUS W/TWL 2XL LVL3 (GOWN DISPOSABLE) ×6 IMPLANT
GOWN STRL REUS W/TWL LRG LVL3 (GOWN DISPOSABLE) ×5
KIT BASIN OR (CUSTOM PROCEDURE TRAY) ×6 IMPLANT
KIT MARKER MARGIN INK (KITS) ×6 IMPLANT
KIT PORT POWER 8FR ISP CVUE (Port) ×2 IMPLANT
KIT TURNOVER KIT B (KITS) ×6 IMPLANT
LIGHT WAVEGUIDE WIDE FLAT (MISCELLANEOUS) ×2 IMPLANT
NDL 18GX1X1/2 (RX/OR ONLY) (NEEDLE) IMPLANT
NDL FILTER BLUNT 18X1 1/2 (NEEDLE) IMPLANT
NDL HYPO 25GX1X1/2 BEV (NEEDLE) ×4 IMPLANT
NEEDLE 18GX1X1/2 (RX/OR ONLY) (NEEDLE) IMPLANT
NEEDLE 22X1 1/2 (OR ONLY) (NEEDLE) ×6 IMPLANT
NEEDLE FILTER BLUNT 18X 1/2SAF (NEEDLE)
NEEDLE FILTER BLUNT 18X1 1/2 (NEEDLE) IMPLANT
NEEDLE HYPO 25GX1X1/2 BEV (NEEDLE) ×5 IMPLANT
NS IRRIG 1000ML POUR BTL (IV SOLUTION) ×6 IMPLANT
PACK GENERAL/GYN (CUSTOM PROCEDURE TRAY) ×6 IMPLANT
PACK UNIVERSAL I (CUSTOM PROCEDURE TRAY) ×6 IMPLANT
PAD ABD 8X10 STRL (GAUZE/BANDAGES/DRESSINGS) ×2 IMPLANT
PAD ARMBOARD 7.5X6 YLW CONV (MISCELLANEOUS) ×6 IMPLANT
PENCIL BUTTON HOLSTER BLD 10FT (ELECTRODE) ×6 IMPLANT
POSITIONER HEAD DONUT 9IN (MISCELLANEOUS) ×6 IMPLANT
STOCKINETTE IMPERVIOUS 9X36 MD (GAUZE/BANDAGES/DRESSINGS) ×6 IMPLANT
STRIP CLOSURE SKIN 1/2X4 (GAUZE/BANDAGES/DRESSINGS) ×1 IMPLANT
SUT MNCRL AB 4-0 PS2 18 (SUTURE) ×6 IMPLANT
SUT MON AB 4-0 PC3 18 (SUTURE) ×6 IMPLANT
SUT PROLENE 2 0 SH DA (SUTURE) ×12 IMPLANT
SUT VIC AB 3-0 SH 27 (SUTURE) ×5
SUT VIC AB 3-0 SH 27X BRD (SUTURE) ×4 IMPLANT
SUT VIC AB 3-0 SH 8-18 (SUTURE) ×6 IMPLANT
SYR 5ML LUER SLIP (SYRINGE) ×6 IMPLANT
SYR CONTROL 10ML LL (SYRINGE) ×6 IMPLANT
TOWEL GREEN STERILE (TOWEL DISPOSABLE) ×6 IMPLANT
TOWEL GREEN STERILE FF (TOWEL DISPOSABLE) ×6 IMPLANT
TRACER MAGTRACE VIAL (MISCELLANEOUS) ×2 IMPLANT
TRAY LAPAROSCOPIC MC (CUSTOM PROCEDURE TRAY) ×6 IMPLANT
TUBE CONNECTING 12'X1/4 (SUCTIONS)
TUBE CONNECTING 12X1/4 (SUCTIONS) IMPLANT
YANKAUER SUCT BULB TIP NO VENT (SUCTIONS) IMPLANT

## 2021-04-20 NOTE — H&P (Signed)
REFERRING PHYSICIAN: Einar Pheasant  PROVIDER: Georgianne Fick, MD  Care Team: Patient Care Team: Carrolyn Leigh, MD as PCP - General (Family Medicine) Georgianne Fick, MD as Consulting Provider (Surgical Oncology) Blair Promise, MD (Radiation Oncology) Truitt Merle, MD (Hematology and Oncology)   MRN: [id number] DOB: May 11, 1965 DATE OF ENCOUNTER: 03/22/2021  Subjective   Chief Complaint: Breast Cancer   History of Present Illness: Susan Reid is a 55 y.o. female who is seen today as an office consultation at the request of Dr. Einar Pheasant for evaluation of Breast Cancer   Pt presented with a new right breast cancer 02/2021. This was a palpable mass. Diagnostic imaging was performed which showed a 1.6 cm mass at 9 o'clock 5 cm from the nipple. Axillary nodes appeared normal. A core needle biopsy was performed which showed a grade 2 invasive ductal carcinoma, ER+, PR -, Her 2 pending. Ki 67 is 80%.   She got an MRI last year due to family history and dense breasts. She had a biopsy in the upper central right breast which was benign.   Her mother had breast cancer at age 66. Maternal great-grandmother also had breast cancer. Maternal aunt and uncle both had lung cancer. Paternal grandmother had breast cancer age 57.   Diagnostic mammogram/us:03/07/2021 ACR Breast Density Category c: The breast tissue is heterogeneously dense, which may obscure small masses.   FINDINGS: Spot compression tomosynthesis images through the palpable site of concern in the far lateral aspect of the right breast demonstrates an oval mass with indistinct and angular margins measuring approximately 1.8 cm. No suspicious calcifications, masses or areas of distortion are seen in the left breast.   Ultrasound targeted to the palpable site in the right breast at 9 o'clock, 5 cm from the nipple demonstrates an oval hypoechoic mass with indistinct margins measuring 1.6 x 1.5 x 1.2 cm. Blood flow is seen within  the mass on color Doppler imaging. Ultrasound of the right axilla demonstrates multiple normal-appearing lymph nodes.   IMPRESSION: 1. There is a suspicious 1.6 cm mass in the right breast at 9 o'clock.   2. No evidence of right axillary lymphadenopathy.   3. No evidence of malignancy in the left breast.   RECOMMENDATION: 1. Ultrasound-guided biopsy is recommended for the right breast mass at 9 o'clock. The procedure has been scheduled for 03/14/2021 at 11:30 a.m.   2. The patient is overdue for her follow-up MRI. A six-month follow-up MRI was recommended following her benign MRI guided biopsy 02/25/2020.   I have discussed the findings and recommendations with the patient. If applicable, a reminder letter will be sent to the patient regarding the next appointment.   BI-RADS CATEGORY 5: Highly suggestive of malignancy.  Pathology core needle biopsy: 03/14/2021 Breast, right, needle core biopsy, 9:00 o'clock - INVASIVE DUCTAL CARCINOMA Based on the biopsy, the carcinoma appears Nottingham grade 2-3 of 3 and measures 0.9 cm in greatest linear extent.  Receptors: Estrogen Receptor: 60%, POSITIVE, WEAK STAINING INTENSITY Progesterone Receptor: 0%, NEGATIVE Proliferation Marker Ki67: 80% Her 2 -positive  Review of Systems: A complete review of systems was obtained from the patient. I have reviewed this information and discussed as appropriate with the patient. See HPI as well for other ROS.  Review of Systems  Psychiatric/Behavioral: The patient is nervous/anxious (since biopsy).  All other systems reviewed and are negative.   Medical History: Past Medical History:  Diagnosis Date   Anxiety   Patient Active Problem List  Diagnosis  Malignant neoplasm of upper-outer quadrant of right breast in female, estrogen receptor positive (CMS-HCC)   History reviewed. No pertinent surgical history.   Allergies  Allergen Reactions   Nitrate Analogues Anaphylaxis   No  current outpatient medications on file prior to visit.   No current facility-administered medications on file prior to visit.   Family History  Problem Relation Age of Onset   High blood pressure (Hypertension) Mother   Diabetes Mother   Breast cancer Mother   High blood pressure (Hypertension) Sister    Social History   Tobacco Use  Smoking Status Never  Smokeless Tobacco Never    Social History   Socioeconomic History   Marital status: Married  Tobacco Use   Smoking status: Never   Smokeless tobacco: Never  Vaping Use   Vaping Use: Never used  Substance and Sexual Activity   Alcohol use: Yes  Comment: "Once per month"   Drug use: Never   Sexual activity: Defer   Objective:   Vitals:  03/22/21 1559  BP: 122/76  Pulse: 82  Resp: 18  Temp: 37.1 C (98.8 F)  Weight: 55.1 kg (121 lb 6.4 oz)  Height: 162.6 cm (_0 )   Body mass index is 20.84 kg/m.  Gen: No acute distress. Well nourished and well groomed.  Neurological: Alert and oriented to person, place, and time. Coordination normal.  Head: Normocephalic and atraumatic.  Eyes: Conjunctivae are normal. Pupils are equal, round, and reactive to light. No scleral icterus.  Neck: Normal range of motion. Neck supple. No tracheal deviation or thyromegaly present.  Cardiovascular: Normal rate, regular rhythm, normal heart sounds and intact distal pulses. Exam reveals no gallop and no friction rub. No murmur heard. Breast: right breast with palpable mass around 1.5-2 cm in the upper outer quadrant. No nipple retraction, slight contour irregularity at site of mass. Breasts are very dense. There is significant lumpy tissue throughout both breasts and a prominent inframammary fold bilaterally. No LAD palpable.  Respiratory: Effort normal. No respiratory distress. No chest wall tenderness. Breath sounds normal. No wheezes, rales or rhonchi.  GI: Soft. Bowel sounds are normal. The abdomen is soft and nontender. There is no  rebound and no guarding.  Musculoskeletal: Normal range of motion. Extremities are nontender.  Lymphadenopathy: No cervical, preauricular, postauricular or axillary adenopathy is present Skin: Skin is warm and dry. No rash noted. No diaphoresis. No erythema. No pallor. No clubbing, cyanosis, or edema.  Psychiatric: Normal mood and affect. Behavior is normal. Judgment and thought content normal.   Labs 03/22/21 CMET and CBC essentially normal.   Assessment and Plan:   Malignant neoplasm of upper-outer quadrant of right breast in female, estrogen receptor positive (CMS-HCC) Pt has a new diagnosis of cT1cN0 right breast cancer. Will get repeat MRI given breast density as well as positive her 2 status of cancer. If no other findings are present, she is a candidate for breast conservation with a lumpectomy and sentinel node biopsy. She will need a port for chemotherapy.   Surgery and chemo will be followed by recommendation for radiation, and antihormonal tx.   Patient desires breast conservation as long as she is a candidate after MRI findings.   The surgical procedure was described to the patient. I discussed the incision type and location and that we would need radiology involved on with a wire or seed marker and/or sentinel node.   The risks and benefits of the procedure were described to the patient and she wishes to proceed.  We discussed the risks of bleeding, infection, damage to other structures, need for further procedures/surgeries. We discussed the risk of seroma. The patient was advised if the area in the breast in cancer, we may need to go back to surgery for additional tissue to obtain negative margins or for a lymph node biopsy. The patient was advised that these are the most common complications, but that others can occur as well. They were advised against taking aspirin or other anti-inflammatory agents/blood thinners the week before surgery.

## 2021-04-20 NOTE — Discharge Instructions (Addendum)
[facility] Office Phone Number [phone]  BREAST BIOPSY/ PARTIAL MASTECTOMY: POST OP INSTRUCTIONS  Always review your discharge instruction sheet given to you by the facility where your surgery was performed.  IF YOU HAVE DISABILITY OR FAMILY LEAVE FORMS, YOU MUST BRING THEM TO THE OFFICE FOR PROCESSING.  DO NOT GIVE THEM TO YOUR DOCTOR.  A prescription for pain medication may be given to you upon discharge.  Take your pain medication as prescribed, if needed.  If narcotic pain medicine is not needed, then you may take acetaminophen (Tylenol) or ibuprofen (Advil) as needed. Take your usually prescribed medications unless otherwise directed If you need a refill on your pain medication, please contact your pharmacy.  They will contact our office to request authorization.  Prescriptions will not be filled after 5pm or on week-ends. You should eat very light the first 24 hours after surgery, such as soup, crackers, pudding, etc.  Resume your normal diet the day after surgery. Most patients will experience some swelling and bruising in the breast.  Ice packs and a good support bra will help.  Swelling and bruising can take several days to resolve.  It is common to experience some constipation if taking pain medication after surgery.  Increasing fluid intake and taking a stool softener will usually help or prevent this problem from occurring.  A mild laxative (Milk of Magnesia or Miralax) should be taken according to package directions if there are no bowel movements after 48 hours. Unless discharge instructions indicate otherwise, you may remove your bandages 48 hours after surgery, and you may shower at that time.  You may have steri-strips (small skin tapes) in place directly over the incision.  These strips should be left on the skin for 7-10 days.   Any sutures or staples will be removed at the office during your follow-up visit. ACTIVITIES:  You may resume regular daily activities  (gradually increasing) beginning the next day.  Wearing a good support bra or sports bra (or the breast binder) minimizes pain and swelling.  You may have sexual intercourse when it is comfortable. You may drive when you no longer are taking prescription pain medication, you can comfortably wear a seatbelt, and you can safely maneuver your car and apply brakes. RETURN TO WORK:  __________1 week_______________ You should see your doctor in the office for a follow-up appointment approximately two weeks after your surgery.  Your doctor's nurse will typically make your follow-up appointment when she calls you with your pathology report.  Expect your pathology report 2-3 business days after your surgery.  You may call to check if you do not hear from us after three days.   WHEN TO CALL YOUR DOCTOR: Fever over 101.0 Nausea and/or vomiting. Extreme swelling or bruising. Continued bleeding from incision. Increased pain, redness, or drainage from the incision.  The clinic staff is available to answer your questions during regular business hours.  Please don't hesitate to call and ask to speak to one of the nurses for clinical concerns.  If you have a medical emergency, go to the nearest emergency room or call 911.  A surgeon from [facility] is always on call at the hospital.  For further questions, please visit [email or web address]   

## 2021-04-20 NOTE — Anesthesia Preprocedure Evaluation (Signed)
Anesthesia Evaluation  Patient identified by MRN, date of birth, ID band Patient awake    Reviewed: Allergy & Precautions, NPO status , Patient's Chart, lab work & pertinent test results  History of Anesthesia Complications (+) Family history of anesthesia reaction  Airway Mallampati: II  TM Distance: >3 FB Neck ROM: Full    Dental no notable dental hx.    Pulmonary neg pulmonary ROS,    Pulmonary exam normal        Cardiovascular negative cardio ROS   Rhythm:Regular Rate:Normal     Neuro/Psych negative neurological ROS  negative psych ROS   GI/Hepatic negative GI ROS, Neg liver ROS,   Endo/Other  negative endocrine ROS  Renal/GU negative Renal ROS  negative genitourinary   Musculoskeletal Breast Ca   Abdominal Normal abdominal exam  (+)   Peds  Hematology negative hematology ROS (+)   Anesthesia Other Findings   Reproductive/Obstetrics                             Anesthesia Physical Anesthesia Plan  ASA: 2  Anesthesia Plan: General and Regional   Post-op Pain Management: Regional block   Induction: Intravenous  PONV Risk Score and Plan: 3 and Ondansetron, Dexamethasone, Midazolam and Treatment may vary due to age or medical condition  Airway Management Planned: Mask and LMA  Additional Equipment: None  Intra-op Plan:   Post-operative Plan: Extubation in OR  Informed Consent: I have reviewed the patients History and Physical, chart, labs and discussed the procedure including the risks, benefits and alternatives for the proposed anesthesia with the patient or authorized representative who has indicated his/her understanding and acceptance.     Dental advisory given  Plan Discussed with: CRNA  Anesthesia Plan Comments:         Anesthesia Quick Evaluation

## 2021-04-20 NOTE — Transfer of Care (Signed)
Immediate Anesthesia Transfer of Care Note  Patient: Susan Reid  Procedure(s) Performed: RIGHT BREAST LUMPECTOMY (Right: Breast) INSERTION PORT-A-CATH (Left: Chest) RIGHT AXILLARY SENTINEL NODE BIOPSY (Right: Axilla)  Patient Location: PACU  Anesthesia Type:GA combined with regional for post-op pain  Level of Consciousness: awake, alert  and patient cooperative  Airway & Oxygen Therapy: Patient Spontanous Breathing  Post-op Assessment: Report given to RN and Post -op Vital signs reviewed and stable  Post vital signs: Reviewed and stable  Last Vitals:  Vitals Value Taken Time  BP 131/67 04/20/21 1816  Temp    Pulse 85 04/20/21 1818  Resp 12 04/20/21 1818  SpO2 99 % 04/20/21 1818  Vitals shown include unvalidated device data.  Last Pain:  Vitals:   04/20/21 1330  TempSrc:   PainSc: 0-No pain         Complications: No notable events documented.

## 2021-04-20 NOTE — Anesthesia Procedure Notes (Signed)
Procedure Name: LMA Insertion Date/Time: 04/20/2021 4:07 PM Performed by: Cathren Harsh, CRNA Pre-anesthesia Checklist: Patient identified, Emergency Drugs available, Suction available and Patient being monitored Patient Re-evaluated:Patient Re-evaluated prior to induction Oxygen Delivery Method: Circle System Utilized Preoxygenation: Pre-oxygenation with 100% oxygen Induction Type: IV induction Ventilation: Mask ventilation without difficulty LMA: LMA inserted LMA Size: 4.0 Number of attempts: 1 Airway Equipment and Method: Bite block Placement Confirmation: positive ETCO2 Tube secured with: Tape Dental Injury: Teeth and Oropharynx as per pre-operative assessment

## 2021-04-20 NOTE — Op Note (Signed)
Right Breast Lumpectomy with Sentinel Node Mapping and Biopsy, left subclavian port placement ° °Indications: This patient presents with history of right breast cancer with clinically negative axillary lymph node exam. 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 The risks, benefits, complications, treatment options, and expected outcomes were discussed with the patient. The possibilities of reaction to medication, pulmonary aspiration, bleeding, infection, the need for additional procedures, failure to diagnose a condition, and creating a complication requiring transfusion or operation were discussed with the patient. The patient concurred with the proposed plan, giving informed consent.  The site of surgery properly noted/marked. The patient was taken to Operating Room # 9, identified as Susan Reid and the procedure verified as right Breast Lumpectomy and Sentinel Node mapping and biopsy, port placement. A Time Out was held and the above information confirmed.  The MagTrace was injected in the right subareolar space.   ° °The bilateral chest and neck were prepped and draped in sterile fashion.    Local anesthetic was administered over this  area at the angle of the clavicle.  The vein was accessed with 1 pass(es) of the needle. There was good venous return and the wire passed easily with no ectopy.  Fluoroscopy was used to confirm that the wire was in the vena cava.  °   ° The patient was placed back level and the area for the pocket was anethetized  ° with local anesthetic.  A 3-cm transverse incision was made with a #15  ° blade.  Cautery was used to divide the subcutaneous tissues  down to the  ° pectoralis muscle.  An [employer] retractor was used to elevate the skin  ° while a pocket was created on top of the pectoralis fascia.  The port  ° was placed into the pocket to confirm that it was of adequate size.  The  ° catheter was preattached to the port.  The port was then secured to the  ° pectoralis fascia with four 2-0 Prolene sutures.  These were clamped and  ° not tied down yet.   ° °The catheter was tunneled through to the wire exit  ° site.  The catheter was placed along the wire to determine what length it should be to be in the SVC.  The catheter was cut at 22 cm.  The tunneler sheath and dilator were passed over the wire and the dilator and wire were removed.  The catheter was advanced through the tunneler sheath and the tunneler sheath was pulled away.  Care was taken to keep the catheter in the tunneler sheath as this occurred. This was advanced and the tunneler sheath was removed.  There was good venous  ° return and easy flush of the catheter.  The Prolene sutures were tied  ° down to the pectoral fascia.  The skin was reapproximated using 3-0  ° Vicryl interrupted deep dermal sutures.   ° °Fluoroscopy was used to re-confirm good position of the catheter.  The skin  ° was then closed using 4-0 Monocryl in a subcuticular fashion.  The port was flushed with concentrated heparin flush as well.  The wounds were then cleaned, dried, and dressed with Dermabond.  The drapes   were removed and the patient was repositioned with the shoulder roll also being removed.   ° °The right arm, breast, and chest were prepped and draped in standard fashion.  The lumpectomy was performed by creating a transverse lateral incision near the mass.  Skin hooks were used to elevate the skin and the cautery was used to dissect around the mass.  Dissection was carried down to the pectoral fascia.  The specimen was marked with the margin marker paint kit.  The specimen was placed in the faxatron to confirm  presence of the biopsy clip.  Hemostasis was achieved with cautery.  Large clips were placed on the specimen cavity edges for radiation.   The wound was irrigated and closed with a 3-0 Vicryl deep dermal interrupted and a 4-0 Monocryl subcuticular closure in layers. ° °Using a hand-held SentiMag probe, axillary sentinel nodes were identified transcutaneously.  An oblique incision was created below the axillary hairline.  Dissection was carried through the clavipectoral fascia.   Four deep level 2 axillary sentinel nodes were removed. Lymphovascular channels were clipped.  The wound was irrigated.  Hemostasis was achieved with cautery and clips.  The axillary incision was closed with 3-0 vicryl deep dermal interrupted sutures and 4-0 monocryl subcuticular closure in layers. °     °Sterile dressings were applied. At the end of the operation, all sponge, instrument, and needle counts were correct. ° °Findings: °Grossly clear surgical margins.  Anterior margin is skin, posterior margin is pectoralis.  SLN #1 cps around 2500, SLN #2 cps around 1900, SLN #3 cps 450, SLN #4 cps 1500. Background was around 10 ° °Estimated Blood Loss:  Minimal °        °Specimens: right breast tissue, Four right axillary sentinel nodes °               °Complications:  None; patient tolerated the procedure well. °        °Disposition: PACU - hemodynamically stable. °        °Condition: stable ° °   ° °

## 2021-04-20 NOTE — Interval H&P Note (Signed)
History and Physical Interval Note:  04/20/2021 1:55 PM  Susan Reid  has presented today for surgery, with the diagnosis of RIGHT BREAST CANCER.  The various methods of treatment have been discussed with the patient and family. After consideration of risks, benefits and other options for treatment, the patient has consented to  Procedure(s): RIGHT BREAST LUMPECTOMY WITH SENTINEL LYMPH NODE BX (Right) INSERTION PORT-A-CATH (N/A) as a surgical intervention.  The patient's history has been reviewed, patient examined, no change in status, stable for surgery.  I have reviewed the patient's chart and labs.  Questions were answered to the patient's satisfaction.     Stark Klein

## 2021-04-20 NOTE — Anesthesia Procedure Notes (Signed)
Anesthesia Regional Block: Pectoralis block   Pre-Anesthetic Checklist: , timeout performed,  Correct Patient, Correct Site, Correct Laterality,  Correct Procedure, Correct Position, site marked,  Risks and benefits discussed,  Surgical consent,  Pre-op evaluation,  At surgeon's request and post-op pain management  Laterality: Right  Prep: Dura Prep       Needles:  Injection technique: Single-shot  Needle Type: Echogenic Stimulator Needle     Needle Length: 5cm  Needle Gauge: 20     Additional Needles:   Procedures:,,,, ultrasound used (permanent image in chart),,    Narrative:  Start time: 04/20/2021 2:18 PM End time: 04/20/2021 2:21 PM Injection made incrementally with aspirations every 5 mL.  Performed by: Personally  Anesthesiologist: Darral Dash, DO  Additional Notes: Patient identified. Risks/Benefits/Options discussed with patient including but not limited to bleeding, infection, nerve damage, failed block, incomplete pain control. Patient expressed understanding and wished to proceed. All questions were answered. Sterile technique was used throughout the entire procedure. Please see nursing notes for vital signs. Aspirated in 5cc intervals with injection for negative confirmation. Patient was given instructions on fall risk and not to get out of bed. All questions and concerns addressed with instructions to call with any issues or inadequate analgesia.

## 2021-04-21 ENCOUNTER — Encounter (HOSPITAL_COMMUNITY): Payer: Self-pay | Admitting: General Surgery

## 2021-04-22 NOTE — Anesthesia Postprocedure Evaluation (Signed)
Anesthesia Post Note  Patient: KAYLEEN ALIG  Procedure(s) Performed: RIGHT BREAST LUMPECTOMY (Right: Breast) INSERTION PORT-A-CATH (Left: Chest) RIGHT AXILLARY SENTINEL NODE BIOPSY (Right: Axilla)     Patient location during evaluation: PACU Anesthesia Type: Regional and General Level of consciousness: awake and alert, awake and oriented Pain management: pain level controlled Vital Signs Assessment: post-procedure vital signs reviewed and stable Respiratory status: spontaneous breathing, nonlabored ventilation, respiratory function stable and patient connected to nasal cannula oxygen Cardiovascular status: blood pressure returned to baseline and stable Postop Assessment: no apparent nausea or vomiting Anesthetic complications: no   No notable events documented.  Last Vitals:  Vitals:   04/20/21 1830 04/20/21 1845  BP: 126/72 131/74  Pulse: 77 79  Resp: 11 14  Temp:  36.7 C  SpO2: 99% 100%    Last Pain:  Vitals:   04/20/21 1845  TempSrc:   PainSc: 4                  Catalina Gravel

## 2021-04-27 ENCOUNTER — Encounter: Payer: Self-pay | Admitting: *Deleted

## 2021-04-27 LAB — SURGICAL PATHOLOGY

## 2021-05-02 NOTE — Assessment & Plan Note (Addendum)
03/14/2021:Palpable right breast mass diagnostic mammogram: 1.6 cm mass in the right breast at 9 o'clock. 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   04/20/21: Rt Lumpectomy: Grade 3 IDC 1.9 cm 0/5 Ln neg, Margins Neg, Her2+, Copy #5.85, ratio 3.16 ER+(60%)/PR+(0%), Ki-67 80%.   Pathology counseling: I discussed the final pathology report of the patient provided  a copy of this report. I discussed the margins as well as lymph node surgeries. We also discussed the final staging along with previously performed ER/PR and HER-2/neu testing.  Treatment Plan: 1. adjuvant chemotherapy with Taxol Herceptin followed by Herceptin maintenance 2.  Adjuvant radiation therapy 3.  Adjuvant antiestrogen therapy  RTC to start chemo

## 2021-05-03 ENCOUNTER — Inpatient Hospital Stay: Payer: Managed Care, Other (non HMO)

## 2021-05-03 ENCOUNTER — Encounter: Payer: Self-pay | Admitting: *Deleted

## 2021-05-03 ENCOUNTER — Other Ambulatory Visit: Payer: Self-pay

## 2021-05-03 ENCOUNTER — Inpatient Hospital Stay: Payer: Managed Care, Other (non HMO) | Attending: Hematology and Oncology | Admitting: Hematology and Oncology

## 2021-05-03 VITALS — BP 126/74 | HR 76 | Temp 97.8°F | Resp 16 | Ht 65.0 in | Wt 124.2 lb

## 2021-05-03 DIAGNOSIS — Z170 Estrogen receptor positive status [ER+]: Secondary | ICD-10-CM | POA: Insufficient documentation

## 2021-05-03 DIAGNOSIS — Z5111 Encounter for antineoplastic chemotherapy: Secondary | ICD-10-CM | POA: Diagnosis present

## 2021-05-03 DIAGNOSIS — C50411 Malignant neoplasm of upper-outer quadrant of right female breast: Secondary | ICD-10-CM | POA: Diagnosis present

## 2021-05-03 DIAGNOSIS — Z5112 Encounter for antineoplastic immunotherapy: Secondary | ICD-10-CM | POA: Diagnosis present

## 2021-05-03 MED ORDER — PROCHLORPERAZINE MALEATE 10 MG PO TABS: 10.0000 mg | ORAL_TABLET | Freq: Four times a day (QID) | ORAL | 1 refills | Status: DC | PRN

## 2021-05-03 MED ORDER — ONDANSETRON HCL 8 MG PO TABS: 8.0000 mg | ORAL_TABLET | Freq: Two times a day (BID) | ORAL | 1 refills | Status: DC | PRN

## 2021-05-03 MED ORDER — LIDOCAINE-PRILOCAINE 2.5-2.5 % EX CREA: TOPICAL_CREAM | CUTANEOUS | 3 refills | Status: DC

## 2021-05-03 MED ORDER — LORAZEPAM 0.5 MG PO TABS: 0.5000 mg | ORAL_TABLET | Freq: Every evening | ORAL | 0 refills | Status: DC | PRN

## 2021-05-03 NOTE — Progress Notes (Signed)
Patient Care Team: Lesleigh Noe, MD as PCP - General (Family Medicine) Druscilla Brownie, MD as Consulting Physician (Dermatology) Rockwell Germany, RN as Oncology Nurse Navigator Tressie Dattilo, Paulette Blanch, RN as Oncology Nurse Navigator Stark Klein, MD as Consulting Physician (General Surgery) [name], MD as Consulting Physician (Hematology and Oncology) Gery Pray, MD as Consulting Physician (Radiation Oncology)  DIAGNOSIS:  Encounter Diagnosis  Name Primary?   Malignant neoplasm of upper-outer quadrant of right breast in female, estrogen receptor positive ([place])     SUMMARY OF ONCOLOGIC HISTORY: Oncology History  Malignant neoplasm of upper-outer quadrant of right breast in female, estrogen receptor positive ([place])  03/14/2021 Initial Diagnosis   Palpable right breast mass diagnostic mammogram: 1.6 cm mass in the right breast at 9 o'clock. 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    03/22/2021 Cancer Staging   Staging form: Breast, AJCC 8th Edition - Clinical stage from 03/22/2021: Stage IA (cT1b, cN0, cM0, G3, ER+, PR-, HER2+) - Signed by [name], MD on 03/22/2021 Stage prefix: Initial diagnosis Histologic grading system: 3 grade system     Genetic Testing   [facility] CustomNext Panel was Negative. Report date is 04/03/2021.  The CustomNext-Cancer+RNAinsight panel offered by [facility] includes sequencing and rearrangement analysis for the following 47 genes:  APC, ATM, AXIN2, BARD1, BMPR1A, BRCA1, BRCA2, BRIP1, CDH1, CDK4, CDKN2A, CHEK2, CTNNA1, DICER1, EPCAM, GREM1, HOXB13, KIT, MEN1, MLH1, MSH2, MSH3, MSH6, MUTYH, NBN, NF1, NTHL1, PALB2, PDGFRA, PMS2, POLD1, POLE, PTEN, RAD50, RAD51C, RAD51D, SDHA, SDHB, SDHC, SDHD, SMAD4, SMARCA4, STK11, TP53, TSC1, TSC2, and VHL.  RNA data is routinely analyzed for use in variant interpretation for all genes.     CHIEF COMPLIANT:   INTERVAL HISTORY: Susan Reid  is a   ALLERGIES:  is allergic to nitrates, organic.  MEDICATIONS:  Current Outpatient Medications  Medication Sig Dispense Refill   acetaminophen (TYLENOL) 325 MG tablet Take 1,000 mg by mouth every 6 (six) hours as needed.     Ascorbic Acid (VITAMIN C PO) Take 500 mg by mouth daily.     Calcium Citrate 1040 MG TABS Take 2,080 mg of elemental calcium by mouth daily.     cholecalciferol (VITAMIN D) 1000 UNITS tablet Take 1,000 Units by mouth daily.     fluticasone (FLONASE) 50 MCG/ACT nasal spray Place 1 spray into both nostrils daily as needed for allergies or rhinitis.     loratadine (CLARITIN) 10 MG tablet Take 10 mg by mouth daily as needed for allergies.     Multiple Vitamin (MULTIVITAMIN) tablet Take 1 tablet by mouth daily.     oxyCODONE (OXY IR/ROXICODONE) 5 MG immediate release tablet Take 1 tablet (5 mg total) by mouth every 6 (six) hours as needed for severe pain. 5 tablet 0   No current facility-administered medications for this visit.    PHYSICAL EXAMINATION: ECOG PERFORMANCE STATUS: 1 - Symptomatic but completely ambulatory  Vitals:   05/03/21 1031  BP: 126/74  Pulse: 76  Resp: 16  Temp: 97.8 F (36.6 C)  SpO2: 100%   Filed Weights   05/03/21 1031  Weight: 124 lb 3.2 oz (56.3 kg)    BREAST: No palpable masses or nodules in either right or left breasts. No palpable axillary supraclavicular or infraclavicular adenopathy no breast tenderness or nipple discharge. (exam performed in the presence of a chaperone)  LABORATORY DATA:  I have reviewed the data as listed CMP Latest Ref Rng & Units 03/22/2021 02/02/2021  03/07/2020  Glucose 70 - 99 mg/dL 101(H) 129(H) 94  BUN 6 - 20 mg/dL 17 17 12   Creatinine 0.44 - 1.00 mg/dL 0.84 0.86 0.94  Sodium 135 - 145 mmol/L 139 139 140  Potassium 3.5 - 5.1 mmol/L 4.3 4.0 4.5  Chloride 98 - 111 mmol/L 104 102 101  CO2 22 - 32 mmol/L 28 29 33(H)  Calcium 8.9 - 10.3 mg/dL 9.6 9.6 9.8  Total Protein 6.5 - 8.1 g/dL 7.8 6.7 7.4   Total Bilirubin 0.3 - 1.2 mg/dL 0.5 0.4 0.4  Alkaline Phos 38 - 126 U/L 89 88 88  AST 15 - 41 U/L 18 19 23   ALT 0 - 44 U/L 15 17 20     Lab Results  Component Value Date   WBC 9.1 03/22/2021   HGB 14.0 03/22/2021   HCT 42.3 03/22/2021   MCV 92.2 03/22/2021   PLT 294 03/22/2021   NEUTROABS 7.3 03/22/2021    ASSESSMENT & PLAN:  Malignant neoplasm of upper-outer quadrant of right breast in female, estrogen receptor positive ([place]) 03/14/2021:Palpable right breast mass diagnostic mammogram: 1.6 cm mass in the right breast at 9 o'clock. 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   04/20/21: Rt Lumpectomy: Grade 3 IDC 0/5 Ln neg, Margins Neg, Her2+, Copy #5.85, ratio 3.16 ER+(60%)/PR+(0%), Ki-67 80%.   Pathology counseling: I discussed the final pathology report of the patient provided  a copy of this report. I discussed the margins as well as lymph node surgeries. We also discussed the final staging along with previously performed ER/PR and HER-2/neu testing.  Treatment Plan: 1. adjuvant chemotherapy with Taxol Herceptin followed by Herceptin maintenance 2.  Adjuvant radiation therapy 3.  Adjuvant antiestrogen therapy  RTC to start chemo   No orders of the defined types were placed in this encounter.  The patient has a good understanding of the overall plan. she agrees with it. she will call with any problems that may develop before the next visit here. Total time spent: 30 mins including face to face time and time spent for planning, charting and co-ordination of care   Harriette Ohara, MD 05/03/21

## 2021-05-03 NOTE — Progress Notes (Signed)
START ON PATHWAY REGIMEN - Breast     Cycle 1: A cycle is 7 days:     Trastuzumab-xxxx      Paclitaxel    Cycles 2 through 12: A cycle is every 7 days:     Trastuzumab-xxxx      Paclitaxel    Cycles 13 through 25: A cycle is every 21 days:     Trastuzumab-xxxx   **Always confirm dose/schedule in your pharmacy ordering system**  Patient Characteristics: Postoperative without Neoadjuvant Therapy (Pathologic Staging), Invasive Disease, Adjuvant Therapy, HER2 Positive, ER Positive, Node Negative, pT1c, pN0/N98m Therapeutic Status: Postoperative without Neoadjuvant Therapy (Pathologic Staging) AJCC Grade: G3 AJCC N Category: pN0 AJCC M Category: cM0 ER Status: Positive (+) AJCC 8 Stage Grouping: IA HER2 Status: Positive (+) Oncotype Dx Recurrence Score: Not Appropriate AJCC T Category: pT1c PR Status: Negative (-) Adjuvant Therapy Status: No Adjuvant Therapy Received Yet or Changing Initial Adjuvant Regimen due to Tolerance Intent of Therapy: Curative Intent, Discussed with Patient

## 2021-05-11 ENCOUNTER — Other Ambulatory Visit: Payer: Self-pay

## 2021-05-11 ENCOUNTER — Ambulatory Visit: Payer: Managed Care, Other (non HMO) | Attending: General Surgery | Admitting: Physical Therapy

## 2021-05-11 ENCOUNTER — Encounter: Payer: Self-pay | Admitting: Physical Therapy

## 2021-05-11 DIAGNOSIS — Z483 Aftercare following surgery for neoplasm: Secondary | ICD-10-CM | POA: Insufficient documentation

## 2021-05-11 DIAGNOSIS — Z170 Estrogen receptor positive status [ER+]: Secondary | ICD-10-CM | POA: Insufficient documentation

## 2021-05-11 DIAGNOSIS — C50411 Malignant neoplasm of upper-outer quadrant of right female breast: Secondary | ICD-10-CM | POA: Insufficient documentation

## 2021-05-11 DIAGNOSIS — M79601 Pain in right arm: Secondary | ICD-10-CM | POA: Diagnosis present

## 2021-05-11 DIAGNOSIS — R293 Abnormal posture: Secondary | ICD-10-CM | POA: Diagnosis present

## 2021-05-11 NOTE — Patient Instructions (Addendum)
° ° °   [facility]  [address]  [place] [address]  [phone]  After Breast Cancer Class It is recommended you attend the ABC class to be educated on lymphedema risk reduction. This class is free of charge and lasts for 1 hour. It is a 1-time class. You will need to download the Webex app either on your phone or computer. We will send you a link the night before or the morning of the class. You should be able to click on that link to join the class. This is not a confidential class. You don't have to turn your camera on, but other participants may be able to see your email address. You are scheduled for June 12, 2021.  Scar massage You can begin gentle scar massage to you incision sites. Gently place one hand on the incision and move the skin (without sliding on the skin) in various directions. Do this for a few minutes and then you can gently massage either coconut oil or vitamin E cream into the scars.  Compression garment You should continue wearing your compression bra until you feel like you no longer have swelling.  Home exercise Program Continue doing the exercises you were given until you feel like you can do them without feeling any tightness at the end.   Walking Program Studies show that 30 minutes of walking per day (fast enough to elevate your heart rate) can significantly reduce the risk of a cancer recurrence. If you can't walk due to other medical reasons, we encourage you to find another activity you could do (like a stationary bike or water exercise).  Posture After breast cancer surgery, people frequently sit with rounded shoulders posture because it puts their incisions on slack and feels better. If you sit like this and scar tissue forms in that position, you can become very tight and have pain sitting or standing with good posture. Try to be aware of your posture and sit and stand up tall to heal properly.  Follow up PT: It is  recommended you return every 3 months for the first 3 years following surgery to be assessed on the SOZO machine for an L-Dex score. This helps prevent clinically significant lymphedema in 95% of patients. These follow up screens are 10 minute appointments that you are not billed for. You are scheduled for July 17, 2021 at 9:40.  MEDIAN NERVE: Mobilization XI    Stand with right palm flat on wall, fingers back, elbow bent, head tilted away. Sidestep away from wall, straightening elbow. Do __2_ sets of __5_ repetitions per day. Hold each rep for 5 seconds.  Copyright  VHI. All rights reserved.

## 2021-05-11 NOTE — Therapy (Signed)
[facility] @ [place], [place], [address] Phone: [phone]   Fax:  [phone]  Physical Therapy Treatment  Patient Details  Name: Susan Reid MRN: [id number] Date of Birth: 1966/01/12 Referring Provider (PT): Dr. Stark Klein   Encounter Date: 05/11/2021   PT End of Session - 05/11/21 1321     Visit Number 2    Number of Visits 10    Date for PT Re-Evaluation 06/08/21    PT Start Time 1105    PT Stop Time 1200    PT Time Calculation (min) 55 min    Activity Tolerance Patient tolerated treatment well    Behavior During Therapy Susan Reid for tasks assessed/performed             Past Medical History:  Diagnosis Date   Allergy    Family history of adverse reaction to anesthesia    Mother got severe N/V and pneumonia following anesthesia   Osteopenia     Past Surgical History:  Procedure Laterality Date   AXILLARY SENTINEL NODE BIOPSY Right 04/20/2021   Procedure: RIGHT AXILLARY SENTINEL NODE BIOPSY;  Surgeon: Stark Klein, MD;  Location: [facility];  Service: General;  Laterality: Right;   BREAST BIOPSY Right 02/25/2020   [place]   BREAST BIOPSY Right 03/14/2021   BREAST LUMPECTOMY WITH SENTINEL LYMPH NODE BIOPSY Right 04/20/2021   Procedure: RIGHT BREAST LUMPECTOMY;  Surgeon: Stark Klein, MD;  Location: [facility];  Service: General;  Laterality: Right;   PORTACATH PLACEMENT Left 04/20/2021   Procedure: INSERTION PORT-A-CATH;  Surgeon: Stark Klein, MD;  Location: [facility];  Service: General;  Laterality: Left;   WISDOM TOOTH EXTRACTION  04/24/1987    There were no vitals filed for this visit.   Subjective Assessment - 05/11/21 1108     Subjective Patient underwent a right lumpectomy and senitnel node biopsy (5 negative nodes) on 04/20/2021. She begins chemotherapy 05/18/2021 for 12 weeks and Herceptin 9 months followed by radiation and anti-estrogen therapy.    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 She had a right lumpectomy and senitnel node biopsy (5 negative nodes) on 04/20/2021. It is ER positive, PR negative, and HER2 positive with a Ki67 of 80%.    Patient Stated Goals See if my arm is doing ok    Currently in Pain? No/denies                [facility] PT Assessment - 05/11/21 0001       Assessment   Medical Diagnosis s/p right lumpectomy and SLNB    Referring Provider (PT) Dr. Stark Klein    Onset Date/Surgical Date 04/20/21    Hand Dominance Right    Prior Therapy Baselines      Precautions   Precautions Other (comment)    Precaution Comments recent surgery and right arm lymphedema risk      Restrictions   Weight Bearing Restrictions No      Balance Screen   Has the patient fallen in the past 6 months No    Has the patient had a decrease in activity level because of a fear of falling?  No    Is the patient reluctant to leave their home because of a fear of falling?  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 She has a significant axillary cord present limiting shoulder ROM (see photo). No significant edema present.      Posture/Postural Control   Posture/Postural Control Postural limitations    Postural Limitations Rounded Shoulders;Forward head      ROM / Strength   AROM / PROM / Strength AROM      AROM   AROM Assessment Site Shoulder    Right/Left Shoulder Right     Right Shoulder Extension 70 Degrees    Right Shoulder Flexion 144 Degrees    Right Shoulder ABduction 142 Degrees    Right Shoulder Internal Rotation 73 Degrees    Right Shoulder External Rotation 85 Degrees      Strength   Overall Strength Within functional limits for tasks performed               LYMPHEDEMA/ONCOLOGY QUESTIONNAIRE - 05/11/21 0001       Type   Cancer Type Right breast cancer      Surgeries   Lumpectomy Date 04/20/21    Sentinel Lymph Node Biopsy Date 04/20/21    Number Lymph Nodes Removed 5      Treatment   Active Chemotherapy Treatment No    Past Chemotherapy Treatment No    Active Radiation Treatment No    Past Radiation Treatment No    Current Hormone Treatment No    Past Hormone Therapy No      What other symptoms do you have   Are you Having Heaviness or Tightness No    Are you having Pain No    Are you having pitting edema No    Is it Hard or Difficult finding clothes that fit No    Do you have infections No    Is there Decreased scar mobility No    [place] Sign No      Lymphedema Assessments   Lymphedema Assessments Upper extremities      Right Upper Extremity Lymphedema   10 cm Proximal to Olecranon Process 24.7 cm    Olecranon Process 22.2 cm    10 cm Proximal to Ulnar Styloid Process 18.5 cm    Just Proximal to Ulnar Styloid Process 13.3 cm    Across Hand at [employer] 17.3 cm    At [place] of 2nd Digit 5.7 cm      Left Upper Extremity Lymphedema   10 cm Proximal to Olecranon Process 23.7 cm    Olecranon Process 22.5 cm    10 cm Proximal to Ulnar Styloid Process 18.4 cm    Just Proximal to Ulnar Styloid Process 13 cm    Across Hand at [employer] 17.7 cm    At [place] of 2nd Digit 5.4 cm                Quick Dash - 05/11/21 0001     Open a tight or new jar No difficulty    Do heavy household chores (wash walls, wash floors) No difficulty    Carry a shopping bag or briefcase No difficulty  Wash your back No  difficulty    Use a knife to cut food No difficulty    Recreational activities in which you take some force or impact through your arm, shoulder, or hand (golf, hammering, tennis) Mild difficulty    During the past week, to what extent has your arm, shoulder or hand problem interfered with your normal social activities with family, friends, neighbors, or groups? Slightly    During the past week, to what extent has your arm, shoulder or hand problem limited your work or other regular daily activities Slightly    Arm, shoulder, or hand pain. Mild    Tingling (pins and needles) in your arm, shoulder, or hand Mild    Difficulty Sleeping Mild difficulty    DASH Score 13.64 %                    [facility] Adult PT Treatment/Exercise - 05/11/21 0001       Manual Therapy   Manual Therapy Myofascial release    Myofascial Release With right shoulder in ~ 120 abduction; did myofascial release to axillary cord and achieved increased ROM and reports of decreased tightness. Popping of cord noted during treatment.                     PT Education - 05/11/21 1331     Education Details Aftercare; scar massage                 PT Long Term Goals - 05/11/21 1335       PT LONG TERM GOAL #1   Title Patient will demonstrate she has regained full shoulder ROM and function post operatively compared to baselines.    Time 8    Period Weeks    Status Achieved      PT LONG TERM GOAL #2   Title Patient will report >/= 50% less c/o tightness at end ROM right shoulder.    Time 4    Period Weeks    Status New    Target Date 06/08/21      PT LONG TERM GOAL #3   Title Patient will demonstrate >/= 50% reduction in visible cording (can compare photo under media tab)    Time 4    Period Weeks    Status New    Target Date 06/08/21                   Plan - 05/11/21 1332     Clinical Impression Statement Patietn is overall doing very well s/p right lumpectomy and sentinel  node biopsy on 04/20/2021. She had 5 nodes removed (all negative) and has significant axillary cording present (see photo under media tab). She will benefit from PT to reduce cording and end ROM tightness. Right shoulder ROM is nearly full but c/o tightness at end ROM. She plans to attend the After Breast Cancer class on 06/12/2021.    PT Frequency 2x / week    PT Duration 4 weeks    PT Treatment/Interventions ADLs/Self Care Home Management;Therapeutic exercise;Patient/family education;Manual techniques;Manual lymph drainage;Passive range of motion    PT Next Visit Plan Continue manual techniques to improve cording and reduce end ROM tightness.    PT Home Exercise Plan Post op HEP and neural stretching    Consulted and Agree with Plan of Care Patient             Patient will benefit from skilled therapeutic intervention in order to improve  the following deficits and impairments:  Postural dysfunction, Decreased range of motion, Decreased knowledge of precautions, Impaired UE functional use, Pain, Increased fascial restricitons  Visit Diagnosis: Malignant neoplasm of upper-outer quadrant of right breast in female, estrogen receptor positive ([place]) - Plan: PT plan of care cert/re-cert  Abnormal posture - Plan: PT plan of care cert/re-cert  Aftercare following surgery for neoplasm - Plan: PT plan of care cert/re-cert     Problem List Patient Active Problem List   Diagnosis Date Noted   Genetic testing 04/03/2021   Family history of breast cancer 03/23/2021   Malignant neoplasm of upper-outer quadrant of right breast in female, estrogen receptor positive ([place]) 03/20/2021   Osteoporosis 12/10/2018   Susan Reid, PT 05/11/21 1:38 PM   [facility] @ [place], [place], [address] Phone: [phone]   Fax:  [phone]  Name: Susan Reid MRN: [id number] Date of Birth: 1965/05/07

## 2021-05-11 NOTE — Progress Notes (Signed)
Pharmacist Chemotherapy Monitoring - Initial Assessment    Anticipated start date: 05/18/21   The following has been reviewed per standard work regarding the patient's treatment regimen: The patient's diagnosis, treatment plan and drug doses, and organ/hematologic function Lab orders and baseline tests specific to treatment regimen  The treatment plan start date, drug sequencing, and pre-medications Prior authorization status  Patient's documented medication list, including drug-drug interaction screen and prescriptions for anti-emetics and supportive care specific to the treatment regimen The drug concentrations, fluid compatibility, administration routes, and timing of the medications to be used The patient's access for treatment and lifetime cumulative dose history, if applicable  The patient's medication allergies and previous infusion related reactions, if applicable   Changes made to treatment plan:  N/A  Follow up needed:  Pending authorization for treatment    Larene Beach, [place], 05/11/2021  2:29 PM

## 2021-05-15 ENCOUNTER — Ambulatory Visit: Payer: Managed Care, Other (non HMO)

## 2021-05-15 ENCOUNTER — Other Ambulatory Visit: Payer: Self-pay

## 2021-05-15 DIAGNOSIS — R293 Abnormal posture: Secondary | ICD-10-CM

## 2021-05-15 DIAGNOSIS — C50411 Malignant neoplasm of upper-outer quadrant of right female breast: Secondary | ICD-10-CM | POA: Diagnosis not present

## 2021-05-15 DIAGNOSIS — Z483 Aftercare following surgery for neoplasm: Secondary | ICD-10-CM

## 2021-05-15 DIAGNOSIS — Z170 Estrogen receptor positive status [ER+]: Secondary | ICD-10-CM

## 2021-05-15 NOTE — Therapy (Signed)
[facility] @ [place], [place], [address] Phone: [phone]   Fax:  [phone]  Physical Therapy Treatment  Patient Details  Name: Susan Reid MRN: [id number] Date of Birth: [birth year] Referring Provider (PT): Dr. Stark Klein   Encounter Date: 05/15/2021   PT End of Session - 05/15/21 1735     Visit Number 3    Number of Visits 10    Date for PT Re-Evaluation 06/08/21    PT Start Time 1287    PT Stop Time 1402    PT Time Calculation (min) 57 min    Activity Tolerance Patient tolerated treatment well    Behavior During Therapy [facility] for tasks assessed/performed             Past Medical History:  Diagnosis Date   Allergy    Family history of adverse reaction to anesthesia    Mother got severe N/V and pneumonia following anesthesia   Osteopenia     Past Surgical History:  Procedure Laterality Date   AXILLARY SENTINEL NODE BIOPSY Right 04/20/2021   Procedure: RIGHT AXILLARY SENTINEL NODE BIOPSY;  Surgeon: Stark Klein, MD;  Location: [facility];  Service: General;  Laterality: Right;   BREAST BIOPSY Right 02/25/2020   [place]   BREAST BIOPSY Right 03/14/2021   BREAST LUMPECTOMY WITH SENTINEL LYMPH NODE BIOPSY Right 04/20/2021   Procedure: RIGHT BREAST LUMPECTOMY;  Surgeon: Stark Klein, MD;  Location: [facility];  Service: General;  Laterality: Right;   PORTACATH PLACEMENT Left 04/20/2021   Procedure: INSERTION PORT-A-CATH;  Surgeon: Stark Klein, MD;  Location: [facility];  Service: General;  Laterality: Left;   WISDOM TOOTH EXTRACTION  04/24/1987    There were no vitals filed for this visit.   Subjective Assessment - 05/15/21 1308     Subjective I've been doing the exercises she gave me last time so I'm a little sore in my Rt axilla, but not having any pain. And I can already tell I'm able to lift my arm higher.    Pertinent History Patient was diagnosed on 03/07/2021 with right grade II-III invasive ductal  carcinoma breast cancer. She had a right lumpectomy and senitnel node biopsy (5 negative nodes) on 04/20/2021. It is ER positive, PR negative, and HER2 positive with a Ki67 of 80%.    Patient Stated Goals See if my arm is doing ok    Currently in Pain? No/denies                               [facility] Adult PT Treatment/Exercise - 05/15/21 0001       Manual Therapy   Manual Therapy Myofascial release;Manual Lymphatic Drainage (MLD);Passive ROM    Myofascial Release To Rt axilla and upper arm during P/ROM at areas of cording    Passive ROM In Supine to Rt shoulder into flexion, abduction and D2                          PT Long Term Goals - 05/11/21 1335       PT LONG TERM GOAL #1   Title Patient will demonstrate she has regained full shoulder ROM and function post operatively compared to baselines.    Time 8    Period Weeks    Status Achieved      PT LONG TERM GOAL #2   Title Patient will report >/=  50% less c/o tightness at end ROM right shoulder.    Time 4    Period Weeks    Status New    Target Date 06/08/21      PT LONG TERM GOAL #3   Title Patient will demonstrate >/= 50% reduction in visible cording (can compare photo under media tab)    Time 4    Period Weeks    Status New    Target Date 06/08/21                   Plan - 05/15/21 1735     Clinical Impression Statement First session of MFR to areas of cording at Rt axilla and into upper arm with P/ROM of Rt shoulder. Pt tolerated this very well with no c/o pain during and reports feeling looser in shoulder at cording at end of session.    Stability/Clinical Decision Making Stable/Uncomplicated    Rehab Potential Excellent    PT Frequency 2x / week    PT Duration 4 weeks    PT Treatment/Interventions ADLs/Self Care Home Management;Therapeutic exercise;Patient/family education;Manual techniques;Manual lymph drainage;Passive range of motion    PT Next Visit Plan Continue  manual techniques to improve cording and reduce end ROM tightness.    PT Home Exercise Plan Post op HEP and neural stretching    Consulted and Agree with Plan of Care Patient             Patient will benefit from skilled therapeutic intervention in order to improve the following deficits and impairments:  Postural dysfunction, Decreased range of motion, Decreased knowledge of precautions, Impaired UE functional use, Pain, Increased fascial restricitons  Visit Diagnosis: Malignant neoplasm of upper-outer quadrant of right breast in female, estrogen receptor positive ([place])  Abnormal posture  Aftercare following surgery for neoplasm     Problem List Patient Active Problem List   Diagnosis Date Noted   Genetic testing 04/03/2021   Family history of breast cancer 03/23/2021   Malignant neoplasm of upper-outer quadrant of right breast in female, estrogen receptor positive ([place]) 03/20/2021   Osteoporosis 12/10/2018    Otelia Limes, PTA 05/15/2021, 5:37 PM  [facility] @ [place], [place], [address] Phone: [phone]   Fax:  [phone]  Name: Susan Reid MRN: [id number] Date of Birth: February 15, 1966

## 2021-05-16 NOTE — Progress Notes (Signed)
The following biosimilar Kanjinti (trastuzumab-anns) has been selected for use in this patient.  Kennith Center, Pharm.D., CPP 05/16/2021@9 :15 AM

## 2021-05-17 ENCOUNTER — Other Ambulatory Visit: Payer: Self-pay

## 2021-05-17 ENCOUNTER — Ambulatory Visit: Payer: Managed Care, Other (non HMO)

## 2021-05-17 DIAGNOSIS — Z170 Estrogen receptor positive status [ER+]: Secondary | ICD-10-CM

## 2021-05-17 DIAGNOSIS — C50411 Malignant neoplasm of upper-outer quadrant of right female breast: Secondary | ICD-10-CM | POA: Diagnosis not present

## 2021-05-17 DIAGNOSIS — Z483 Aftercare following surgery for neoplasm: Secondary | ICD-10-CM

## 2021-05-17 DIAGNOSIS — R293 Abnormal posture: Secondary | ICD-10-CM

## 2021-05-17 NOTE — Assessment & Plan Note (Signed)
03/14/2021:Palpable right breast mass diagnostic mammogram: 1.6 cm mass in the right breast at 9 o'clock. 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 04/20/21: Rt Lumpectomy: Grade 3 IDC 0/5 Ln neg, Margins Neg, Her2+, Copy #5.85, ratio 3.16 ER+(60%)/PR+(0%), Ki-67 80%.  Treatment Plan: 1. adjuvant chemotherapy with Taxol Herceptin followed by Herceptin maintenance 2.Adjuvant radiation therapy 3.Adjuvant antiestrogen therapy --------------------------------------------------------------------------------------------------------------------------------------------------- Current treatment: Cycle 1 Taxol Herceptin Echocardiogram 04/05/2021: EF 59% Labs reviewed Chemo class completed chemo consent obtained. Return to clinic in 1 week for cycle 2 and toxicity check

## 2021-05-17 NOTE — Therapy (Signed)
[facility] @ [place], [place], [address] Phone: [phone]   Fax:  [phone]  Physical Therapy Treatment  Patient Details  Name: Susan Reid MRN: [id number] Date of Birth: 1966-03-21 Referring Provider (PT): Dr. Stark Klein   Encounter Date: 05/17/2021   PT End of Session - 05/17/21 1005     Visit Number 4    Number of Visits 10    Date for PT Re-Evaluation 06/08/21    PT Start Time 1003    PT Stop Time 0626    PT Time Calculation (min) 55 min    Activity Tolerance Patient tolerated treatment well    Behavior During Therapy [facility] for tasks assessed/performed             Past Medical History:  Diagnosis Date   Allergy    Family history of adverse reaction to anesthesia    Mother got severe N/V and pneumonia following anesthesia   Osteopenia     Past Surgical History:  Procedure Laterality Date   AXILLARY SENTINEL NODE BIOPSY Right 04/20/2021   Procedure: RIGHT AXILLARY SENTINEL NODE BIOPSY;  Surgeon: Stark Klein, MD;  Location: [facility];  Service: General;  Laterality: Right;   BREAST BIOPSY Right 02/25/2020   [place]   BREAST BIOPSY Right 03/14/2021   BREAST LUMPECTOMY WITH SENTINEL LYMPH NODE BIOPSY Right 04/20/2021   Procedure: RIGHT BREAST LUMPECTOMY;  Surgeon: Stark Klein, MD;  Location: [facility];  Service: General;  Laterality: Right;   PORTACATH PLACEMENT Left 04/20/2021   Procedure: INSERTION PORT-A-CATH;  Surgeon: Stark Klein, MD;  Location: [facility];  Service: General;  Laterality: Left;   WISDOM TOOTH EXTRACTION  04/24/1987    There were no vitals filed for this visit.   Subjective Assessment - 05/17/21 1004     Subjective Cording is doing better but my shoulder is still tight. Axilla is still a little sore.  I am sleeping better.    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 She had a right lumpectomy and  senitnel node biopsy (5 negative nodes) on 04/20/2021. It is ER positive, PR negative, and HER2 positive with a Ki67 of 80%.    Patient Stated Goals See if my arm is doing ok    Currently in Pain? No/denies    Multiple Pain Sites No                               [facility] Adult PT Treatment/Exercise - 05/17/21 0001       Manual Therapy   Manual Therapy Myofascial release;Passive ROM    Myofascial Release To Rt axilla and upper arm during P/ROM at areas of cording    Passive ROM In Supine to Rt shoulder into flexion, abduction and D2                          PT Long Term Goals - 05/11/21 1335       PT LONG TERM GOAL #1   Title Patient will demonstrate she has regained full shoulder ROM and function post operatively compared to baselines.    Time 8    Period Weeks    Status Achieved      PT LONG TERM GOAL #2   Title Patient will report >/= 50% less c/o tightness at end ROM right shoulder.  Time 4    Period Weeks    Status New    Target Date 06/08/21      PT LONG TERM GOAL #3   Title Patient will demonstrate >/= 50% reduction in visible cording (can compare photo under media tab)    Time 4    Period Weeks    Status New    Target Date 06/08/21                   Plan - 05/17/21 1006     Clinical Impression Statement Continued MFR to right UE cording with arm in different angles.  No pops noted today but pt did feel better after treatment and felt improved motion. TG soft cut for right arm to see if it would help with cording. Pts starts chemo tomorros    Stability/Clinical Decision Making Stable/Uncomplicated    Rehab Potential Excellent    PT Frequency 2x / week    PT Duration 4 weeks    PT Treatment/Interventions ADLs/Self Care Home Management;Therapeutic exercise;Patient/family education;Manual techniques;Manual lymph drainage;Passive range of motion    PT Next Visit Plan Continue manual techniques to improve cording and  reduce end ROM tightness.    PT Home Exercise Plan Post op HEP and neural stretching    Consulted and Agree with Plan of Care Patient             Patient will benefit from skilled therapeutic intervention in order to improve the following deficits and impairments:  Postural dysfunction, Decreased range of motion, Decreased knowledge of precautions, Impaired UE functional use, Pain, Increased fascial restricitons  Visit Diagnosis: Malignant neoplasm of upper-outer quadrant of right breast in female, estrogen receptor positive ([place])  Abnormal posture  Aftercare following surgery for neoplasm     Problem List Patient Active Problem List   Diagnosis Date Noted   Genetic testing 04/03/2021   Family history of breast cancer 03/23/2021   Malignant neoplasm of upper-outer quadrant of right breast in female, estrogen receptor positive ([place]) 03/20/2021   Osteoporosis 12/10/2018    Claris Pong, PT 05/17/2021, 12:19 PM  [facility] @ [place], [place], [address] Phone: [phone]   Fax:  [phone]  Name: Susan Reid MRN: [id number] Date of Birth: [birth year]

## 2021-05-17 NOTE — Progress Notes (Signed)
Patient Care Team: Lesleigh Noe, MD as PCP - General (Family Medicine) Druscilla Brownie, MD as Consulting Physician (Dermatology) Rockwell Germany, RN as Oncology Nurse Navigator Tressie Llera, Paulette Blanch, RN as Oncology Nurse Navigator Stark Klein, MD as Consulting Physician (General Surgery) [name], MD as Consulting Physician (Hematology and Oncology) Gery Pray, MD as Consulting Physician (Radiation Oncology)  DIAGNOSIS:    ICD-10-CM   1. Malignant neoplasm of upper-outer quadrant of right breast in female, estrogen receptor positive ([place])  C50.411    Z17.0       SUMMARY OF ONCOLOGIC HISTORY: Oncology History  Malignant neoplasm of upper-outer quadrant of right breast in female, estrogen receptor positive ([place])  03/14/2021 Initial Diagnosis   Palpable right breast mass diagnostic mammogram: 1.6 cm mass in the right breast at 9 o'clock. 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    03/22/2021 Cancer Staging   Staging form: Breast, AJCC 8th Edition - Clinical stage from 03/22/2021: Stage IA (cT1b, cN0, cM0, G3, ER+, PR-, HER2+) - Signed by [name], MD on 03/22/2021 Stage prefix: Initial diagnosis Histologic grading system: 3 grade system     Genetic Testing   [facility] CustomNext Panel was Negative. Report date is 04/03/2021.  The CustomNext-Cancer+RNAinsight panel offered by [facility] includes sequencing and rearrangement analysis for the following 47 genes:  APC, ATM, AXIN2, BARD1, BMPR1A, BRCA1, BRCA2, BRIP1, CDH1, CDK4, CDKN2A, CHEK2, CTNNA1, DICER1, EPCAM, GREM1, HOXB13, KIT, MEN1, MLH1, MSH2, MSH3, MSH6, MUTYH, NBN, NF1, NTHL1, PALB2, PDGFRA, PMS2, POLD1, POLE, PTEN, RAD50, RAD51C, RAD51D, SDHA, SDHB, SDHC, SDHD, SMAD4, SMARCA4, STK11, TP53, TSC1, TSC2, and VHL.  RNA data is routinely analyzed for use in variant interpretation for all genes.   04/20/2021 Surgery   Rt Lumpectomy: Grade 3 IDC 1.9 cm  0/5 Ln neg, Margins Neg, Her2+, Copy #5.85, ratio 3.16 ER+(60%)/PR+(0%), Ki-67 80%   05/18/2021 -  Chemotherapy   Patient is on Treatment Plan : BREAST Paclitaxel + Trastuzumab q7d / Trastuzumab q21d       CHIEF COMPLIANT: Follow-up of breast cancer  INTERVAL HISTORY: Susan Reid is a 56 y.o. with above-mentioned history of breast cancer, to start chemotherapy on Paclitaxel + Trastuzumab. She presents to the clinic today for follow-up.  She is very anxious to get started with her treatment.  She had some cording under the right arm and had to undergo physical therapy.  ALLERGIES:  is allergic to nitrates, organic.  MEDICATIONS:  Current Outpatient Medications  Medication Sig Dispense Refill   acetaminophen (TYLENOL) 325 MG tablet Take 1,000 mg by mouth every 6 (six) hours as needed.     Ascorbic Acid (VITAMIN C PO) Take 500 mg by mouth daily.     Calcium Citrate 1040 MG TABS Take 2,080 mg of elemental calcium by mouth daily.     cholecalciferol (VITAMIN D) 1000 UNITS tablet Take 1,000 Units by mouth daily.     fluticasone (FLONASE) 50 MCG/ACT nasal spray Place 1 spray into both nostrils daily as needed for allergies or rhinitis.     lidocaine-prilocaine (EMLA) cream Apply to affected area once 30 g 3   loratadine (CLARITIN) 10 MG tablet Take 10 mg by mouth daily as needed for allergies.     LORazepam (ATIVAN) 0.5 MG tablet Take 1 tablet (0.5 mg total) by mouth at bedtime as needed (Nausea or vomiting). 30 tablet 0   Multiple Vitamin (MULTIVITAMIN) tablet Take 1 tablet by mouth daily.  ondansetron (ZOFRAN) 8 MG tablet Take 1 tablet (8 mg total) by mouth 2 (two) times daily as needed (Nausea or vomiting). 30 tablet 1   oxyCODONE (OXY IR/ROXICODONE) 5 MG immediate release tablet Take 1 tablet (5 mg total) by mouth every 6 (six) hours as needed for severe pain. 5 tablet 0   prochlorperazine (COMPAZINE) 10 MG tablet Take 1 tablet (10 mg total) by mouth every 6 (six) hours as needed (Nausea  or vomiting). 30 tablet 1   No current facility-administered medications for this visit.    PHYSICAL EXAMINATION: ECOG PERFORMANCE STATUS: 1 - Symptomatic but completely ambulatory  Vitals:   05/18/21 0856  BP: 128/68  Pulse: 80  Resp: 18  Temp: 97.6 F (36.4 C)  SpO2: 99%   Filed Weights   05/18/21 0856  Weight: 124 lb 4.8 oz (56.4 kg)    BREAST: No palpable masses or nodules in either right or left breasts. No palpable axillary supraclavicular or infraclavicular adenopathy no breast tenderness or nipple discharge. (exam performed in the presence of a chaperone)  LABORATORY DATA:  I have reviewed the data as listed CMP Latest Ref Rng & Units 03/22/2021 02/02/2021 03/07/2020  Glucose 70 - 99 mg/dL 101(H) 129(H) 94  BUN 6 - 20 mg/dL _0 Creatinine 0.44 - 1.00 mg/dL 0.84 0.86 0.94  Sodium 135 - 145 mmol/L 139 139 140  Potassium 3.5 - 5.1 mmol/L 4.3 4.0 4.5  Chloride 98 - 111 mmol/L 104 102 101  CO2 22 - 32 mmol/L 28 29 33(H)  Calcium 8.9 - 10.3 mg/dL 9.6 9.6 9.8  Total Protein 6.5 - 8.1 g/dL 7.8 6.7 7.4  Total Bilirubin 0.3 - 1.2 mg/dL 0.5 0.4 0.4  Alkaline Phos 38 - 126 U/L 89 88 88  AST 15 - 41 U/L _1 ALT 0 - 44 U/L _2 Lab Results  Component Value Date   WBC 4.4 05/18/2021   HGB 12.7 05/18/2021   HCT 38.6 05/18/2021   MCV 91.3 05/18/2021   PLT 266 05/18/2021   NEUTROABS 2.4 05/18/2021    ASSESSMENT & PLAN:  Malignant neoplasm of upper-outer quadrant of right breast in female, estrogen receptor positive ([place]) 03/14/2021:Palpable right breast mass diagnostic mammogram: 1.6 cm mass in the right breast at 9 o'clock. 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  04/20/21: Rt Lumpectomy: Grade 3 IDC 0/5 Ln neg, Margins Neg, Her2+, Copy #5.85, ratio 3.16 ER+(60%)/PR+(0%), Ki-67 80%.   Treatment Plan: 1. adjuvant chemotherapy with Taxol Herceptin followed by Herceptin maintenance 2.   Adjuvant radiation therapy 3.  Adjuvant antiestrogen therapy --------------------------------------------------------------------------------------------------------------------------------------------------- Current treatment: Cycle 1 Taxol Herceptin Echocardiogram 04/05/2021: EF 59% Labs reviewed Chemo class completed chemo consent obtained. Return to clinic in 1 week for cycle 2 and toxicity check    No orders of the defined types were placed in this encounter.  The patient has a good understanding of the overall plan. she agrees with it. she will call with any problems that may develop before the next visit here.  Total time spent: 30 mins including face to face time and time spent for planning, charting and coordination of care  Rulon Eisenmenger, MD, MPH 05/18/2021  I, Thana Ates, am acting as scribe for Dr. [name].  I have reviewed the above documentation for accuracy and completeness, and I agree with the above.

## 2021-05-18 ENCOUNTER — Inpatient Hospital Stay: Payer: Managed Care, Other (non HMO) | Admitting: Hematology and Oncology

## 2021-05-18 ENCOUNTER — Inpatient Hospital Stay: Payer: Managed Care, Other (non HMO)

## 2021-05-18 ENCOUNTER — Encounter: Payer: Self-pay | Admitting: *Deleted

## 2021-05-18 VITALS — BP 116/72 | HR 87 | Temp 99.4°F | Resp 18

## 2021-05-18 DIAGNOSIS — C50411 Malignant neoplasm of upper-outer quadrant of right female breast: Secondary | ICD-10-CM | POA: Diagnosis not present

## 2021-05-18 DIAGNOSIS — Z95828 Presence of other vascular implants and grafts: Secondary | ICD-10-CM | POA: Insufficient documentation

## 2021-05-18 DIAGNOSIS — Z170 Estrogen receptor positive status [ER+]: Secondary | ICD-10-CM

## 2021-05-18 DIAGNOSIS — Z5112 Encounter for antineoplastic immunotherapy: Secondary | ICD-10-CM | POA: Diagnosis not present

## 2021-05-18 LAB — CBC WITH DIFFERENTIAL (CANCER CENTER ONLY)
Abs Immature Granulocytes: 0.01 10*3/uL (ref 0.00–0.07)
Basophils Absolute: 0 10*3/uL (ref 0.0–0.1)
Basophils Relative: 1 %
Eosinophils Absolute: 0.2 10*3/uL (ref 0.0–0.5)
Eosinophils Relative: 4 %
HCT: 38.6 % (ref 36.0–46.0)
Hemoglobin: 12.7 g/dL (ref 12.0–15.0)
Immature Granulocytes: 0 %
Lymphocytes Relative: 31 %
Lymphs Abs: 1.4 10*3/uL (ref 0.7–4.0)
MCH: 30 pg (ref 26.0–34.0)
MCHC: 32.9 g/dL (ref 30.0–36.0)
MCV: 91.3 fL (ref 80.0–100.0)
Monocytes Absolute: 0.4 10*3/uL (ref 0.1–1.0)
Monocytes Relative: 10 %
Neutro Abs: 2.4 10*3/uL (ref 1.7–7.7)
Neutrophils Relative %: 54 %
Platelet Count: 266 10*3/uL (ref 150–400)
RBC: 4.23 MIL/uL (ref 3.87–5.11)
RDW: 13.2 % (ref 11.5–15.5)
WBC Count: 4.4 10*3/uL (ref 4.0–10.5)
nRBC: 0 % (ref 0.0–0.2)

## 2021-05-18 LAB — CMP (CANCER CENTER ONLY)
ALT: 17 U/L (ref 0–44)
AST: 18 U/L (ref 15–41)
Albumin: 4.4 g/dL (ref 3.5–5.0)
Alkaline Phosphatase: 87 U/L (ref 38–126)
Anion gap: 5 (ref 5–15)
BUN: 20 mg/dL (ref 6–20)
CO2: 30 mmol/L (ref 22–32)
Calcium: 9.6 mg/dL (ref 8.9–10.3)
Chloride: 107 mmol/L (ref 98–111)
Creatinine: 0.65 mg/dL (ref 0.44–1.00)
GFR, Estimated: >60 mL/min (ref >60)
Glucose, Bld: 84 mg/dL (ref 70–99)
Potassium: 3.8 mmol/L (ref 3.5–5.1)
Sodium: 142 mmol/L (ref 135–145)
Total Bilirubin: 0.4 mg/dL (ref 0.3–1.2)
Total Protein: 6.8 g/dL (ref 6.5–8.1)

## 2021-05-18 MED ORDER — SODIUM CHLORIDE 0.9 % IV SOLN
10.0000 mg | Freq: Once | INTRAVENOUS | Status: AC
  Administered 2021-05-18: 10 mg via INTRAVENOUS
  Filled 2021-05-18: qty 10

## 2021-05-18 MED ORDER — SODIUM CHLORIDE 0.9% FLUSH
10.0000 mL | INTRAVENOUS | Status: DC | PRN
  Administered 2021-05-18: 10 mL

## 2021-05-18 MED ORDER — ACETAMINOPHEN 325 MG PO TABS
650.0000 mg | ORAL_TABLET | Freq: Once | ORAL | Status: AC
  Administered 2021-05-18: 650 mg via ORAL
  Filled 2021-05-18: qty 2

## 2021-05-18 MED ORDER — DIPHENHYDRAMINE HCL 50 MG/ML IJ SOLN
12.5000 mg | Freq: Once | INTRAMUSCULAR | Status: AC
  Administered 2021-05-18: 12.5 mg via INTRAVENOUS
  Filled 2021-05-18: qty 1

## 2021-05-18 MED ORDER — TRASTUZUMAB-ANNS CHEMO 150 MG IV SOLR
4.0000 mg/kg | Freq: Once | INTRAVENOUS | Status: AC
  Administered 2021-05-18: 231 mg via INTRAVENOUS
  Filled 2021-05-18: qty 11

## 2021-05-18 MED ORDER — SODIUM CHLORIDE 0.9 % IV SOLN
80.0000 mg/m2 | Freq: Once | INTRAVENOUS | Status: AC
  Administered 2021-05-18: 126 mg via INTRAVENOUS
  Filled 2021-05-18: qty 21

## 2021-05-18 MED ORDER — SODIUM CHLORIDE 0.9% FLUSH
10.0000 mL | Freq: Once | INTRAVENOUS | Status: AC
  Administered 2021-05-18: 10 mL

## 2021-05-18 MED ORDER — HEPARIN SOD (PORK) LOCK FLUSH 100 UNIT/ML IV SOLN
500.0000 [IU] | Freq: Once | INTRAVENOUS | Status: AC | PRN
  Administered 2021-05-18: 500 [IU]

## 2021-05-18 MED ORDER — FAMOTIDINE 20 MG IN NS 100 ML IVPB
20.0000 mg | Freq: Once | INTRAVENOUS | Status: AC
  Administered 2021-05-18: 20 mg via INTRAVENOUS
  Filled 2021-05-18: qty 100

## 2021-05-18 MED ORDER — SODIUM CHLORIDE 0.9 % IV SOLN: Freq: Once | INTRAVENOUS | Status: AC

## 2021-05-18 NOTE — Patient Instructions (Signed)
[facility]  Discharge Instructions: Thank you for choosing [facility] to provide your oncology and hematology care.   If you have a lab appointment with the [facility], please go directly to the [facility] and check in at the registration area.   Wear comfortable clothing and clothing appropriate for easy access to any Portacath or PICC line.   We strive to give you quality time with your provider. You may need to reschedule your appointment if you arrive late (15 or more minutes).  Arriving late affects you and other patients whose appointments are after yours.  Also, if you miss three or more appointments without notifying the office, you may be dismissed from the clinic at the providers discretion.      For prescription refill requests, have your pharmacy contact our office and allow 72 hours for refills to be completed.    Today you received the following chemotherapy and/or immunotherapy agents Trastuzumab & Paclitaxel      To help prevent nausea and vomiting after your treatment, we encourage you to take your nausea medication as directed.  BELOW ARE SYMPTOMS THAT SHOULD BE REPORTED IMMEDIATELY: *FEVER GREATER THAN 100.4 F (38 C) OR HIGHER *CHILLS OR SWEATING *NAUSEA AND VOMITING THAT IS NOT CONTROLLED WITH YOUR NAUSEA MEDICATION *UNUSUAL SHORTNESS OF BREATH *UNUSUAL BRUISING OR BLEEDING *URINARY PROBLEMS (pain or burning when urinating, or frequent urination) *BOWEL PROBLEMS (unusual diarrhea, constipation, pain near the anus) TENDERNESS IN MOUTH AND THROAT WITH OR WITHOUT PRESENCE OF ULCERS (sore throat, sores in mouth, or a toothache) UNUSUAL RASH, SWELLING OR PAIN  UNUSUAL VAGINAL DISCHARGE OR ITCHING   Items with * indicate a potential emergency and should be followed up as soon as possible or go to the Emergency Department if any problems should occur.  Please show the CHEMOTHERAPY ALERT CARD or IMMUNOTHERAPY ALERT CARD  at check-in to the Emergency Department and triage nurse.  Should you have questions after your visit or need to cancel or reschedule your appointment, please contact [facility]  Dept: [phone]  and follow the prompts.  Office hours are 8:00 a.m. to 4:30 p.m. Monday - Friday. Please note that voicemails left after 4:00 p.m. may not be returned until the following business day.  We are closed weekends and major holidays. You have access to a nurse at all times for urgent questions. Please call the main number to the clinic Dept: [phone] and follow the prompts.   For any non-urgent questions, you may also contact your provider using MyChart. We now offer e-Visits for anyone 10 and older to request care online for non-urgent symptoms. For details visit [email or web address].   Also download the MyChart app! Go to the app store, search "MyChart", open the app, select , and log in with your MyChart username and password.  Due to Covid, a mask is required upon entering the hospital/clinic. If you do not have a mask, one will be given to you upon arrival. For doctor visits, patients may have 1 support person aged 72 or older with them. For treatment visits, patients cannot have anyone with them due to current Covid guidelines and our immunocompromised population.   Trastuzumab injection for infusion What is this medication? TRASTUZUMAB (tras TOO zoo mab) is a monoclonal antibody. It is used to treat breast cancer and stomach cancer. This medicine may be used for other purposes; ask your health care provider or pharmacist if you have questions. COMMON  BRAND NAME(S): Herceptin, Galvin Proffer, Trazimera What should I tell my care team before I take this medication? They need to know if you have any of these conditions: heart disease heart failure lung or breathing disease, like asthma an unusual or allergic reaction to  trastuzumab, benzyl alcohol, or other medications, foods, dyes, or preservatives pregnant or trying to get pregnant breast-feeding How should I use this medication? This drug is given as an infusion into a vein. It is administered in a hospital or clinic by a specially trained health care professional. Talk to your pediatrician regarding the use of this medicine in children. This medicine is not approved for use in children. Overdosage: If you think you have taken too much of this medicine contact a poison control center or emergency room at once. NOTE: This medicine is only for you. Do not share this medicine with others. What if I miss a dose? It is important not to miss a dose. Call your doctor or health care professional if you are unable to keep an appointment. What may interact with this medication? This medicine may interact with the following medications: certain types of chemotherapy, such as daunorubicin, doxorubicin, epirubicin, and idarubicin This list may not describe all possible interactions. Give your health care provider a list of all the medicines, herbs, non-prescription drugs, or dietary supplements you use. Also tell them if you smoke, drink alcohol, or use illegal drugs. Some items may interact with your medicine. What should I watch for while using this medication? Visit your doctor for checks on your progress. Report any side effects. Continue your course of treatment even though you feel ill unless your doctor tells you to stop. Call your doctor or health care professional for advice if you get a fever, chills or sore throat, or other symptoms of a cold or flu. Do not treat yourself. Try to avoid being around people who are sick. You may experience fever, chills and shaking during your first infusion. These effects are usually mild and can be treated with other medicines. Report any side effects during the infusion to your health care professional. Fever and chills usually  do not happen with later infusions. Do not become pregnant while taking this medicine or for 7 months after stopping it. Women should inform their doctor if they wish to become pregnant or think they might be pregnant. Women of child-bearing potential will need to have a negative pregnancy test before starting this medicine. There is a potential for serious side effects to an unborn child. Talk to your health care professional or pharmacist for more information. Do not breast-feed an infant while taking this medicine or for 7 months after stopping it. Women must use effective birth control with this medicine. What side effects may I notice from receiving this medication? Side effects that you should report to your doctor or health care professional as soon as possible: allergic reactions like skin rash, itching or hives, swelling of the face, lips, or tongue chest pain or palpitations cough dizziness feeling faint or lightheaded, falls fever general ill feeling or flu-like symptoms signs of worsening heart failure like breathing problems; swelling in your legs and feet unusually weak or tired Side effects that usually do not require medical attention (report to your doctor or health care professional if they continue or are bothersome): bone pain changes in taste diarrhea joint pain nausea/vomiting weight loss This list may not describe all possible side effects. Call your doctor for medical advice about  side effects. You may report side effects to FDA at 1-800-FDA-1088. Where should I keep my medication? This drug is given in a hospital or clinic and will not be stored at home. NOTE: This sheet is a summary. It may not cover all possible information. If you have questions about this medicine, talk to your doctor, pharmacist, or health care provider.  2022 Elsevier/Gold Standard (2016-04-24 00:00:00)  Paclitaxel injection What is this medication? PACLITAXEL (PAK li TAX el) is a  chemotherapy drug. It targets fast dividing cells, like cancer cells, and causes these cells to die. This medicine is used to treat ovarian cancer, breast cancer, lung cancer, Kaposi's sarcoma, and other cancers. This medicine may be used for other purposes; ask your health care provider or pharmacist if you have questions. COMMON BRAND NAME(S): Onxol, Taxol What should I tell my care team before I take this medication? They need to know if you have any of these conditions: history of irregular heartbeat liver disease low blood counts, like low white cell, platelet, or red cell counts lung or breathing disease, like asthma tingling of the fingers or toes, or other nerve disorder an unusual or allergic reaction to paclitaxel, alcohol, polyoxyethylated castor oil, other chemotherapy, other medicines, foods, dyes, or preservatives pregnant or trying to get pregnant breast-feeding How should I use this medication? This drug is given as an infusion into a vein. It is administered in a hospital or clinic by a specially trained health care professional. Talk to your pediatrician regarding the use of this medicine in children. Special care may be needed. Overdosage: If you think you have taken too much of this medicine contact a poison control center or emergency room at once. NOTE: This medicine is only for you. Do not share this medicine with others. What if I miss a dose? It is important not to miss your dose. Call your doctor or health care professional if you are unable to keep an appointment. What may interact with this medication? Do not take this medicine with any of the following medications: live virus vaccines This medicine may also interact with the following medications: antiviral medicines for hepatitis, HIV or AIDS certain antibiotics like erythromycin and clarithromycin certain medicines for fungal infections like ketoconazole and itraconazole certain medicines for seizures like  carbamazepine, phenobarbital, phenytoin gemfibrozil nefazodone rifampin St. John's wort This list may not describe all possible interactions. Give your health care provider a list of all the medicines, herbs, non-prescription drugs, or dietary supplements you use. Also tell them if you smoke, drink alcohol, or use illegal drugs. Some items may interact with your medicine. What should I watch for while using this medication? Your condition will be monitored carefully while you are receiving this medicine. You will need important blood work done while you are taking this medicine. This medicine can cause serious allergic reactions. To reduce your risk you will need to take other medicine(s) before treatment with this medicine. If you experience allergic reactions like skin rash, itching or hives, swelling of the face, lips, or tongue, tell your doctor or health care professional right away. In some cases, you may be given additional medicines to help with side effects. Follow all directions for their use. This drug may make you feel generally unwell. This is not uncommon, as chemotherapy can affect healthy cells as well as cancer cells. Report any side effects. Continue your course of treatment even though you feel ill unless your doctor tells you to stop. Call your doctor or  health care professional for advice if you get a fever, chills or sore throat, or other symptoms of a cold or flu. Do not treat yourself. This drug decreases your body's ability to fight infections. Try to avoid being around people who are sick. This medicine may increase your risk to bruise or bleed. Call your doctor or health care professional if you notice any unusual bleeding. Be careful brushing and flossing your teeth or using a toothpick because you may get an infection or bleed more easily. If you have any dental work done, tell your dentist you are receiving this medicine. Avoid taking products that contain aspirin,  acetaminophen, ibuprofen, naproxen, or ketoprofen unless instructed by your doctor. These medicines may hide a fever. Do not become pregnant while taking this medicine. Women should inform their doctor if they wish to become pregnant or think they might be pregnant. There is a potential for serious side effects to an unborn child. Talk to your health care professional or pharmacist for more information. Do not breast-feed an infant while taking this medicine. Men are advised not to father a child while receiving this medicine. This product may contain alcohol. Ask your pharmacist or healthcare provider if this medicine contains alcohol. Be sure to tell all healthcare providers you are taking this medicine. Certain medicines, like metronidazole and disulfiram, can cause an unpleasant reaction when taken with alcohol. The reaction includes flushing, headache, nausea, vomiting, sweating, and increased thirst. The reaction can last from 30 minutes to several hours. What side effects may I notice from receiving this medication? Side effects that you should report to your doctor or health care professional as soon as possible: allergic reactions like skin rash, itching or hives, swelling of the face, lips, or tongue breathing problems changes in vision fast, irregular heartbeat high or low blood pressure mouth sores pain, tingling, numbness in the hands or feet signs of decreased platelets or bleeding - bruising, pinpoint red spots on the skin, black, tarry stools, blood in the urine signs of decreased red blood cells - unusually weak or tired, feeling faint or lightheaded, falls signs of infection - fever or chills, cough, sore throat, pain or difficulty passing urine signs and symptoms of liver injury like dark yellow or brown urine; general ill feeling or flu-like symptoms; light-colored stools; loss of appetite; nausea; right upper belly pain; unusually weak or tired; yellowing of the eyes or  skin swelling of the ankles, feet, hands unusually slow heartbeat Side effects that usually do not require medical attention (report to your doctor or health care professional if they continue or are bothersome): diarrhea hair loss loss of appetite muscle or joint pain nausea, vomiting pain, redness, or irritation at site where injected tiredness This list may not describe all possible side effects. Call your doctor for medical advice about side effects. You may report side effects to FDA at 1-800-FDA-1088. Where should I keep my medication? This drug is given in a hospital or clinic and will not be stored at home. NOTE: This sheet is a summary. It may not cover all possible information. If you have questions about this medicine, talk to your doctor, pharmacist, or health care provider.  2022 Elsevier/Gold Standard (2020-12-27 00:00:00)

## 2021-05-19 ENCOUNTER — Telehealth: Payer: Self-pay | Admitting: *Deleted

## 2021-05-19 ENCOUNTER — Telehealth: Payer: Self-pay | Admitting: Hematology and Oncology

## 2021-05-19 NOTE — Telephone Encounter (Signed)
Called & left message for pt to return call to let us know how she did with her treatment.

## 2021-05-19 NOTE — Telephone Encounter (Signed)
Scheduled appointment per 1/26 los. Patient is aware of updated appointment times.

## 2021-05-19 NOTE — Telephone Encounter (Signed)
-----   Message from Rolene Course, RN sent at 05/18/2021  3:41 PM EST ----- Regarding: [name] 1st Tx F/U call - trastuzumab, paclitaxel [name] 1st Tx F/U call - trastuzumab, paclitaxel.  Patient tolerated well.

## 2021-05-22 ENCOUNTER — Encounter: Payer: Self-pay | Admitting: Hematology and Oncology

## 2021-05-22 NOTE — Telephone Encounter (Signed)
Received voice mail message at 1635 in PT. ED voicemail on Friday 1-27-21fowarded from Dr. Geralyn Flash nurse. Pt called back sting that she was returning Myrtle's call. Ms Southers stated that she is doing well. Eating and drinking well. No N/V.

## 2021-05-23 ENCOUNTER — Encounter: Payer: Self-pay | Admitting: Rehabilitation

## 2021-05-23 ENCOUNTER — Other Ambulatory Visit: Payer: Self-pay

## 2021-05-23 ENCOUNTER — Ambulatory Visit: Payer: Managed Care, Other (non HMO) | Admitting: Rehabilitation

## 2021-05-23 DIAGNOSIS — Z483 Aftercare following surgery for neoplasm: Secondary | ICD-10-CM

## 2021-05-23 DIAGNOSIS — M79601 Pain in right arm: Secondary | ICD-10-CM

## 2021-05-23 DIAGNOSIS — Z170 Estrogen receptor positive status [ER+]: Secondary | ICD-10-CM

## 2021-05-23 DIAGNOSIS — C50411 Malignant neoplasm of upper-outer quadrant of right female breast: Secondary | ICD-10-CM

## 2021-05-23 NOTE — Therapy (Signed)
[facility] @ [address] [place], [place], [address] Phone: [phone]   Fax:  [phone]  Physical Therapy Treatment  Patient Details  Name: Susan Reid MRN: [id number] Date of Birth: October 14, 1965 Referring Provider (PT): Dr. Stark Klein   Encounter Date: 05/23/2021   PT End of Session - 05/23/21 1457     Visit Number 5    Number of Visits 10    Date for PT Re-Evaluation 06/08/21    PT Start Time 5638    PT Stop Time 1546    PT Time Calculation (min) 47 min    Activity Tolerance Patient tolerated treatment well    Behavior During Therapy [facility] for tasks assessed/performed             Past Medical History:  Diagnosis Date   Allergy    Family history of adverse reaction to anesthesia    Mother got severe N/V and pneumonia following anesthesia   Osteopenia     Past Surgical History:  Procedure Laterality Date   AXILLARY SENTINEL NODE BIOPSY Right 04/20/2021   Procedure: RIGHT AXILLARY SENTINEL NODE BIOPSY;  Surgeon: Stark Klein, MD;  Location: [facility];  Service: General;  Laterality: Right;   BREAST BIOPSY Right 02/25/2020   [place]   BREAST BIOPSY Right 03/14/2021   BREAST LUMPECTOMY WITH SENTINEL LYMPH NODE BIOPSY Right 04/20/2021   Procedure: RIGHT BREAST LUMPECTOMY;  Surgeon: Stark Klein, MD;  Location: [facility];  Service: General;  Laterality: Right;   PORTACATH PLACEMENT Left 04/20/2021   Procedure: INSERTION PORT-A-CATH;  Surgeon: Stark Klein, MD;  Location: [place];  Service: General;  Laterality: Left;   WISDOM TOOTH EXTRACTION  04/24/1987    There were no vitals filed for this visit.   Subjective Assessment - 05/23/21 1457     Subjective I feel wonderful from getting steroids from the chemotherapy.  I did fine after chemotherapy.    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 She had a right lumpectomy and senitnel node  biopsy (5 negative nodes) on 04/20/2021. It is ER positive, PR negative, and HER2 positive with a Ki67 of 80%.    Currently in Pain? No/denies                [facility] PT Assessment - 05/23/21 0001       AROM   Right Shoulder Flexion 160 Degrees   pull upper arm   Right Shoulder ABduction 170 Degrees    Right Shoulder Horizontal ABduction 60 Degrees                           [facility] Adult PT Treatment/Exercise - 05/23/21 0001       Exercises   Exercises Shoulder      Shoulder Exercises: Pulleys   Flexion 2 minutes    Flexion Limitations with intial cueing    ABduction 2 minutes    ABduction Limitations with intial cueing    Other Pulley Exercises pt reports she has pulleys set up already at home due to husband using them      Shoulder Exercises: Stretch   Wall Stretch - ABduction 1 rep;20 seconds    Wall Stretch - ABduction Limitations education on wall stretch more in a Y position now that the cording is loosening up      Manual Therapy   Myofascial Release To Rt axilla and upper arm during  P/ROM at areas of cording    Passive ROM In Supine to Rt shoulder into flexion, abduction and D2 - including MWM to axilla/cords for release                          PT Long Term Goals - 05/23/21 1658       PT LONG TERM GOAL #1   Title Patient will demonstrate she has regained full shoulder ROM and function post operatively compared to baselines.    Status Achieved      PT LONG TERM GOAL #2   Title Patient will report >/= 50% less c/o tightness at end ROM right shoulder.    Status Achieved      PT LONG TERM GOAL #3   Title Patient will demonstrate >/= 50% reduction in visible cording (can compare photo under media tab)    Status Achieved      PT LONG TERM GOAL #4   Title Pt will be ind with final HEP for continued stretching    Time 3    Period Weeks    Status New                   Plan - 05/23/21 1656     Clinical Impression  Statement Pt has had improvements in pain and ROM noted after receiving her first chemotherapy infusion, possibly steroid related? Will return to baseline AROM and decrease in cording severity and tension.   Large cord looks more like 3-4 small cords at this time.  Cord still reaches to about mid upper arm most prominent in Y position.    PT Frequency 2x / week    PT Duration 4 weeks    PT Treatment/Interventions ADLs/Self Care Home Management;Therapeutic exercise;Patient/family education;Manual techniques;Manual lymph drainage;Passive range of motion    PT Next Visit Plan Continue manual techniques to improve cording and reduce end ROM tightness.    Consulted and Agree with Plan of Care Patient             Patient will benefit from skilled therapeutic intervention in order to improve the following deficits and impairments:     Visit Diagnosis: Malignant neoplasm of upper-outer quadrant of right breast in female, estrogen receptor positive [facility])  Aftercare following surgery for neoplasm  Pain in right arm     Problem List Patient Active Problem List   Diagnosis Date Noted   Port-A-Cath in place 05/18/2021   Genetic testing 04/03/2021   Family history of breast cancer 03/23/2021   Malignant neoplasm of upper-outer quadrant of right breast in female, estrogen receptor positive ([place]) 03/20/2021   Osteoporosis 12/10/2018    Stark Bray, PT 05/23/2021, 5:00 PM  [facility] @ [address] [place], [place], [address] Phone: [phone]   Fax:  [phone]  Name: Susan Reid MRN: [id number] Date of Birth: 30-May-1965

## 2021-05-25 ENCOUNTER — Encounter: Payer: Self-pay | Admitting: *Deleted

## 2021-05-25 MED FILL — Dexamethasone Sodium Phosphate Inj 100 MG/10ML: INTRAMUSCULAR | Qty: 1 | Status: AC

## 2021-05-25 NOTE — Progress Notes (Signed)
Patient Care Team: Lesleigh Noe, MD as PCP - General (Family Medicine) Druscilla Brownie, MD as Consulting Physician (Dermatology) Rockwell Germany, RN as Oncology Nurse Navigator Tressie Emigh, Paulette Blanch, RN as Oncology Nurse Navigator Stark Klein, MD as Consulting Physician (General Surgery) [name], MD as Consulting Physician (Hematology and Oncology) Gery Pray, MD as Consulting Physician (Radiation Oncology)  DIAGNOSIS:    ICD-10-CM   1. Malignant neoplasm of upper-outer quadrant of right breast in female, estrogen receptor positive ([place])  C50.411    Z17.0       SUMMARY OF ONCOLOGIC HISTORY: Oncology History  Malignant neoplasm of upper-outer quadrant of right breast in female, estrogen receptor positive ([place])  03/14/2021 Initial Diagnosis   Palpable right breast mass diagnostic mammogram: 1.6 cm mass in the right breast at 9 o'clock. 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    03/22/2021 Cancer Staging   Staging form: Breast, AJCC 8th Edition - Clinical stage from 03/22/2021: Stage IA (cT1b, cN0, cM0, G3, ER+, PR-, HER2+) - Signed by [name], MD on 03/22/2021 Stage prefix: Initial diagnosis Histologic grading system: 3 grade system     Genetic Testing   [facility] CustomNext Panel was Negative. Report date is 04/03/2021.  The CustomNext-Cancer+RNAinsight panel offered by [facility] includes sequencing and rearrangement analysis for the following 47 genes:  APC, ATM, AXIN2, BARD1, BMPR1A, BRCA1, BRCA2, BRIP1, CDH1, CDK4, CDKN2A, CHEK2, CTNNA1, DICER1, EPCAM, GREM1, HOXB13, KIT, MEN1, MLH1, MSH2, MSH3, MSH6, MUTYH, NBN, NF1, NTHL1, PALB2, PDGFRA, PMS2, POLD1, POLE, PTEN, RAD50, RAD51C, RAD51D, SDHA, SDHB, SDHC, SDHD, SMAD4, SMARCA4, STK11, TP53, TSC1, TSC2, and VHL.  RNA data is routinely analyzed for use in variant interpretation for all genes.   04/20/2021 Surgery   Rt Lumpectomy: Grade 3 IDC 1.9 cm  0/5 Ln neg, Margins Neg, Her2+, Copy #5.85, ratio 3.16 ER+(60%)/PR+(0%), Ki-67 80%   05/18/2021 -  Chemotherapy   Patient is on Treatment Plan : BREAST Paclitaxel + Trastuzumab q7d / Trastuzumab q21d       CHIEF COMPLIANT: Cycle 2 Taxol and Herceptin   INTERVAL HISTORY: Susan Reid is a 56 y.o. with above-mentioned history of breast cancer, to start chemotherapy on Paclitaxel + Trastuzumab. She presents to the clinic today for follow-up and treatment.  Tolerated cycle 1 extremely well.  Denies any nausea or vomiting.  Denies any neuropathy.  She had a slight infusion reaction with Taxol that lasted for a few seconds and then the chest pain went away.  ALLERGIES:  is allergic to nitrates, organic.  MEDICATIONS:  Current Outpatient Medications  Medication Sig Dispense Refill   acetaminophen (TYLENOL) 325 MG tablet Take 1,000 mg by mouth every 6 (six) hours as needed.     Ascorbic Acid (VITAMIN C PO) Take 500 mg by mouth daily.     Calcium Citrate 1040 MG TABS Take 2,080 mg of elemental calcium by mouth daily.     cholecalciferol (VITAMIN D) 1000 UNITS tablet Take 1,000 Units by mouth daily.     fluticasone (FLONASE) 50 MCG/ACT nasal spray Place 1 spray into both nostrils daily as needed for allergies or rhinitis.     lidocaine-prilocaine (EMLA) cream Apply to affected area once 30 g 3   loratadine (CLARITIN) 10 MG tablet Take 10 mg by mouth daily as needed for allergies.     LORazepam (ATIVAN) 0.5 MG tablet Take 1 tablet (0.5 mg total) by mouth at bedtime as needed (Nausea or vomiting). 30 tablet 0  Multiple Vitamin (MULTIVITAMIN) tablet Take 1 tablet by mouth daily.     ondansetron (ZOFRAN) 8 MG tablet Take 1 tablet (8 mg total) by mouth 2 (two) times daily as needed (Nausea or vomiting). 30 tablet 1   prochlorperazine (COMPAZINE) 10 MG tablet Take 1 tablet (10 mg total) by mouth every 6 (six) hours as needed (Nausea or vomiting). 30 tablet 1   No current facility-administered  medications for this visit.    PHYSICAL EXAMINATION: ECOG PERFORMANCE STATUS: 1 - Symptomatic but completely ambulatory  Vitals:   05/26/21 0958  BP: 134/73  Pulse: 79  Resp: 16  Temp: 98.2 F (36.8 C)  SpO2: 100%   Filed Weights   05/26/21 0958  Weight: 123 lb 11.2 oz (56.1 kg)    LABORATORY DATA:  I have reviewed the data as listed CMP Latest Ref Rng & Units 05/18/2021 03/22/2021 02/02/2021  Glucose 70 - 99 mg/dL 84 101(H) 129(H)  BUN 6 - 20 mg/dL _0 Creatinine 0.44 - 1.00 mg/dL 0.65 0.84 0.86  Sodium 135 - 145 mmol/L 142 139 139  Potassium 3.5 - 5.1 mmol/L 3.8 4.3 4.0  Chloride 98 - 111 mmol/L 107 104 102  CO2 22 - 32 mmol/L _1 Calcium 8.9 - 10.3 mg/dL 9.6 9.6 9.6  Total Protein 6.5 - 8.1 g/dL 6.8 7.8 6.7  Total Bilirubin 0.3 - 1.2 mg/dL 0.4 0.5 0.4  Alkaline Phos 38 - 126 U/L 87 89 88  AST 15 - 41 U/L _2 ALT 0 - 44 U/L _3 Lab Results  Component Value Date   WBC 4.4 05/26/2021   HGB 12.6 05/26/2021   HCT 37.9 05/26/2021   MCV 91.1 05/26/2021   PLT 274 05/26/2021   NEUTROABS 2.6 05/26/2021    ASSESSMENT & PLAN:  Malignant neoplasm of upper-outer quadrant of right breast in female, estrogen receptor positive ([place]) 03/14/2021:Palpable right breast mass diagnostic mammogram: 1.6 cm mass in the right breast at 9 o'clock. 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  04/20/21: Rt Lumpectomy: Grade 3 IDC 0/5 Ln neg, Margins Neg, Her2+, Copy #5.85, ratio 3.16 ER+(60%)/PR+(0%), Ki-67 80%.   Treatment Plan: 1. adjuvant chemotherapy with Taxol Herceptin followed by Herceptin maintenance 2.  Adjuvant radiation therapy 3.  Adjuvant antiestrogen therapy --------------------------------------------------------------------------------------------------------------------------------------------------- Current treatment: Cycle 2 Taxol Herceptin Echocardiogram 04/05/2021: EF 59% Labs  reviewed Chemo toxicities: Slight infusion reaction: Lasted for less than a few seconds where she had some chest pain and pelvic pain. lack of sleep: She will try melatonin over-the-counter.  Denies any nausea or vomiting or neuropathy or any other side effects from treatment.  She has excellent energy levels. Return to clinic in weekly for chemo     No orders of the defined types were placed in this encounter.  The patient has a good understanding of the overall plan. she agrees with it. she will call with any problems that may develop before the next visit here.  Total time spent: 30 mins including face to face time and time spent for planning, charting and coordination of care  Rulon Eisenmenger, MD, MPH 05/26/2021  I, Thana Ates, am acting as scribe for Dr. [name].  I have reviewed the above documentation for accuracy and completeness, and I agree with the above.

## 2021-05-25 NOTE — Assessment & Plan Note (Signed)
03/14/2021:Palpable right breast mass diagnostic mammogram: 1.6 cm mass in the right breast at 9 o'clock. 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 04/20/21: Rt Lumpectomy: Grade 3 IDC 0/5 Ln neg, Margins Neg,Her2+, Copy #5.85, ratio 3.16 ER+(60%)/PR+(0%), Ki-67 80%.  Treatment Plan: 1.adjuvant chemotherapy with Taxol Herceptin followed by Herceptin maintenance 2.Adjuvant radiation therapy 3.Adjuvant antiestrogen therapy --------------------------------------------------------------------------------------------------------------------------------------------------- Current treatment: Cycle 2 Taxol Herceptin Echocardiogram 04/05/2021: EF 59% Labs reviewed Chemo toxicities:  Return to clinic in weekly for chemo and every other week for follow-up with me.

## 2021-05-26 ENCOUNTER — Inpatient Hospital Stay: Payer: Managed Care, Other (non HMO)

## 2021-05-26 ENCOUNTER — Inpatient Hospital Stay: Payer: Managed Care, Other (non HMO) | Admitting: Hematology and Oncology

## 2021-05-26 ENCOUNTER — Other Ambulatory Visit: Payer: Self-pay

## 2021-05-26 ENCOUNTER — Inpatient Hospital Stay: Payer: Managed Care, Other (non HMO) | Attending: Hematology and Oncology

## 2021-05-26 DIAGNOSIS — Z170 Estrogen receptor positive status [ER+]: Secondary | ICD-10-CM | POA: Insufficient documentation

## 2021-05-26 DIAGNOSIS — C50411 Malignant neoplasm of upper-outer quadrant of right female breast: Secondary | ICD-10-CM

## 2021-05-26 DIAGNOSIS — Z5112 Encounter for antineoplastic immunotherapy: Secondary | ICD-10-CM | POA: Diagnosis not present

## 2021-05-26 DIAGNOSIS — G4700 Insomnia, unspecified: Secondary | ICD-10-CM | POA: Insufficient documentation

## 2021-05-26 DIAGNOSIS — Z5111 Encounter for antineoplastic chemotherapy: Secondary | ICD-10-CM | POA: Diagnosis present

## 2021-05-26 DIAGNOSIS — D701 Agranulocytosis secondary to cancer chemotherapy: Secondary | ICD-10-CM | POA: Diagnosis not present

## 2021-05-26 DIAGNOSIS — Z95828 Presence of other vascular implants and grafts: Secondary | ICD-10-CM

## 2021-05-26 LAB — CMP (CANCER CENTER ONLY)
ALT: 32 U/L (ref 0–44)
AST: 20 U/L (ref 15–41)
Albumin: 4.3 g/dL (ref 3.5–5.0)
Alkaline Phosphatase: 92 U/L (ref 38–126)
Anion gap: 6 (ref 5–15)
BUN: 17 mg/dL (ref 6–20)
CO2: 30 mmol/L (ref 22–32)
Calcium: 9.7 mg/dL (ref 8.9–10.3)
Chloride: 106 mmol/L (ref 98–111)
Creatinine: 0.74 mg/dL (ref 0.44–1.00)
GFR, Estimated: >60 mL/min (ref >60)
Glucose, Bld: 76 mg/dL (ref 70–99)
Potassium: 4.1 mmol/L (ref 3.5–5.1)
Sodium: 142 mmol/L (ref 135–145)
Total Bilirubin: 0.4 mg/dL (ref 0.3–1.2)
Total Protein: 6.9 g/dL (ref 6.5–8.1)

## 2021-05-26 LAB — CBC WITH DIFFERENTIAL (CANCER CENTER ONLY)
Abs Immature Granulocytes: 0.02 10*3/uL (ref 0.00–0.07)
Basophils Absolute: 0 10*3/uL (ref 0.0–0.1)
Basophils Relative: 1 %
Eosinophils Absolute: 0.1 10*3/uL (ref 0.0–0.5)
Eosinophils Relative: 3 %
HCT: 37.9 % (ref 36.0–46.0)
Hemoglobin: 12.6 g/dL (ref 12.0–15.0)
Immature Granulocytes: 1 %
Lymphocytes Relative: 29 %
Lymphs Abs: 1.3 10*3/uL (ref 0.7–4.0)
MCH: 30.3 pg (ref 26.0–34.0)
MCHC: 33.2 g/dL (ref 30.0–36.0)
MCV: 91.1 fL (ref 80.0–100.0)
Monocytes Absolute: 0.3 10*3/uL (ref 0.1–1.0)
Monocytes Relative: 8 %
Neutro Abs: 2.6 10*3/uL (ref 1.7–7.7)
Neutrophils Relative %: 58 %
Platelet Count: 274 10*3/uL (ref 150–400)
RBC: 4.16 MIL/uL (ref 3.87–5.11)
RDW: 13 % (ref 11.5–15.5)
WBC Count: 4.4 10*3/uL (ref 4.0–10.5)
nRBC: 0 % (ref 0.0–0.2)

## 2021-05-26 MED ORDER — DIPHENHYDRAMINE HCL 50 MG/ML IJ SOLN
12.5000 mg | Freq: Once | INTRAMUSCULAR | Status: AC
  Administered 2021-05-26: 12.5 mg via INTRAVENOUS
  Filled 2021-05-26: qty 1

## 2021-05-26 MED ORDER — SODIUM CHLORIDE 0.9 % IV SOLN: Freq: Once | INTRAVENOUS | Status: AC

## 2021-05-26 MED ORDER — TRASTUZUMAB-ANNS CHEMO 150 MG IV SOLR
2.0000 mg/kg | Freq: Once | INTRAVENOUS | Status: AC
  Administered 2021-05-26: 105 mg via INTRAVENOUS
  Filled 2021-05-26: qty 5

## 2021-05-26 MED ORDER — SODIUM CHLORIDE 0.9% FLUSH
10.0000 mL | Freq: Once | INTRAVENOUS | Status: AC
  Administered 2021-05-26: 10 mL

## 2021-05-26 MED ORDER — SODIUM CHLORIDE 0.9 % IV SOLN
10.0000 mg | Freq: Once | INTRAVENOUS | Status: AC
  Administered 2021-05-26: 10 mg via INTRAVENOUS
  Filled 2021-05-26: qty 10

## 2021-05-26 MED ORDER — SODIUM CHLORIDE 0.9 % IV SOLN
80.0000 mg/m2 | Freq: Once | INTRAVENOUS | Status: AC
  Administered 2021-05-26: 126 mg via INTRAVENOUS
  Filled 2021-05-26: qty 21

## 2021-05-26 MED ORDER — ACETAMINOPHEN 325 MG PO TABS
650.0000 mg | ORAL_TABLET | Freq: Once | ORAL | Status: AC
  Administered 2021-05-26: 650 mg via ORAL
  Filled 2021-05-26: qty 2

## 2021-05-26 MED ORDER — HEPARIN SOD (PORK) LOCK FLUSH 100 UNIT/ML IV SOLN
500.0000 [IU] | Freq: Once | INTRAVENOUS | Status: AC | PRN
  Administered 2021-05-26: 500 [IU]

## 2021-05-26 MED ORDER — FAMOTIDINE IN NACL 20-0.9 MG/50ML-% IV SOLN
20.0000 mg | Freq: Once | INTRAVENOUS | Status: AC
  Administered 2021-05-26: 20 mg via INTRAVENOUS
  Filled 2021-05-26: qty 50

## 2021-05-26 MED ORDER — SODIUM CHLORIDE 0.9% FLUSH
10.0000 mL | INTRAVENOUS | Status: DC | PRN
  Administered 2021-05-26: 10 mL

## 2021-05-30 ENCOUNTER — Encounter: Payer: Self-pay | Admitting: Physical Therapy

## 2021-05-30 ENCOUNTER — Ambulatory Visit: Payer: Managed Care, Other (non HMO) | Attending: General Surgery | Admitting: Physical Therapy

## 2021-05-30 ENCOUNTER — Other Ambulatory Visit: Payer: Self-pay

## 2021-05-30 DIAGNOSIS — M79601 Pain in right arm: Secondary | ICD-10-CM | POA: Insufficient documentation

## 2021-05-30 DIAGNOSIS — Z170 Estrogen receptor positive status [ER+]: Secondary | ICD-10-CM | POA: Diagnosis present

## 2021-05-30 DIAGNOSIS — C50411 Malignant neoplasm of upper-outer quadrant of right female breast: Secondary | ICD-10-CM | POA: Diagnosis present

## 2021-05-30 DIAGNOSIS — Z483 Aftercare following surgery for neoplasm: Secondary | ICD-10-CM | POA: Diagnosis present

## 2021-05-30 DIAGNOSIS — R293 Abnormal posture: Secondary | ICD-10-CM | POA: Insufficient documentation

## 2021-05-30 NOTE — Therapy (Signed)
[facility] @ [address] [place], [place], [address] Phone: [phone]   Fax:  [phone]  Physical Therapy Treatment  Patient Details  Name: Susan Reid MRN: [id number] Date of Birth: 31-Dec-1965 Referring Provider (PT): Dr. Stark Klein   Encounter Date: 05/30/2021   PT End of Session - 05/30/21 1358     Visit Number 6    Number of Visits 10    Date for PT Re-Evaluation 06/08/21    PT Start Time 2761    PT Stop Time 8485    PT Time Calculation (min) 49 min    Activity Tolerance Patient tolerated treatment well    Behavior During Therapy [facility] for tasks assessed/performed             Past Medical History:  Diagnosis Date   Allergy    Family history of adverse reaction to anesthesia    Mother got severe N/V and pneumonia following anesthesia   Osteopenia     Past Surgical History:  Procedure Laterality Date   AXILLARY SENTINEL NODE BIOPSY Right 04/20/2021   Procedure: RIGHT AXILLARY SENTINEL NODE BIOPSY;  Surgeon: Stark Klein, MD;  Location: [facility];  Service: General;  Laterality: Right;   BREAST BIOPSY Right 02/25/2020   [place]   BREAST BIOPSY Right 03/14/2021   BREAST LUMPECTOMY WITH SENTINEL LYMPH NODE BIOPSY Right 04/20/2021   Procedure: RIGHT BREAST LUMPECTOMY;  Surgeon: Stark Klein, MD;  Location: [facility];  Service: General;  Laterality: Right;   PORTACATH PLACEMENT Left 04/20/2021   Procedure: INSERTION PORT-A-CATH;  Surgeon: Stark Klein, MD;  Location: [facility];  Service: General;  Laterality: Left;   WISDOM TOOTH EXTRACTION  04/24/1987    There were no vitals filed for this visit.   Subjective Assessment - 05/30/21 1308     Subjective The cording is getting better. I think the steroids helped.    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 She had a right lumpectomy and senitnel node biopsy (5 negative nodes) on 04/20/2021.  It is ER positive, PR negative, and HER2 positive with a Ki67 of 80%.    Patient Stated Goals See if my arm is doing ok    Currently in Pain? No/denies    Pain Score 0-No pain                               [facility] Adult PT Treatment/Exercise - 05/30/21 0001       Shoulder Exercises: Supine   Horizontal ABduction Strengthening;Both;10 reps;Theraband   pt returned therapist demo   Theraband Level (Shoulder Horizontal ABduction) Level 1 (Yellow)    External Rotation Strengthening;Both;10 reps;Theraband   pt returned therapist demo   Theraband Level (Shoulder External Rotation) Level 1 (Yellow)    Flexion Strengthening;Both;10 reps;Theraband   narrow and wide grip, pt returned therapist demo   Theraband Level (Shoulder Flexion) Level 1 (Yellow)    Diagonals Strengthening;Both;10 reps;Theraband   pt returned therapist demo   Theraband Level (Shoulder Diagonals) Level 1 (Yellow)      Shoulder Exercises: Pulleys   Flexion 2 minutes    ABduction 2 minutes      Manual Therapy   Myofascial Release To Rt axilla and upper arm during P/ROM at areas of cording    Passive ROM In Supine to Rt shoulder into flexion, abduction  PT Long Term Goals - 05/23/21 1658       PT LONG TERM GOAL #1   Title Patient will demonstrate she has regained full shoulder ROM and function post operatively compared to baselines.    Status Achieved      PT LONG TERM GOAL #2   Title Patient will report >/= 50% less c/o tightness at end ROM right shoulder.    Status Achieved      PT LONG TERM GOAL #3   Title Patient will demonstrate >/= 50% reduction in visible cording (can compare photo under media tab)    Status Achieved      PT LONG TERM GOAL #4   Title Pt will be ind with final HEP for continued stretching    Time 3    Period Weeks    Status New                   Plan - 05/30/21 1359     Clinical Impression Statement Continued with  AAROM exercises followed by PROM. Pt feels her cording is improving. There are 4 smaller cords that are palpable in R axilla that became less palpable following myofascial release today. Instructed pt in supine scapular strengthening exercises and issued these as part of pt's HEP.    PT Frequency 2x / week    PT Duration 4 weeks    PT Treatment/Interventions ADLs/Self Care Home Management;Therapeutic exercise;Patient/family education;Manual techniques;Manual lymph drainage;Passive range of motion    PT Next Visit Plan possibly place pt on hold for a couple of weeks and see how she does with independent management, assess indep with supine scap and give red band, Continue manual techniques to improve cording and reduce end ROM tightness.    PT Home Exercise Plan Post op HEP and neural stretching, supine scap series    Consulted and Agree with Plan of Care Patient             Patient will benefit from skilled therapeutic intervention in order to improve the following deficits and impairments:  Postural dysfunction, Decreased range of motion, Decreased knowledge of precautions, Impaired UE functional use, Pain, Increased fascial restricitons  Visit Diagnosis: Pain in right arm  Aftercare following surgery for neoplasm  Abnormal posture  Malignant neoplasm of upper-outer quadrant of right breast in female, estrogen receptor positive ([place])     Problem List Patient Active Problem List   Diagnosis Date Noted   Port-A-Cath in place 05/18/2021   Genetic testing 04/03/2021   Family history of breast cancer 03/23/2021   Malignant neoplasm of upper-outer quadrant of right breast in female, estrogen receptor positive ([place]) 03/20/2021   Osteoporosis 12/10/2018    Manus Gunning, PT 05/30/2021, 2:01 PM  [facility] @ [address] [place], [place], [address] Phone: [phone]   Fax:  [phone]  Name: DELISHA PEADEN MRN:  [id number] Date of Birth: 31-Dec-1965

## 2021-05-30 NOTE — Patient Instructions (Signed)
Over Head Pull: Narrow and Wide Grip   Cancer Rehab [phone]   On back, knees bent, feet flat, band across thighs, elbows straight but relaxed. Pull hands apart (start). Keeping elbows straight, bring arms up and over head, hands toward floor. Keep pull steady on band. Hold momentarily. Return slowly, keeping pull steady, back to start. Then do same with a wider grip on the band (past shoulder width) Repeat _10__ times. Band color __yellow____   Side Pull: Double Arm   On back, knees bent, feet flat. Arms perpendicular to body, shoulder level, elbows straight but relaxed. Pull arms out to sides, elbows straight. Resistance band comes across collarbones, hands toward floor. Hold momentarily. Slowly return to starting position. Repeat _10__ times. Band color _yellow____   Sword   On back, knees bent, feet flat, left hand on left hip, right hand above left. Pull right arm DIAGONALLY (hip to shoulder) across chest. Bring right arm along head toward floor. Thumb down when by hip and rotates upwards when by head. Hold momentarily. Slowly return to starting position. Repeat _10__ times. Do with left arm. Band color _yellow_____   Shoulder Rotation: Double Arm   On back, knees bent, feet flat, elbows tucked at sides, bent 90, hands palms up. Pull hands apart and down toward floor, keeping elbows near sides. Hold momentarily. Slowly return to starting position. Repeat _10__ times. Band color __yellow____

## 2021-06-01 ENCOUNTER — Ambulatory Visit: Payer: Managed Care, Other (non HMO)

## 2021-06-01 ENCOUNTER — Other Ambulatory Visit: Payer: Self-pay

## 2021-06-01 DIAGNOSIS — Z483 Aftercare following surgery for neoplasm: Secondary | ICD-10-CM

## 2021-06-01 DIAGNOSIS — C50411 Malignant neoplasm of upper-outer quadrant of right female breast: Secondary | ICD-10-CM

## 2021-06-01 DIAGNOSIS — R293 Abnormal posture: Secondary | ICD-10-CM

## 2021-06-01 DIAGNOSIS — M79601 Pain in right arm: Secondary | ICD-10-CM

## 2021-06-01 DIAGNOSIS — Z170 Estrogen receptor positive status [ER+]: Secondary | ICD-10-CM

## 2021-06-01 MED FILL — Dexamethasone Sodium Phosphate Inj 100 MG/10ML: INTRAMUSCULAR | Qty: 1 | Status: AC

## 2021-06-01 NOTE — Patient Instructions (Signed)
Access Code: [email or web address] URL: [email or web address] Date: 06/01/2021 Prepared by: Cheral Almas  Exercises Scapular Retraction with Resistance - 1 x daily - 73 x weekly - 1 sets - 10 reps Scapular Retraction with Resistance Advanced - 1 x daily - 3 x weekly - 1 sets - 10 reps

## 2021-06-01 NOTE — Therapy (Signed)
[facility] @ [place], [place], [address] Phone: [phone]   Fax:  [phone]  Physical Therapy Treatment  Patient Details  Name: Susan Reid MRN: [id number] Date of Birth: 25-Feb-1966 Referring Provider (PT): Dr. Stark Klein   Encounter Date: 06/01/2021   PT End of Session - 06/01/21 1132     Visit Number 7    Number of Visits 11    Date for PT Re-Evaluation 06/29/21    PT Start Time 1101    PT Stop Time 3361    PT Time Calculation (min) 53 min    Activity Tolerance Patient tolerated treatment well    Behavior During Therapy [facility] for tasks assessed/performed             Past Medical History:  Diagnosis Date   Allergy    Family history of adverse reaction to anesthesia    Mother got severe N/V and pneumonia following anesthesia   Osteopenia     Past Surgical History:  Procedure Laterality Date   AXILLARY SENTINEL NODE BIOPSY Right 04/20/2021   Procedure: RIGHT AXILLARY SENTINEL NODE BIOPSY;  Surgeon: Stark Klein, MD;  Location: [facility];  Service: General;  Laterality: Right;   BREAST BIOPSY Right 02/25/2020   [place]   BREAST BIOPSY Right 03/14/2021   BREAST LUMPECTOMY WITH SENTINEL LYMPH NODE BIOPSY Right 04/20/2021   Procedure: RIGHT BREAST LUMPECTOMY;  Surgeon: Stark Klein, MD;  Location: [facility];  Service: General;  Laterality: Right;   PORTACATH PLACEMENT Left 04/20/2021   Procedure: INSERTION PORT-A-CATH;  Surgeon: Stark Klein, MD;  Location: [facility];  Service: General;  Laterality: Left;   WISDOM TOOTH EXTRACTION  04/24/1987    There were no vitals filed for this visit.   Subjective Assessment - 06/01/21 1103     Subjective Doing well and cording is still better. No complaints of pain. It feels tight in the am but improves with exercise. I feel like I can do everything;raking yard, digging weeds.    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 She had a right lumpectomy and senitnel node biopsy (5 negative nodes) on 04/20/2021. It is ER positive, PR negative, and HER2 positive with a Ki67 of 80%.                [facility] PT Assessment - 06/01/21 0001       AROM   Right Shoulder Extension 70 Degrees    Right Shoulder Flexion 159 Degrees    Right Shoulder ABduction 170 Degrees    Right Shoulder Internal Rotation 70 Degrees    Right Shoulder External Rotation 100 Degrees                           [facility] Adult PT Treatment/Exercise - 06/01/21 0001       Shoulder Exercises: Supine   Horizontal ABduction Strengthening;Both;12 reps    Theraband Level (Shoulder Horizontal ABduction) Level 1 (Yellow)    External Rotation Strengthening;Both;12 reps    Theraband Level (Shoulder External Rotation) Level 1 (Yellow)    Flexion Strengthening;Both;12 reps    Theraband Level (Shoulder Flexion) Level 1 (Yellow)    Diagonals Strengthening;Right;Left;10 reps    Theraband Level (Shoulder Diagonals) Level 1 (Yellow)      Shoulder Exercises: Standing   Extension Strengthening;Both;10 reps    Theraband Level (Shoulder Extension) Level 1 (Yellow)    Retraction Strengthening;Both;10  reps    Theraband Level (Shoulder Retraction) Level 1 (Yellow)      Manual Therapy   Myofascial Release To Rt axilla and upper arm during P/ROM at areas of cording    Passive ROM In Supine to Rt shoulder into flexion, abduction                          PT Long Term Goals - 06/01/21 1109       PT LONG TERM GOAL #1   Title Patient will demonstrate she has regained full shoulder ROM and function post operatively compared to baselines.    Time 8    Period Weeks    Status Achieved    Target Date 05/17/21      PT LONG TERM GOAL #2   Title Patient will report >/= 50% less c/o tightness at end ROM right shoulder.    Time 4    Period Weeks    Status Achieved    Target Date 06/01/21      PT  LONG TERM GOAL #3   Title Patient will demonstrate >/= 50% reduction in visible cording (can compare photo under media tab)    Time 4    Period Weeks    Status Achieved    Target Date 06/01/21      PT LONG TERM GOAL #4   Title Pt will be ind with final HEP for continued stretching    Time 3    Period Weeks    Status New    Target Date 06/15/21                   Plan - 06/01/21 1134     Clinical Impression Statement Pt is making good progress towards goals.  Cording is still very evident, but is not interfering with her motion or daily activities.  Measured ROM of shoulder, and assessed goals. performed MFR techniques to right UE, and PROM. Gave pt a red theraband to progress to. Instructed in standing Scap retraction and shoulder extension  to add to HEP starting with yellow band.Pt. will progress reps before increasing to red band..  Discussed compression sleeve and gave pt a script to purchase one. Pt will come 1 x every week or 2 and will DC when ready    Stability/Clinical Decision Making Stable/Uncomplicated    Rehab Potential Excellent    PT Frequency 1x / week    PT Duration 4 weeks    PT Treatment/Interventions ADLs/Self Care Home Management;Therapeutic exercise;Patient/family education;Manual techniques;Manual lymph drainage;Passive range of motion    PT Next Visit Plan pt to come 1 x in 2 weeks for DC if ready or may continue 1x/week Will call if she feels she needs to come in before then, check cording, continue MFR, review exs    PT Home Exercise Plan Post op HEP and neural stretching, supine scap series    Consulted and Agree with Plan of Care Patient             Patient will benefit from skilled therapeutic intervention in order to improve the following deficits and impairments:  Postural dysfunction, Decreased range of motion, Decreased knowledge of precautions, Impaired UE functional use, Pain, Increased fascial restricitons  Visit Diagnosis: Pain in  right arm  Aftercare following surgery for neoplasm  Abnormal posture  Malignant neoplasm of upper-outer quadrant of right breast in female, estrogen receptor positive [facility])     Problem List Patient Active Problem List  Diagnosis Date Noted   Port-A-Cath in place 05/18/2021   Genetic testing 04/03/2021   Family history of breast cancer 03/23/2021   Malignant neoplasm of upper-outer quadrant of right breast in female, estrogen receptor positive ([place]) 03/20/2021   Osteoporosis 12/10/2018    Claris Pong, PT 06/01/2021, 11:58 AM  [facility] @ [place], [place], [address] Phone: [phone]   Fax:  [phone]  Name: Susan Reid MRN: [id number] Date of Birth: 05-Oct-1965

## 2021-06-02 ENCOUNTER — Inpatient Hospital Stay: Payer: Managed Care, Other (non HMO)

## 2021-06-02 VITALS — BP 121/77 | HR 70 | Temp 98.6°F | Resp 16 | Wt 125.0 lb

## 2021-06-02 DIAGNOSIS — C50411 Malignant neoplasm of upper-outer quadrant of right female breast: Secondary | ICD-10-CM

## 2021-06-02 DIAGNOSIS — Z170 Estrogen receptor positive status [ER+]: Secondary | ICD-10-CM

## 2021-06-02 DIAGNOSIS — Z5112 Encounter for antineoplastic immunotherapy: Secondary | ICD-10-CM | POA: Diagnosis not present

## 2021-06-02 DIAGNOSIS — Z95828 Presence of other vascular implants and grafts: Secondary | ICD-10-CM

## 2021-06-02 LAB — CMP (CANCER CENTER ONLY)
ALT: 25 U/L (ref 0–44)
AST: 19 U/L (ref 15–41)
Albumin: 4.3 g/dL (ref 3.5–5.0)
Alkaline Phosphatase: 85 U/L (ref 38–126)
Anion gap: 5 (ref 5–15)
BUN: 21 mg/dL — ABNORMAL HIGH (ref 6–20)
CO2: 30 mmol/L (ref 22–32)
Calcium: 9.5 mg/dL (ref 8.9–10.3)
Chloride: 106 mmol/L (ref 98–111)
Creatinine: 0.66 mg/dL (ref 0.44–1.00)
GFR, Estimated: >60 mL/min (ref >60)
Glucose, Bld: 81 mg/dL (ref 70–99)
Potassium: 4.2 mmol/L (ref 3.5–5.1)
Sodium: 141 mmol/L (ref 135–145)
Total Bilirubin: 0.4 mg/dL (ref 0.3–1.2)
Total Protein: 6.7 g/dL (ref 6.5–8.1)

## 2021-06-02 LAB — CBC WITH DIFFERENTIAL (CANCER CENTER ONLY)
Abs Immature Granulocytes: 0.01 10*3/uL (ref 0.00–0.07)
Basophils Absolute: 0 10*3/uL (ref 0.0–0.1)
Basophils Relative: 1 %
Eosinophils Absolute: 0.1 10*3/uL (ref 0.0–0.5)
Eosinophils Relative: 2 %
HCT: 37.9 % (ref 36.0–46.0)
Hemoglobin: 12.3 g/dL (ref 12.0–15.0)
Immature Granulocytes: 0 %
Lymphocytes Relative: 46 %
Lymphs Abs: 1.5 10*3/uL (ref 0.7–4.0)
MCH: 30.1 pg (ref 26.0–34.0)
MCHC: 32.5 g/dL (ref 30.0–36.0)
MCV: 92.7 fL (ref 80.0–100.0)
Monocytes Absolute: 0.3 10*3/uL (ref 0.1–1.0)
Monocytes Relative: 8 %
Neutro Abs: 1.4 10*3/uL — ABNORMAL LOW (ref 1.7–7.7)
Neutrophils Relative %: 43 %
Platelet Count: 249 10*3/uL (ref 150–400)
RBC: 4.09 MIL/uL (ref 3.87–5.11)
RDW: 13.2 % (ref 11.5–15.5)
WBC Count: 3.3 10*3/uL — ABNORMAL LOW (ref 4.0–10.5)
nRBC: 0 % (ref 0.0–0.2)

## 2021-06-02 MED ORDER — SODIUM CHLORIDE 0.9% FLUSH
10.0000 mL | Freq: Once | INTRAVENOUS | Status: AC
  Administered 2021-06-02: 10 mL

## 2021-06-02 MED ORDER — SODIUM CHLORIDE 0.9 % IV SOLN
80.0000 mg/m2 | Freq: Once | INTRAVENOUS | Status: AC
  Administered 2021-06-02: 126 mg via INTRAVENOUS
  Filled 2021-06-02: qty 21

## 2021-06-02 MED ORDER — FAMOTIDINE IN NACL 20-0.9 MG/50ML-% IV SOLN
20.0000 mg | Freq: Once | INTRAVENOUS | Status: AC
  Administered 2021-06-02: 20 mg via INTRAVENOUS
  Filled 2021-06-02: qty 50

## 2021-06-02 MED ORDER — HEPARIN SOD (PORK) LOCK FLUSH 100 UNIT/ML IV SOLN
500.0000 [IU] | Freq: Once | INTRAVENOUS | Status: AC | PRN
  Administered 2021-06-02: 500 [IU]

## 2021-06-02 MED ORDER — TRASTUZUMAB-ANNS CHEMO 150 MG IV SOLR
2.0000 mg/kg | Freq: Once | INTRAVENOUS | Status: AC
  Administered 2021-06-02: 105 mg via INTRAVENOUS
  Filled 2021-06-02: qty 5

## 2021-06-02 MED ORDER — SODIUM CHLORIDE 0.9% FLUSH
10.0000 mL | INTRAVENOUS | Status: DC | PRN
  Administered 2021-06-02: 10 mL

## 2021-06-02 MED ORDER — DIPHENHYDRAMINE HCL 50 MG/ML IJ SOLN
12.5000 mg | Freq: Once | INTRAMUSCULAR | Status: AC
  Administered 2021-06-02: 12.5 mg via INTRAVENOUS
  Filled 2021-06-02: qty 1

## 2021-06-02 MED ORDER — ACETAMINOPHEN 325 MG PO TABS
650.0000 mg | ORAL_TABLET | Freq: Once | ORAL | Status: AC
  Administered 2021-06-02: 650 mg via ORAL
  Filled 2021-06-02: qty 2

## 2021-06-02 MED ORDER — SODIUM CHLORIDE 0.9 % IV SOLN
10.0000 mg | Freq: Once | INTRAVENOUS | Status: AC
  Administered 2021-06-02: 10 mg via INTRAVENOUS
  Filled 2021-06-02: qty 10

## 2021-06-02 MED ORDER — SODIUM CHLORIDE 0.9 % IV SOLN: Freq: Once | INTRAVENOUS | Status: AC

## 2021-06-02 NOTE — Progress Notes (Signed)
Ok to treat with ANC of 1.4k/uL per Dr. Lindi Adie.

## 2021-06-02 NOTE — Patient Instructions (Signed)
[facility]  Discharge Instructions: Thank you for choosing [facility] to provide your oncology and hematology care.   If you have a lab appointment with the [facility], please go directly to the [facility] and check in at the registration area.   Wear comfortable clothing and clothing appropriate for easy access to any Portacath or PICC line.   We strive to give you quality time with your provider. You may need to reschedule your appointment if you arrive late (15 or more minutes).  Arriving late affects you and other patients whose appointments are after yours.  Also, if you miss three or more appointments without notifying the office, you may be dismissed from the clinic at the providers discretion.      For prescription refill requests, have your pharmacy contact our office and allow 72 hours for refills to be completed.    Today you received the following chemotherapy and/or immunotherapy agents Trastuzumab & Paclitaxel      To help prevent nausea and vomiting after your treatment, we encourage you to take your nausea medication as directed.  BELOW ARE SYMPTOMS THAT SHOULD BE REPORTED IMMEDIATELY: *FEVER GREATER THAN 100.4 F (38 C) OR HIGHER *CHILLS OR SWEATING *NAUSEA AND VOMITING THAT IS NOT CONTROLLED WITH YOUR NAUSEA MEDICATION *UNUSUAL SHORTNESS OF BREATH *UNUSUAL BRUISING OR BLEEDING *URINARY PROBLEMS (pain or burning when urinating, or frequent urination) *BOWEL PROBLEMS (unusual diarrhea, constipation, pain near the anus) TENDERNESS IN MOUTH AND THROAT WITH OR WITHOUT PRESENCE OF ULCERS (sore throat, sores in mouth, or a toothache) UNUSUAL RASH, SWELLING OR PAIN  UNUSUAL VAGINAL DISCHARGE OR ITCHING   Items with * indicate a potential emergency and should be followed up as soon as possible or go to the Emergency Department if any problems should occur.  Please show the CHEMOTHERAPY ALERT CARD or IMMUNOTHERAPY ALERT CARD  at check-in to the Emergency Department and triage nurse.  Should you have questions after your visit or need to cancel or reschedule your appointment, please contact [facility]  Dept: [phone]  and follow the prompts.  Office hours are 8:00 a.m. to 4:30 p.m. Monday - Friday. Please note that voicemails left after 4:00 p.m. may not be returned until the following business day.  We are closed weekends and major holidays. You have access to a nurse at all times for urgent questions. Please call the main number to the clinic Dept: [phone] and follow the prompts.   For any non-urgent questions, you may also contact your provider using MyChart. We now offer e-Visits for anyone 65 and older to request care online for non-urgent symptoms. For details visit [email or web address].   Also download the MyChart app! Go to the app store, search "MyChart", open the app, select [facility], and log in with your MyChart username and password.  Due to Covid, a mask is required upon entering the hospital/clinic. If you do not have a mask, one will be given to you upon arrival. For doctor visits, patients may have 1 support person aged 60 or older with them. For treatment visits, patients cannot have anyone with them due to current Covid guidelines and our immunocompromised population.   Trastuzumab injection for infusion What is this medication? TRASTUZUMAB (tras TOO zoo mab) is a monoclonal antibody. It is used to treat breast cancer and stomach cancer. This medicine may be used for other purposes; ask your health care provider or pharmacist if you have questions. COMMON  BRAND NAME(S): Herceptin, Galvin Proffer, Trazimera What should I tell my care team before I take this medication? They need to know if you have any of these conditions: heart disease heart failure lung or breathing disease, like asthma an unusual or allergic reaction to  trastuzumab, benzyl alcohol, or other medications, foods, dyes, or preservatives pregnant or trying to get pregnant breast-feeding How should I use this medication? This drug is given as an infusion into a vein. It is administered in a hospital or clinic by a specially trained health care professional. Talk to your pediatrician regarding the use of this medicine in children. This medicine is not approved for use in children. Overdosage: If you think you have taken too much of this medicine contact a poison control center or emergency room at once. NOTE: This medicine is only for you. Do not share this medicine with others. What if I miss a dose? It is important not to miss a dose. Call your doctor or health care professional if you are unable to keep an appointment. What may interact with this medication? This medicine may interact with the following medications: certain types of chemotherapy, such as daunorubicin, doxorubicin, epirubicin, and idarubicin This list may not describe all possible interactions. Give your health care provider a list of all the medicines, herbs, non-prescription drugs, or dietary supplements you use. Also tell them if you smoke, drink alcohol, or use illegal drugs. Some items may interact with your medicine. What should I watch for while using this medication? Visit your doctor for checks on your progress. Report any side effects. Continue your course of treatment even though you feel ill unless your doctor tells you to stop. Call your doctor or health care professional for advice if you get a fever, chills or sore throat, or other symptoms of a cold or flu. Do not treat yourself. Try to avoid being around people who are sick. You may experience fever, chills and shaking during your first infusion. These effects are usually mild and can be treated with other medicines. Report any side effects during the infusion to your health care professional. Fever and chills usually  do not happen with later infusions. Do not become pregnant while taking this medicine or for 7 months after stopping it. Women should inform their doctor if they wish to become pregnant or think they might be pregnant. Women of child-bearing potential will need to have a negative pregnancy test before starting this medicine. There is a potential for serious side effects to an unborn child. Talk to your health care professional or pharmacist for more information. Do not breast-feed an infant while taking this medicine or for 7 months after stopping it. Women must use effective birth control with this medicine. What side effects may I notice from receiving this medication? Side effects that you should report to your doctor or health care professional as soon as possible: allergic reactions like skin rash, itching or hives, swelling of the face, lips, or tongue chest pain or palpitations cough dizziness feeling faint or lightheaded, falls fever general ill feeling or flu-like symptoms signs of worsening heart failure like breathing problems; swelling in your legs and feet unusually weak or tired Side effects that usually do not require medical attention (report to your doctor or health care professional if they continue or are bothersome): bone pain changes in taste diarrhea joint pain nausea/vomiting weight loss This list may not describe all possible side effects. Call your doctor for medical advice about  side effects. You may report side effects to FDA at 1-800-FDA-1088. Where should I keep my medication? This drug is given in a hospital or clinic and will not be stored at home. NOTE: This sheet is a summary. It may not cover all possible information. If you have questions about this medicine, talk to your doctor, pharmacist, or health care provider.  2022 Elsevier/Gold Standard (2016-04-24 00:00:00)  Paclitaxel injection What is this medication? PACLITAXEL (PAK li TAX el) is a  chemotherapy drug. It targets fast dividing cells, like cancer cells, and causes these cells to die. This medicine is used to treat ovarian cancer, breast cancer, lung cancer, Kaposi's sarcoma, and other cancers. This medicine may be used for other purposes; ask your health care provider or pharmacist if you have questions. COMMON BRAND NAME(S): Onxol, Taxol What should I tell my care team before I take this medication? They need to know if you have any of these conditions: history of irregular heartbeat liver disease low blood counts, like low white cell, platelet, or red cell counts lung or breathing disease, like asthma tingling of the fingers or toes, or other nerve disorder an unusual or allergic reaction to paclitaxel, alcohol, polyoxyethylated castor oil, other chemotherapy, other medicines, foods, dyes, or preservatives pregnant or trying to get pregnant breast-feeding How should I use this medication? This drug is given as an infusion into a vein. It is administered in a hospital or clinic by a specially trained health care professional. Talk to your pediatrician regarding the use of this medicine in children. Special care may be needed. Overdosage: If you think you have taken too much of this medicine contact a poison control center or emergency room at once. NOTE: This medicine is only for you. Do not share this medicine with others. What if I miss a dose? It is important not to miss your dose. Call your doctor or health care professional if you are unable to keep an appointment. What may interact with this medication? Do not take this medicine with any of the following medications: live virus vaccines This medicine may also interact with the following medications: antiviral medicines for hepatitis, HIV or AIDS certain antibiotics like erythromycin and clarithromycin certain medicines for fungal infections like ketoconazole and itraconazole certain medicines for seizures like  carbamazepine, phenobarbital, phenytoin gemfibrozil nefazodone rifampin St. John's wort This list may not describe all possible interactions. Give your health care provider a list of all the medicines, herbs, non-prescription drugs, or dietary supplements you use. Also tell them if you smoke, drink alcohol, or use illegal drugs. Some items may interact with your medicine. What should I watch for while using this medication? Your condition will be monitored carefully while you are receiving this medicine. You will need important blood work done while you are taking this medicine. This medicine can cause serious allergic reactions. To reduce your risk you will need to take other medicine(s) before treatment with this medicine. If you experience allergic reactions like skin rash, itching or hives, swelling of the face, lips, or tongue, tell your doctor or health care professional right away. In some cases, you may be given additional medicines to help with side effects. Follow all directions for their use. This drug may make you feel generally unwell. This is not uncommon, as chemotherapy can affect healthy cells as well as cancer cells. Report any side effects. Continue your course of treatment even though you feel ill unless your doctor tells you to stop. Call your doctor or  health care professional for advice if you get a fever, chills or sore throat, or other symptoms of a cold or flu. Do not treat yourself. This drug decreases your body's ability to fight infections. Try to avoid being around people who are sick. This medicine may increase your risk to bruise or bleed. Call your doctor or health care professional if you notice any unusual bleeding. Be careful brushing and flossing your teeth or using a toothpick because you may get an infection or bleed more easily. If you have any dental work done, tell your dentist you are receiving this medicine. Avoid taking products that contain aspirin,  acetaminophen, ibuprofen, naproxen, or ketoprofen unless instructed by your doctor. These medicines may hide a fever. Do not become pregnant while taking this medicine. Women should inform their doctor if they wish to become pregnant or think they might be pregnant. There is a potential for serious side effects to an unborn child. Talk to your health care professional or pharmacist for more information. Do not breast-feed an infant while taking this medicine. Men are advised not to father a child while receiving this medicine. This product may contain alcohol. Ask your pharmacist or healthcare provider if this medicine contains alcohol. Be sure to tell all healthcare providers you are taking this medicine. Certain medicines, like metronidazole and disulfiram, can cause an unpleasant reaction when taken with alcohol. The reaction includes flushing, headache, nausea, vomiting, sweating, and increased thirst. The reaction can last from 30 minutes to several hours. What side effects may I notice from receiving this medication? Side effects that you should report to your doctor or health care professional as soon as possible: allergic reactions like skin rash, itching or hives, swelling of the face, lips, or tongue breathing problems changes in vision fast, irregular heartbeat high or low blood pressure mouth sores pain, tingling, numbness in the hands or feet signs of decreased platelets or bleeding - bruising, pinpoint red spots on the skin, black, tarry stools, blood in the urine signs of decreased red blood cells - unusually weak or tired, feeling faint or lightheaded, falls signs of infection - fever or chills, cough, sore throat, pain or difficulty passing urine signs and symptoms of liver injury like dark yellow or brown urine; general ill feeling or flu-like symptoms; light-colored stools; loss of appetite; nausea; right upper belly pain; unusually weak or tired; yellowing of the eyes or  skin swelling of the ankles, feet, hands unusually slow heartbeat Side effects that usually do not require medical attention (report to your doctor or health care professional if they continue or are bothersome): diarrhea hair loss loss of appetite muscle or joint pain nausea, vomiting pain, redness, or irritation at site where injected tiredness This list may not describe all possible side effects. Call your doctor for medical advice about side effects. You may report side effects to FDA at 1-800-FDA-1088. Where should I keep my medication? This drug is given in a hospital or clinic and will not be stored at home. NOTE: This sheet is a summary. It may not cover all possible information. If you have questions about this medicine, talk to your doctor, pharmacist, or health care provider.  2022 Elsevier/Gold Standard (2020-12-27 00:00:00)

## 2021-06-08 ENCOUNTER — Other Ambulatory Visit: Payer: Self-pay

## 2021-06-08 ENCOUNTER — Ambulatory Visit: Payer: Managed Care, Other (non HMO)

## 2021-06-08 ENCOUNTER — Encounter: Payer: Self-pay | Admitting: Family Medicine

## 2021-06-08 ENCOUNTER — Encounter: Payer: Self-pay | Admitting: Physical Therapy

## 2021-06-08 DIAGNOSIS — C50411 Malignant neoplasm of upper-outer quadrant of right female breast: Secondary | ICD-10-CM

## 2021-06-08 DIAGNOSIS — Z170 Estrogen receptor positive status [ER+]: Secondary | ICD-10-CM

## 2021-06-08 DIAGNOSIS — M79601 Pain in right arm: Secondary | ICD-10-CM | POA: Diagnosis not present

## 2021-06-08 DIAGNOSIS — Z483 Aftercare following surgery for neoplasm: Secondary | ICD-10-CM

## 2021-06-08 DIAGNOSIS — R293 Abnormal posture: Secondary | ICD-10-CM

## 2021-06-08 MED FILL — Dexamethasone Sodium Phosphate Inj 100 MG/10ML: INTRAMUSCULAR | Qty: 1 | Status: AC

## 2021-06-08 NOTE — Therapy (Signed)
[facility] °[address] °[place], [place], [address] °Phone: [phone]   Fax:  [phone] ° °Physical Therapy Treatment ° °Patient Details  °Name: Susan Reid °MRN: [id number] °Date of Birth: 12/16/1965 °Referring Provider (PT): Dr. Faera Byerly ° ° °Encounter Date: 06/08/2021 ° ° PT End of Session - 06/08/21 1105   ° ° Visit Number 8   ° Number of Visits 11   ° Date for PT Re-Evaluation 06/29/21   ° PT Start Time 1103   ° PT Stop Time 1149   ° PT Time Calculation (min) 46 min   ° Activity Tolerance Patient tolerated treatment well   ° Behavior During Therapy WFL for tasks assessed/performed   ° °  °  ° °  ° ° °Past Medical History:  °Diagnosis Date  ° Allergy   ° Family history of adverse reaction to anesthesia   ° Mother got severe N/V and pneumonia following anesthesia  ° Osteopenia   ° ° °Past Surgical History:  °Procedure Laterality Date  ° AXILLARY SENTINEL NODE BIOPSY Right 04/20/2021  ° Procedure: RIGHT AXILLARY SENTINEL NODE BIOPSY;  Surgeon: Byerly, Faera, MD;  Location: [facility] OR;  Service: General;  Laterality: Right;  ° BREAST BIOPSY Right 02/25/2020  ° PASH  ° BREAST BIOPSY Right 03/14/2021  ° BREAST LUMPECTOMY WITH SENTINEL LYMPH NODE BIOPSY Right 04/20/2021  ° Procedure: RIGHT BREAST LUMPECTOMY;  Surgeon: Byerly, Faera, MD;  Location: [facility] OR;  Service: General;  Laterality: Right;  ° PORTACATH PLACEMENT Left 04/20/2021  ° Procedure: INSERTION PORT-A-CATH;  Surgeon: Byerly, Faera, MD;  Location: [facility] OR;  Service: General;  Laterality: Left;  ° WISDOM TOOTH EXTRACTION  04/24/1987  ° ° °There were no vitals filed for this visit. ° ° Subjective Assessment - 06/08/21 1103   ° ° Subjective I am doing OK. The knot at my arm pit came back again  I had been doing alot of house cleaning.I got fit for the sleeve so I should get it next week.  I wear the sleeve that you gave me and that helps.   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 She had a right lumpectomy and senitnel node biopsy (5 negative nodes) on 04/20/2021. It is ER positive, PR negative, and HER2 positive with a Ki67 of 80%.   ° Currently in Pain? No/denies   ° Pain Score 0-No pain   ° °  °  ° °  ° ° ° ° ° ° ° ° ° ° ° ° ° ° ° ° ° ° ° ° [facility] Adult PT Treatment/Exercise - 06/08/21 0001   ° °  ° Shoulder Exercises: Supine  ° Horizontal ABduction Strengthening;Both;10 reps;Theraband   pt returned therapist demo  ° Theraband Level (Shoulder Horizontal ABduction) Level 1 (Yellow)   ° External Rotation Strengthening;Both;10 reps;Theraband   pt returned therapist demo  ° Theraband Level (Shoulder External Rotation) Level 1 (Yellow)   ° Flexion Strengthening;Both;10 reps   ° Theraband Level (Shoulder Flexion) Level 1 (Yellow)   ° Diagonals Strengthening;Right;Left;10 reps   ° Theraband Level (Shoulder Diagonals) Level 1 (Yellow)   °  ° Shoulder Exercises: Standing  ° ABduction AROM;Right;5 reps   wall slide  ° Extension Strengthening;Both;10 reps   ° Theraband Level (Shoulder Extension) Level 1 (Yellow)   ° Retraction Strengthening;Both;10 reps   ° Theraband Level (Shoulder Retraction) Level 1 (Yellow)   °  ° Manual   Therapy   Manual Therapy Myofascial release;Passive ROM    Myofascial Release To Rt axilla and upper arm during P/ROM at areas of cording    Passive ROM In Supine to Rt shoulder into flexion, abduction                          PT Long Term Goals - 06/01/21 1109       PT LONG TERM GOAL #1   Title Patient will demonstrate she has regained full shoulder ROM and function post operatively compared to baselines.    Time 8    Period Weeks    Status Achieved    Target Date 05/17/21      PT LONG TERM GOAL #2   Title Patient will report >/= 50% less c/o tightness at end ROM right shoulder.    Time 4    Period Weeks    Status Achieved    Target Date 06/01/21      PT LONG TERM GOAL #3   Title  Patient will demonstrate >/= 50% reduction in visible cording (can compare photo under media tab)    Time 4    Period Weeks    Status Achieved    Target Date 06/01/21      PT LONG TERM GOAL #4   Title Pt will be ind with final HEP for continued stretching    Time 3    Period Weeks    Status New    Target Date 06/15/21                  che Plan - 06/08/21 1151     Clinical Impression Statement Pt did alot of activity last Sunday and had a return of tightness from cording and small Knot in the axillary area near her incision.  She has been working the tightness out, but ROM was more limited today than at last visit.  Cords remain very tight but will not release.  We reviewed theraband exercises and she did well overall, with occasional VC's for form mainly with scapular retraction in standing.  Reminded pt to perform abduction wall slides as she had forgotten these.    Stability/Clinical Decision Making Stable/Uncomplicated    Rehab Potential Excellent    PT Frequency 1x / week    PT Duration 4 weeks    PT Treatment/Interventions ADLs/Self Care Home Management;Therapeutic exercise;Patient/family education;Manual techniques;Manual lymph drainage;Passive range of motion    PT Next Visit Plan Check sleeve,pt to come 1x/week,DC if ready or may continue 1x/week Will call if she feels she needs to come in before then, check cording, continue MFR, review exs    PT Home Exercise Plan Post op HEP and neural stretching, supine scap series    Consulted and Agree with Plan of Care Patient             Patient will benefit from skilled therapeutic intervention in order to improve the following deficits and impairments:  Postural dysfunction, Decreased range of motion, Decreased knowledge of precautions, Impaired UE functional use, Pain, Increased fascial restricitons  Visit Diagnosis: Pain in right arm  Aftercare following surgery for neoplasm  Abnormal posture  Malignant neoplasm  of upper-outer quadrant of right breast in female, estrogen receptor positive ([facility])     Problem List Patient Active Problem List   Diagnosis Date Noted   Port-A-Cath in place 05/18/2021   Genetic testing 04/03/2021   Family history of breast cancer 03/23/2021  Malignant neoplasm of upper-outer quadrant of right breast in female, estrogen receptor positive ([facility]) 03/20/2021   Osteoporosis 12/10/2018    Claris Pong, PT 06/08/2021, 11:54 AM  [facility] [place], [place], [address] Phone: [phone]   Fax:  [phone]  Name: Susan Reid MRN: [id number] Date of Birth: September 24, 1965

## 2021-06-09 ENCOUNTER — Inpatient Hospital Stay: Payer: Managed Care, Other (non HMO)

## 2021-06-09 VITALS — BP 121/80 | HR 87 | Temp 98.1°F | Resp 16 | Wt 124.4 lb

## 2021-06-09 DIAGNOSIS — C50411 Malignant neoplasm of upper-outer quadrant of right female breast: Secondary | ICD-10-CM

## 2021-06-09 DIAGNOSIS — Z170 Estrogen receptor positive status [ER+]: Secondary | ICD-10-CM

## 2021-06-09 DIAGNOSIS — Z95828 Presence of other vascular implants and grafts: Secondary | ICD-10-CM

## 2021-06-09 DIAGNOSIS — Z5112 Encounter for antineoplastic immunotherapy: Secondary | ICD-10-CM | POA: Diagnosis not present

## 2021-06-09 LAB — CMP (CANCER CENTER ONLY)
ALT: 26 U/L (ref 0–44)
AST: 21 U/L (ref 15–41)
Albumin: 4.3 g/dL (ref 3.5–5.0)
Alkaline Phosphatase: 74 U/L (ref 38–126)
Anion gap: 4 — ABNORMAL LOW (ref 5–15)
BUN: 18 mg/dL (ref 6–20)
CO2: 31 mmol/L (ref 22–32)
Calcium: 9.4 mg/dL (ref 8.9–10.3)
Chloride: 105 mmol/L (ref 98–111)
Creatinine: 0.69 mg/dL (ref 0.44–1.00)
GFR, Estimated: >60 mL/min (ref >60)
Glucose, Bld: 84 mg/dL (ref 70–99)
Potassium: 4 mmol/L (ref 3.5–5.1)
Sodium: 140 mmol/L (ref 135–145)
Total Bilirubin: 0.4 mg/dL (ref 0.3–1.2)
Total Protein: 6.6 g/dL (ref 6.5–8.1)

## 2021-06-09 LAB — CBC WITH DIFFERENTIAL (CANCER CENTER ONLY)
Abs Immature Granulocytes: 0.02 10*3/uL (ref 0.00–0.07)
Basophils Absolute: 0 10*3/uL (ref 0.0–0.1)
Basophils Relative: 1 %
Eosinophils Absolute: 0.1 10*3/uL (ref 0.0–0.5)
Eosinophils Relative: 2 %
HCT: 38.1 % (ref 36.0–46.0)
Hemoglobin: 12.3 g/dL (ref 12.0–15.0)
Immature Granulocytes: 1 %
Lymphocytes Relative: 42 %
Lymphs Abs: 1.8 10*3/uL (ref 0.7–4.0)
MCH: 29.9 pg (ref 26.0–34.0)
MCHC: 32.3 g/dL (ref 30.0–36.0)
MCV: 92.5 fL (ref 80.0–100.0)
Monocytes Absolute: 0.3 10*3/uL (ref 0.1–1.0)
Monocytes Relative: 8 %
Neutro Abs: 2 10*3/uL (ref 1.7–7.7)
Neutrophils Relative %: 46 %
Platelet Count: 293 10*3/uL (ref 150–400)
RBC: 4.12 MIL/uL (ref 3.87–5.11)
RDW: 13.5 % (ref 11.5–15.5)
WBC Count: 4.3 10*3/uL (ref 4.0–10.5)
nRBC: 0 % (ref 0.0–0.2)

## 2021-06-09 MED ORDER — ACETAMINOPHEN 325 MG PO TABS
650.0000 mg | ORAL_TABLET | Freq: Once | ORAL | Status: AC
  Administered 2021-06-09: 650 mg via ORAL
  Filled 2021-06-09: qty 2

## 2021-06-09 MED ORDER — FAMOTIDINE IN NACL 20-0.9 MG/50ML-% IV SOLN
20.0000 mg | Freq: Once | INTRAVENOUS | Status: AC
  Administered 2021-06-09: 20 mg via INTRAVENOUS
  Filled 2021-06-09: qty 50

## 2021-06-09 MED ORDER — TRASTUZUMAB-ANNS CHEMO 150 MG IV SOLR
2.0000 mg/kg | Freq: Once | INTRAVENOUS | Status: AC
  Administered 2021-06-09: 105 mg via INTRAVENOUS
  Filled 2021-06-09: qty 5

## 2021-06-09 MED ORDER — SODIUM CHLORIDE 0.9% FLUSH
10.0000 mL | Freq: Once | INTRAVENOUS | Status: AC
  Administered 2021-06-09: 10 mL

## 2021-06-09 MED ORDER — SODIUM CHLORIDE 0.9 % IV SOLN: Freq: Once | INTRAVENOUS | Status: AC

## 2021-06-09 MED ORDER — DIPHENHYDRAMINE HCL 50 MG/ML IJ SOLN
12.5000 mg | Freq: Once | INTRAMUSCULAR | Status: AC
  Administered 2021-06-09: 12.5 mg via INTRAVENOUS
  Filled 2021-06-09: qty 1

## 2021-06-09 MED ORDER — HEPARIN SOD (PORK) LOCK FLUSH 100 UNIT/ML IV SOLN
500.0000 [IU] | Freq: Once | INTRAVENOUS | Status: AC | PRN
  Administered 2021-06-09: 500 [IU]

## 2021-06-09 MED ORDER — SODIUM CHLORIDE 0.9 % IV SOLN
80.0000 mg/m2 | Freq: Once | INTRAVENOUS | Status: AC
  Administered 2021-06-09: 126 mg via INTRAVENOUS
  Filled 2021-06-09: qty 21

## 2021-06-09 MED ORDER — SODIUM CHLORIDE 0.9 % IV SOLN
10.0000 mg | Freq: Once | INTRAVENOUS | Status: AC
  Administered 2021-06-09: 10 mg via INTRAVENOUS
  Filled 2021-06-09: qty 10

## 2021-06-09 MED ORDER — SODIUM CHLORIDE 0.9% FLUSH
10.0000 mL | INTRAVENOUS | Status: DC | PRN
  Administered 2021-06-09: 10 mL

## 2021-06-09 NOTE — Patient Instructions (Signed)
[facility]  Discharge Instructions: Thank you for choosing [facility] to provide your oncology and hematology care.   If you have a lab appointment with the [facility], please go directly to the [facility] and check in at the registration area.   Wear comfortable clothing and clothing appropriate for easy access to any Portacath or PICC line.   We strive to give you quality time with your provider. You may need to reschedule your appointment if you arrive late (15 or more minutes).  Arriving late affects you and other patients whose appointments are after yours.  Also, if you miss three or more appointments without notifying the office, you may be dismissed from the clinic at the providers discretion.      For prescription refill requests, have your pharmacy contact our office and allow 72 hours for refills to be completed.    Today you received the following chemotherapy and/or immunotherapy agents Trastuzumab & Paclitaxel      To help prevent nausea and vomiting after your treatment, we encourage you to take your nausea medication as directed.  BELOW ARE SYMPTOMS THAT SHOULD BE REPORTED IMMEDIATELY: *FEVER GREATER THAN 100.4 F (38 C) OR HIGHER *CHILLS OR SWEATING *NAUSEA AND VOMITING THAT IS NOT CONTROLLED WITH YOUR NAUSEA MEDICATION *UNUSUAL SHORTNESS OF BREATH *UNUSUAL BRUISING OR BLEEDING *URINARY PROBLEMS (pain or burning when urinating, or frequent urination) *BOWEL PROBLEMS (unusual diarrhea, constipation, pain near the anus) TENDERNESS IN MOUTH AND THROAT WITH OR WITHOUT PRESENCE OF ULCERS (sore throat, sores in mouth, or a toothache) UNUSUAL RASH, SWELLING OR PAIN  UNUSUAL VAGINAL DISCHARGE OR ITCHING   Items with * indicate a potential emergency and should be followed up as soon as possible or go to the Emergency Department if any problems should occur.  Please show the CHEMOTHERAPY ALERT CARD or IMMUNOTHERAPY ALERT CARD  at check-in to the Emergency Department and triage nurse.  Should you have questions after your visit or need to cancel or reschedule your appointment, please contact [facility]  Dept: [phone]  and follow the prompts.  Office hours are 8:00 a.m. to 4:30 p.m. Monday - Friday. Please note that voicemails left after 4:00 p.m. may not be returned until the following business day.  We are closed weekends and major holidays. You have access to a nurse at all times for urgent questions. Please call the main number to the clinic Dept: [phone] and follow the prompts.   For any non-urgent questions, you may also contact your provider using MyChart. We now offer e-Visits for anyone 45 and older to request care online for non-urgent symptoms. For details visit [email or web address].   Also download the MyChart app! Go to the app store, search "MyChart", open the app, select [facility], and log in with your MyChart username and password.  Due to Covid, a mask is required upon entering the hospital/clinic. If you do not have a mask, one will be given to you upon arrival. For doctor visits, patients may have 1 support person aged 61 or older with them. For treatment visits, patients cannot have anyone with them due to current Covid guidelines and our immunocompromised population.   Trastuzumab injection for infusion What is this medication? TRASTUZUMAB (tras TOO zoo mab) is a monoclonal antibody. It is used to treat breast cancer and stomach cancer. This medicine may be used for other purposes; ask your health care provider or pharmacist if you have questions. COMMON  BRAND NAME(S): Herceptin, Galvin Proffer, Trazimera What should I tell my care team before I take this medication? They need to know if you have any of these conditions: heart disease heart failure lung or breathing disease, like asthma an unusual or allergic reaction to  trastuzumab, benzyl alcohol, or other medications, foods, dyes, or preservatives pregnant or trying to get pregnant breast-feeding How should I use this medication? This drug is given as an infusion into a vein. It is administered in a hospital or clinic by a specially trained health care professional. Talk to your pediatrician regarding the use of this medicine in children. This medicine is not approved for use in children. Overdosage: If you think you have taken too much of this medicine contact a poison control center or emergency room at once. NOTE: This medicine is only for you. Do not share this medicine with others. What if I miss a dose? It is important not to miss a dose. Call your doctor or health care professional if you are unable to keep an appointment. What may interact with this medication? This medicine may interact with the following medications: certain types of chemotherapy, such as daunorubicin, doxorubicin, epirubicin, and idarubicin This list may not describe all possible interactions. Give your health care provider a list of all the medicines, herbs, non-prescription drugs, or dietary supplements you use. Also tell them if you smoke, drink alcohol, or use illegal drugs. Some items may interact with your medicine. What should I watch for while using this medication? Visit your doctor for checks on your progress. Report any side effects. Continue your course of treatment even though you feel ill unless your doctor tells you to stop. Call your doctor or health care professional for advice if you get a fever, chills or sore throat, or other symptoms of a cold or flu. Do not treat yourself. Try to avoid being around people who are sick. You may experience fever, chills and shaking during your first infusion. These effects are usually mild and can be treated with other medicines. Report any side effects during the infusion to your health care professional. Fever and chills usually  do not happen with later infusions. Do not become pregnant while taking this medicine or for 7 months after stopping it. Women should inform their doctor if they wish to become pregnant or think they might be pregnant. Women of child-bearing potential will need to have a negative pregnancy test before starting this medicine. There is a potential for serious side effects to an unborn child. Talk to your health care professional or pharmacist for more information. Do not breast-feed an infant while taking this medicine or for 7 months after stopping it. Women must use effective birth control with this medicine. What side effects may I notice from receiving this medication? Side effects that you should report to your doctor or health care professional as soon as possible: allergic reactions like skin rash, itching or hives, swelling of the face, lips, or tongue chest pain or palpitations cough dizziness feeling faint or lightheaded, falls fever general ill feeling or flu-like symptoms signs of worsening heart failure like breathing problems; swelling in your legs and feet unusually weak or tired Side effects that usually do not require medical attention (report to your doctor or health care professional if they continue or are bothersome): bone pain changes in taste diarrhea joint pain nausea/vomiting weight loss This list may not describe all possible side effects. Call your doctor for medical advice about  side effects. You may report side effects to FDA at 1-800-FDA-1088. Where should I keep my medication? This drug is given in a hospital or clinic and will not be stored at home. NOTE: This sheet is a summary. It may not cover all possible information. If you have questions about this medicine, talk to your doctor, pharmacist, or health care provider.  2022 Elsevier/Gold Standard (2016-04-24 00:00:00)  Paclitaxel injection What is this medication? PACLITAXEL (PAK li TAX el) is a  chemotherapy drug. It targets fast dividing cells, like cancer cells, and causes these cells to die. This medicine is used to treat ovarian cancer, breast cancer, lung cancer, Kaposi's sarcoma, and other cancers. This medicine may be used for other purposes; ask your health care provider or pharmacist if you have questions. COMMON BRAND NAME(S): Onxol, Taxol What should I tell my care team before I take this medication? They need to know if you have any of these conditions: history of irregular heartbeat liver disease low blood counts, like low white cell, platelet, or red cell counts lung or breathing disease, like asthma tingling of the fingers or toes, or other nerve disorder an unusual or allergic reaction to paclitaxel, alcohol, polyoxyethylated castor oil, other chemotherapy, other medicines, foods, dyes, or preservatives pregnant or trying to get pregnant breast-feeding How should I use this medication? This drug is given as an infusion into a vein. It is administered in a hospital or clinic by a specially trained health care professional. Talk to your pediatrician regarding the use of this medicine in children. Special care may be needed. Overdosage: If you think you have taken too much of this medicine contact a poison control center or emergency room at once. NOTE: This medicine is only for you. Do not share this medicine with others. What if I miss a dose? It is important not to miss your dose. Call your doctor or health care professional if you are unable to keep an appointment. What may interact with this medication? Do not take this medicine with any of the following medications: live virus vaccines This medicine may also interact with the following medications: antiviral medicines for hepatitis, HIV or AIDS certain antibiotics like erythromycin and clarithromycin certain medicines for fungal infections like ketoconazole and itraconazole certain medicines for seizures like  carbamazepine, phenobarbital, phenytoin gemfibrozil nefazodone rifampin St. John's wort This list may not describe all possible interactions. Give your health care provider a list of all the medicines, herbs, non-prescription drugs, or dietary supplements you use. Also tell them if you smoke, drink alcohol, or use illegal drugs. Some items may interact with your medicine. What should I watch for while using this medication? Your condition will be monitored carefully while you are receiving this medicine. You will need important blood work done while you are taking this medicine. This medicine can cause serious allergic reactions. To reduce your risk you will need to take other medicine(s) before treatment with this medicine. If you experience allergic reactions like skin rash, itching or hives, swelling of the face, lips, or tongue, tell your doctor or health care professional right away. In some cases, you may be given additional medicines to help with side effects. Follow all directions for their use. This drug may make you feel generally unwell. This is not uncommon, as chemotherapy can affect healthy cells as well as cancer cells. Report any side effects. Continue your course of treatment even though you feel ill unless your doctor tells you to stop. Call your doctor or  health care professional for advice if you get a fever, chills or sore throat, or other symptoms of a cold or flu. Do not treat yourself. This drug decreases your body's ability to fight infections. Try to avoid being around people who are sick. This medicine may increase your risk to bruise or bleed. Call your doctor or health care professional if you notice any unusual bleeding. Be careful brushing and flossing your teeth or using a toothpick because you may get an infection or bleed more easily. If you have any dental work done, tell your dentist you are receiving this medicine. Avoid taking products that contain aspirin,  acetaminophen, ibuprofen, naproxen, or ketoprofen unless instructed by your doctor. These medicines may hide a fever. Do not become pregnant while taking this medicine. Women should inform their doctor if they wish to become pregnant or think they might be pregnant. There is a potential for serious side effects to an unborn child. Talk to your health care professional or pharmacist for more information. Do not breast-feed an infant while taking this medicine. Men are advised not to father a child while receiving this medicine. This product may contain alcohol. Ask your pharmacist or healthcare provider if this medicine contains alcohol. Be sure to tell all healthcare providers you are taking this medicine. Certain medicines, like metronidazole and disulfiram, can cause an unpleasant reaction when taken with alcohol. The reaction includes flushing, headache, nausea, vomiting, sweating, and increased thirst. The reaction can last from 30 minutes to several hours. What side effects may I notice from receiving this medication? Side effects that you should report to your doctor or health care professional as soon as possible: allergic reactions like skin rash, itching or hives, swelling of the face, lips, or tongue breathing problems changes in vision fast, irregular heartbeat high or low blood pressure mouth sores pain, tingling, numbness in the hands or feet signs of decreased platelets or bleeding - bruising, pinpoint red spots on the skin, black, tarry stools, blood in the urine signs of decreased red blood cells - unusually weak or tired, feeling faint or lightheaded, falls signs of infection - fever or chills, cough, sore throat, pain or difficulty passing urine signs and symptoms of liver injury like dark yellow or brown urine; general ill feeling or flu-like symptoms; light-colored stools; loss of appetite; nausea; right upper belly pain; unusually weak or tired; yellowing of the eyes or  skin swelling of the ankles, feet, hands unusually slow heartbeat Side effects that usually do not require medical attention (report to your doctor or health care professional if they continue or are bothersome): diarrhea hair loss loss of appetite muscle or joint pain nausea, vomiting pain, redness, or irritation at site where injected tiredness This list may not describe all possible side effects. Call your doctor for medical advice about side effects. You may report side effects to FDA at 1-800-FDA-1088. Where should I keep my medication? This drug is given in a hospital or clinic and will not be stored at home. NOTE: This sheet is a summary. It may not cover all possible information. If you have questions about this medicine, talk to your doctor, pharmacist, or health care provider.  2022 Elsevier/Gold Standard (2020-12-27 00:00:00)

## 2021-06-13 ENCOUNTER — Encounter: Payer: Self-pay | Admitting: Physical Therapy

## 2021-06-15 ENCOUNTER — Ambulatory Visit: Payer: Managed Care, Other (non HMO)

## 2021-06-15 ENCOUNTER — Other Ambulatory Visit: Payer: Self-pay

## 2021-06-15 DIAGNOSIS — R293 Abnormal posture: Secondary | ICD-10-CM

## 2021-06-15 DIAGNOSIS — Z170 Estrogen receptor positive status [ER+]: Secondary | ICD-10-CM

## 2021-06-15 DIAGNOSIS — Z483 Aftercare following surgery for neoplasm: Secondary | ICD-10-CM

## 2021-06-15 DIAGNOSIS — C50411 Malignant neoplasm of upper-outer quadrant of right female breast: Secondary | ICD-10-CM

## 2021-06-15 DIAGNOSIS — M79601 Pain in right arm: Secondary | ICD-10-CM

## 2021-06-15 MED FILL — Dexamethasone Sodium Phosphate Inj 100 MG/10ML: INTRAMUSCULAR | Qty: 1 | Status: AC

## 2021-06-15 NOTE — Assessment & Plan Note (Signed)
03/14/2021:Palpable right breast mass diagnostic mammogram: 1.6 cm mass in the right breast at 9 o'clock. 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 04/20/21: Rt Lumpectomy: Grade 3 IDC 0/5 Ln neg, Margins Neg,Her2+, Copy #5.85, ratio 3.16 ER+(60%)/PR+(0%), Ki-67 80%.  Treatment Plan: 1.adjuvant chemotherapy with Taxol Herceptin followed by Herceptin maintenance 2.Adjuvant radiation therapy 3.Adjuvant antiestrogen therapy --------------------------------------------------------------------------------------------------------------------------------------------------- Current treatment: Cycle 4 Taxol Herceptin Echocardiogram 04/05/2021: EF 59% Labs reviewed Chemo toxicities: 1. Slight infusion reaction: Lasted for less than a few seconds where she had some chest pain and pelvic pain. 2. lack of sleep: She will try melatonin over-the-counter.  Denies any nausea or vomiting or neuropathy or any other side effects from treatment.  She has excellent energy levels. Return to clinic in weekly for chemo

## 2021-06-15 NOTE — Progress Notes (Signed)
Patient Care Team: Lesleigh Noe, MD as PCP - General (Family Medicine) Druscilla Brownie, MD as Consulting Physician (Dermatology) Rockwell Germany, RN as Oncology Nurse Navigator Tressie Aina, Paulette Blanch, RN as Oncology Nurse Navigator Stark Klein, MD as Consulting Physician (General Surgery) [name], MD as Consulting Physician (Hematology and Oncology) Gery Pray, MD as Consulting Physician (Radiation Oncology)  DIAGNOSIS:    ICD-10-CM   1. Malignant neoplasm of upper-outer quadrant of right breast in female, estrogen receptor positive ([place])  C50.411    Z17.0       SUMMARY OF ONCOLOGIC HISTORY: Oncology History  Malignant neoplasm of upper-outer quadrant of right breast in female, estrogen receptor positive ([place])  03/14/2021 Initial Diagnosis   Palpable right breast mass diagnostic mammogram: 1.6 cm mass in the right breast at 9 o'clock. 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    03/22/2021 Cancer Staging   Staging form: Breast, AJCC 8th Edition - Clinical stage from 03/22/2021: Stage IA (cT1b, cN0, cM0, G3, ER+, PR-, HER2+) - Signed by [name], MD on 03/22/2021 Stage prefix: Initial diagnosis Histologic grading system: 3 grade system     Genetic Testing   [facility] CustomNext Panel was Negative. Report date is 04/03/2021.  The CustomNext-Cancer+RNAinsight panel offered by [facility] includes sequencing and rearrangement analysis for the following 47 genes:  APC, ATM, AXIN2, BARD1, BMPR1A, BRCA1, BRCA2, BRIP1, CDH1, CDK4, CDKN2A, CHEK2, CTNNA1, DICER1, EPCAM, GREM1, HOXB13, KIT, MEN1, MLH1, MSH2, MSH3, MSH6, MUTYH, NBN, NF1, NTHL1, PALB2, PDGFRA, PMS2, POLD1, POLE, PTEN, RAD50, RAD51C, RAD51D, SDHA, SDHB, SDHC, SDHD, SMAD4, SMARCA4, STK11, TP53, TSC1, TSC2, and VHL.  RNA data is routinely analyzed for use in variant interpretation for all genes.   04/20/2021 Surgery   Rt Lumpectomy: Grade 3 IDC 1.9 cm  0/5 Ln neg, Margins Neg, Her2+, Copy #5.85, ratio 3.16 ER+(60%)/PR+(0%), Ki-67 80%   05/18/2021 -  Chemotherapy   Patient is on Treatment Plan : BREAST Paclitaxel + Trastuzumab q7d / Trastuzumab q21d       CHIEF COMPLIANT: Cycle 5 Taxol and Herceptin   INTERVAL HISTORY: Susan Reid is a 56 y.o. with above-mentioned history of  breast cancer, currently on chemotherapy on Paclitaxel + Trastuzumab. She presents to the clinic today for follow-up and treatment.  So far she is tolerating chemotherapy extremely well.  Denies any nausea or vomiting.  Mild fatigue.  ALLERGIES:  is allergic to nitrates, organic.  MEDICATIONS:  Current Outpatient Medications  Medication Sig Dispense Refill   acetaminophen (TYLENOL) 325 MG tablet Take 1,000 mg by mouth every 6 (six) hours as needed.     Ascorbic Acid (VITAMIN C PO) Take 500 mg by mouth daily.     Calcium Citrate 1040 MG TABS Take 2,080 mg of elemental calcium by mouth daily.     cholecalciferol (VITAMIN D) 1000 UNITS tablet Take 1,000 Units by mouth daily.     fluticasone (FLONASE) 50 MCG/ACT nasal spray Place 1 spray into both nostrils daily as needed for allergies or rhinitis.     lidocaine-prilocaine (EMLA) cream Apply to affected area once 30 g 3   loratadine (CLARITIN) 10 MG tablet Take 10 mg by mouth daily as needed for allergies.     LORazepam (ATIVAN) 0.5 MG tablet Take 1 tablet (0.5 mg total) by mouth at bedtime as needed (Nausea or vomiting). 30 tablet 0   Multiple Vitamin (MULTIVITAMIN) tablet Take 1 tablet by mouth daily.     ondansetron (ZOFRAN) 8  MG tablet Take 1 tablet (8 mg total) by mouth 2 (two) times daily as needed (Nausea or vomiting). 30 tablet 1   prochlorperazine (COMPAZINE) 10 MG tablet Take 1 tablet (10 mg total) by mouth every 6 (six) hours as needed (Nausea or vomiting). 30 tablet 1   No current facility-administered medications for this visit.    PHYSICAL EXAMINATION: ECOG PERFORMANCE STATUS: 1 - Symptomatic but  completely ambulatory  Vitals:   06/16/21 0957  BP: 112/72  Pulse: 73  Resp: 17  Temp: 97.7 F (36.5 C)  SpO2: 100%   Filed Weights   06/16/21 0957  Weight: 125 lb 9.6 oz (57 kg)    LABORATORY DATA:  I have reviewed the data as listed CMP Latest Ref Rng & Units 06/09/2021 06/02/2021 05/26/2021  Glucose 70 - 99 mg/dL 84 81 76  BUN 6 - 20 mg/dL 18 21(H) 17  Creatinine 0.44 - 1.00 mg/dL 0.69 0.66 0.74  Sodium 135 - 145 mmol/L 140 141 142  Potassium 3.5 - 5.1 mmol/L 4.0 4.2 4.1  Chloride 98 - 111 mmol/L 105 106 106  CO2 22 - 32 mmol/L 31 30 30   Calcium 8.9 - 10.3 mg/dL 9.4 9.5 9.7  Total Protein 6.5 - 8.1 g/dL 6.6 6.7 6.9  Total Bilirubin 0.3 - 1.2 mg/dL 0.4 0.4 0.4  Alkaline Phos 38 - 126 U/L 74 85 92  AST 15 - 41 U/L 21 19 20   ALT 0 - 44 U/L 26 25 32    Lab Results  Component Value Date   WBC 3.5 (L) 06/16/2021   HGB 11.6 (L) 06/16/2021   HCT 35.6 (L) 06/16/2021   MCV 93.4 06/16/2021   PLT 285 06/16/2021   NEUTROABS 1.7 06/16/2021    ASSESSMENT & PLAN:  Malignant neoplasm of upper-outer quadrant of right breast in female, estrogen receptor positive ([place]) 03/14/2021:Palpable right breast mass diagnostic mammogram: 1.6 cm mass in the right breast at 9 o'clock. 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  04/20/21: Rt Lumpectomy: Grade 3 IDC 0/5 Ln neg, Margins Neg, Her2+, Copy #5.85, ratio 3.16 ER+(60%)/PR+(0%), Ki-67 80%.   Treatment Plan: 1. adjuvant chemotherapy with Taxol Herceptin followed by Herceptin maintenance 2.  Adjuvant radiation therapy 3.  Adjuvant antiestrogen therapy --------------------------------------------------------------------------------------------------------------------------------------------------- Current treatment: Cycle 4 Taxol Herceptin Echocardiogram 04/05/2021: EF 59% Labs reviewed Chemo toxicities: Slight infusion reaction: Lasted for less than a few seconds where she had  some chest pain and pelvic pain.  This problem resolved when the nurses were shaking the chemo back prior to infusing. lack of sleep: She will try melatonin over-the-counter. Leukopenia: ANC today is 1.7.  We are watching this.   Denies any nausea or vomiting or neuropathy or any other side effects from treatment.  She has excellent energy levels. Return to clinic in weekly for chemo      No orders of the defined types were placed in this encounter.  The patient has a good understanding of the overall plan. she agrees with it. she will call with any problems that may develop before the next visit here.  Total time spent: 30 mins including face to face time and time spent for planning, charting and coordination of care  Susan Eisenmenger, MD, MPH 06/16/2021  I, Thana Ates, am acting as scribe for Dr. [name].  I have reviewed the above documentation for accuracy and completeness, and I agree with the above.

## 2021-06-15 NOTE — Therapy (Signed)
[facility] @ [place], [place], [address] Phone: [phone]   Fax:  [phone]  Physical Therapy Treatment  Patient Details  Name: Susan Reid MRN: [id number] Date of Birth: 1966/03/22 Referring Provider (PT): Dr. Stark Klein   Encounter Date: 06/15/2021   PT End of Session - 06/15/21 1352     Visit Number 9    Number of Visits 11    Date for PT Re-Evaluation 06/29/21    PT Start Time 7867    PT Stop Time 6720    PT Time Calculation (min) 48 min    Activity Tolerance Patient tolerated treatment well    Behavior During Therapy [facility] for tasks assessed/performed             Past Medical History:  Diagnosis Date   Allergy    Family history of adverse reaction to anesthesia    Mother got severe N/V and pneumonia following anesthesia   Osteopenia     Past Surgical History:  Procedure Laterality Date   AXILLARY SENTINEL NODE BIOPSY Right 04/20/2021   Procedure: RIGHT AXILLARY SENTINEL NODE BIOPSY;  Surgeon: Stark Klein, MD;  Location: [facility];  Service: General;  Laterality: Right;   BREAST BIOPSY Right 02/25/2020   [place]   BREAST BIOPSY Right 03/14/2021   BREAST LUMPECTOMY WITH SENTINEL LYMPH NODE BIOPSY Right 04/20/2021   Procedure: RIGHT BREAST LUMPECTOMY;  Surgeon: Stark Klein, MD;  Location: [facility];  Service: General;  Laterality: Right;   PORTACATH PLACEMENT Left 04/20/2021   Procedure: INSERTION PORT-A-CATH;  Surgeon: Stark Klein, MD;  Location: [facility];  Service: General;  Laterality: Left;   WISDOM TOOTH EXTRACTION  04/24/1987    There were no vitals filed for this visit.   Subjective Assessment - 06/15/21 1352     Subjective I brought my husband to learn cording release.  I haven't heard about the sleeve yet.. Did better after PT last week until I started doing more at home, Haven't been wearing  the TG soft except at night.    Patient is accompained by: Family member   husband    Pertinent History Patient was diagnosed on 03/07/2021 with 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 She had a right lumpectomy and senitnel node biopsy (5 negative nodes) on 04/20/2021. It is ER positive, PR negative, and HER2 positive with a Ki67 of 80%.    Patient Stated Goals See if my arm is doing ok    Currently in Pain? No/denies    Pain Score 0-No pain                               [facility] Adult PT Treatment/Exercise - 06/15/21 0001       Manual Therapy   Manual therapy comments Pts husband instructed in [place] for cording with longitudinal and S technique. He watched and practiced while pt  comunicated prn about pressure    Soft tissue mobilization to right pectorals with cocoa butter    Myofascial Release To Rt axilla and upper arm, antecubital fossa to   areas of cording    Passive ROM In Supine to Rt shoulder into flexion, abduction, ER                          PT Long Term Goals - 06/01/21 1109       PT LONG TERM GOAL #  1   Title Patient will demonstrate she has regained full shoulder ROM and function post operatively compared to baselines.    Time 8    Period Weeks    Status Achieved    Target Date 05/17/21      PT LONG TERM GOAL #2   Title Patient will report >/= 50% less c/o tightness at end ROM right shoulder.    Time 4    Period Weeks    Status Achieved    Target Date 06/01/21      PT LONG TERM GOAL #3   Title Patient will demonstrate >/= 50% reduction in visible cording (can compare photo under media tab)    Time 4    Period Weeks    Status Achieved    Target Date 06/01/21      PT LONG TERM GOAL #4   Title Pt will be ind with final HEP for continued stretching    Time 3    Period Weeks    Status New    Target Date 06/15/21                   Plan - 06/15/21 1450     Clinical Impression Statement Pt broughter her husband with her to learn manual techniques to release cords.  He watched as I  performed release techniques and then he practiced as well with longitudinal and S techniques.  He did very well with appropriate technique and pressure. Pt is good about communicating if shoulder is not in the correct position.  Pt always feels better and looser after MFR techniques.  They will try and do on a daily basis for 10 min or so a day    Stability/Clinical Decision Making Stable/Uncomplicated    Rehab Potential Excellent    PT Frequency 1x / week    PT Duration 4 weeks    PT Treatment/Interventions ADLs/Self Care Home Management;Therapeutic exercise;Patient/family education;Manual techniques;Manual lymph drainage;Passive range of motion    PT Next Visit Plan pt to continue 1x/week Will call if she feels she needs to come in before then, check cording, continue MFR, review exs    PT Home Exercise Plan Post op HEP and neural stretching, supine scap series    Consulted and Agree with Plan of Care Patient    Family Member Consulted Husband             Patient will benefit from skilled therapeutic intervention in order to improve the following deficits and impairments:  Postural dysfunction, Decreased range of motion, Decreased knowledge of precautions, Impaired UE functional use, Pain, Increased fascial restricitons  Visit Diagnosis: Pain in right arm  Aftercare following surgery for neoplasm  Abnormal posture  Malignant neoplasm of upper-outer quadrant of right breast in female, estrogen receptor positive ([place])     Problem List Patient Active Problem List   Diagnosis Date Noted   Port-A-Cath in place 05/18/2021   Genetic testing 04/03/2021   Family history of breast cancer 03/23/2021   Malignant neoplasm of upper-outer quadrant of right breast in female, estrogen receptor positive ([place]) 03/20/2021   Osteoporosis 12/10/2018    Claris Pong, PT 06/15/2021, 2:54 PM  [facility] @ [place],  [place], [address] Phone: [phone]   Fax:  [phone]  Name: Susan Reid MRN: [id number] Date of Birth: 1965/11/15

## 2021-06-16 ENCOUNTER — Inpatient Hospital Stay (HOSPITAL_BASED_OUTPATIENT_CLINIC_OR_DEPARTMENT_OTHER): Payer: Managed Care, Other (non HMO) | Admitting: Hematology and Oncology

## 2021-06-16 ENCOUNTER — Inpatient Hospital Stay: Payer: Managed Care, Other (non HMO)

## 2021-06-16 DIAGNOSIS — C50411 Malignant neoplasm of upper-outer quadrant of right female breast: Secondary | ICD-10-CM

## 2021-06-16 DIAGNOSIS — Z95828 Presence of other vascular implants and grafts: Secondary | ICD-10-CM

## 2021-06-16 DIAGNOSIS — Z170 Estrogen receptor positive status [ER+]: Secondary | ICD-10-CM | POA: Diagnosis not present

## 2021-06-16 DIAGNOSIS — Z5112 Encounter for antineoplastic immunotherapy: Secondary | ICD-10-CM | POA: Diagnosis not present

## 2021-06-16 LAB — CMP (CANCER CENTER ONLY)
ALT: 24 U/L (ref 0–44)
AST: 19 U/L (ref 15–41)
Albumin: 4.2 g/dL (ref 3.5–5.0)
Alkaline Phosphatase: 75 U/L (ref 38–126)
Anion gap: 5 (ref 5–15)
BUN: 18 mg/dL (ref 6–20)
CO2: 30 mmol/L (ref 22–32)
Calcium: 9.4 mg/dL (ref 8.9–10.3)
Chloride: 106 mmol/L (ref 98–111)
Creatinine: 0.69 mg/dL (ref 0.44–1.00)
GFR, Estimated: >60 mL/min (ref >60)
Glucose, Bld: 85 mg/dL (ref 70–99)
Potassium: 4.2 mmol/L (ref 3.5–5.1)
Sodium: 141 mmol/L (ref 135–145)
Total Bilirubin: 0.4 mg/dL (ref 0.3–1.2)
Total Protein: 6.5 g/dL (ref 6.5–8.1)

## 2021-06-16 LAB — CBC WITH DIFFERENTIAL (CANCER CENTER ONLY)
Abs Immature Granulocytes: 0.01 10*3/uL (ref 0.00–0.07)
Basophils Absolute: 0 10*3/uL (ref 0.0–0.1)
Basophils Relative: 1 %
Eosinophils Absolute: 0.1 10*3/uL (ref 0.0–0.5)
Eosinophils Relative: 2 %
HCT: 35.6 % — ABNORMAL LOW (ref 36.0–46.0)
Hemoglobin: 11.6 g/dL — ABNORMAL LOW (ref 12.0–15.0)
Immature Granulocytes: 0 %
Lymphocytes Relative: 41 %
Lymphs Abs: 1.4 10*3/uL (ref 0.7–4.0)
MCH: 30.4 pg (ref 26.0–34.0)
MCHC: 32.6 g/dL (ref 30.0–36.0)
MCV: 93.4 fL (ref 80.0–100.0)
Monocytes Absolute: 0.2 10*3/uL (ref 0.1–1.0)
Monocytes Relative: 7 %
Neutro Abs: 1.7 10*3/uL (ref 1.7–7.7)
Neutrophils Relative %: 49 %
Platelet Count: 285 10*3/uL (ref 150–400)
RBC: 3.81 MIL/uL — ABNORMAL LOW (ref 3.87–5.11)
RDW: 13.9 % (ref 11.5–15.5)
WBC Count: 3.5 10*3/uL — ABNORMAL LOW (ref 4.0–10.5)
nRBC: 0 % (ref 0.0–0.2)

## 2021-06-16 MED ORDER — SODIUM CHLORIDE 0.9% FLUSH
10.0000 mL | Freq: Once | INTRAVENOUS | Status: AC
  Administered 2021-06-16: 10 mL

## 2021-06-16 MED ORDER — FAMOTIDINE IN NACL 20-0.9 MG/50ML-% IV SOLN
20.0000 mg | Freq: Once | INTRAVENOUS | Status: AC
  Administered 2021-06-16: 20 mg via INTRAVENOUS
  Filled 2021-06-16: qty 50

## 2021-06-16 MED ORDER — SODIUM CHLORIDE 0.9 % IV SOLN
80.0000 mg/m2 | Freq: Once | INTRAVENOUS | Status: AC
  Administered 2021-06-16: 126 mg via INTRAVENOUS
  Filled 2021-06-16: qty 21

## 2021-06-16 MED ORDER — SODIUM CHLORIDE 0.9% FLUSH
10.0000 mL | INTRAVENOUS | Status: DC | PRN
  Administered 2021-06-16: 10 mL

## 2021-06-16 MED ORDER — SODIUM CHLORIDE 0.9 % IV SOLN: Freq: Once | INTRAVENOUS | Status: AC

## 2021-06-16 MED ORDER — DIPHENHYDRAMINE HCL 50 MG/ML IJ SOLN
12.5000 mg | Freq: Once | INTRAMUSCULAR | Status: AC
  Administered 2021-06-16: 12.5 mg via INTRAVENOUS
  Filled 2021-06-16: qty 1

## 2021-06-16 MED ORDER — HEPARIN SOD (PORK) LOCK FLUSH 100 UNIT/ML IV SOLN
500.0000 [IU] | Freq: Once | INTRAVENOUS | Status: AC | PRN
  Administered 2021-06-16: 500 [IU]

## 2021-06-16 MED ORDER — ACETAMINOPHEN 325 MG PO TABS
650.0000 mg | ORAL_TABLET | Freq: Once | ORAL | Status: AC
  Administered 2021-06-16: 650 mg via ORAL
  Filled 2021-06-16: qty 2

## 2021-06-16 MED ORDER — SODIUM CHLORIDE 0.9 % IV SOLN
10.0000 mg | Freq: Once | INTRAVENOUS | Status: AC
  Administered 2021-06-16: 10 mg via INTRAVENOUS
  Filled 2021-06-16: qty 10

## 2021-06-16 MED ORDER — TRASTUZUMAB-ANNS CHEMO 150 MG IV SOLR
2.0000 mg/kg | Freq: Once | INTRAVENOUS | Status: AC
  Administered 2021-06-16: 105 mg via INTRAVENOUS
  Filled 2021-06-16: qty 5

## 2021-06-22 ENCOUNTER — Ambulatory Visit: Payer: Managed Care, Other (non HMO) | Attending: General Surgery

## 2021-06-22 ENCOUNTER — Other Ambulatory Visit: Payer: Self-pay

## 2021-06-22 DIAGNOSIS — C50411 Malignant neoplasm of upper-outer quadrant of right female breast: Secondary | ICD-10-CM | POA: Diagnosis present

## 2021-06-22 DIAGNOSIS — M79601 Pain in right arm: Secondary | ICD-10-CM | POA: Insufficient documentation

## 2021-06-22 DIAGNOSIS — Z170 Estrogen receptor positive status [ER+]: Secondary | ICD-10-CM | POA: Insufficient documentation

## 2021-06-22 DIAGNOSIS — Z483 Aftercare following surgery for neoplasm: Secondary | ICD-10-CM | POA: Diagnosis present

## 2021-06-22 DIAGNOSIS — R293 Abnormal posture: Secondary | ICD-10-CM | POA: Insufficient documentation

## 2021-06-22 MED FILL — Dexamethasone Sodium Phosphate Inj 100 MG/10ML: INTRAMUSCULAR | Qty: 1 | Status: AC

## 2021-06-22 NOTE — Therapy (Signed)
[facility] ?[facility] @ [place] ?[place], [place], [address] ?Phone: [phone]   Fax:  [phone] ? ?Physical Therapy Treatment ? ?Patient Details  ?Name: Susan Reid ?MRN: [id number] ?Date of Birth: November 25, 1965 ?Referring Provider (PT): Dr. Stark Klein ? ? ?Encounter Date: 06/22/2021 ? ? PT End of Session - 06/22/21 1307   ? ? Visit Number 10   ? Number of Visits 11   ? Date for PT Re-Evaluation 06/29/21   ? PT Start Time 1308   ? PT Stop Time 9450   ? PT Time Calculation (min) 43 min   ? Activity Tolerance Patient tolerated treatment well   ? Behavior During Therapy [facility] for tasks assessed/performed   ? ?  ?  ? ?  ? ? ?Past Medical History:  ?Diagnosis Date  ? Allergy   ? Family history of adverse reaction to anesthesia   ? Mother got severe N/V and pneumonia following anesthesia  ? Osteopenia   ? ? ?Past Surgical History:  ?Procedure Laterality Date  ? AXILLARY SENTINEL NODE BIOPSY Right 04/20/2021  ? Procedure: RIGHT AXILLARY SENTINEL NODE BIOPSY;  Surgeon: Stark Klein, MD;  Location: [facility];  Service: General;  Laterality: Right;  ? BREAST BIOPSY Right 02/25/2020  ? [place]  ? BREAST BIOPSY Right 03/14/2021  ? BREAST LUMPECTOMY WITH SENTINEL LYMPH NODE BIOPSY Right 04/20/2021  ? Procedure: RIGHT BREAST LUMPECTOMY;  Surgeon: Stark Klein, MD;  Location: [facility];  Service: General;  Laterality: Right;  ? PORTACATH PLACEMENT Left 04/20/2021  ? Procedure: INSERTION PORT-A-CATH;  Surgeon: Stark Klein, MD;  Location: [facility];  Service: General;  Laterality: Left;  ? WISDOM TOOTH EXTRACTION  04/24/1987  ? ? ?There were no vitals filed for this visit. ? ? Subjective Assessment - 06/22/21 1307   ? ? Subjective My husband has been working on the cording.  It seems to be more in the axillary region now.   ? 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 She had a right lumpectomy and senitnel node biopsy  (5 negative nodes) on 04/20/2021. It is ER positive, PR negative, and HER2 positive with a Ki67 of 80%.   ? Patient Stated Goals See if my arm is doing ok   ? Currently in Pain? No/denies   ? Pain Score 0-No pain   ? ?  ?  ? ?  ? ? ? ? ? ? ? ? ? ? ? ? ? ? ? ? ? ? ? ? [facility] Adult PT Treatment/Exercise - 06/22/21 0001   ? ?  ? Shoulder Exercises: Standing  ? Extension Strengthening;Both;10 reps   ? Theraband Level (Shoulder Extension) Level 1 (Yellow)   ? Retraction Strengthening;Both;10 reps   ? Theraband Level (Shoulder Retraction) Level 1 (Yellow)   ? Other Standing Exercises standing shoulder flexion and scaption x 10 and abd x 10   ?  ? Manual Therapy  ? Myofascial Release To Rt axilla, antecubital fossa  and upper arm  at areas of cording with longitudinal and S technique   ? Passive ROM In Supine to Rt shoulder into flexion, abduction, ER   ? ?  ?  ? ?  ? ? ? ? ? ? ? ? ? ? ? ? ? ? ? PT Long Term Goals - 06/01/21 1109   ? ?  ? PT LONG TERM GOAL #1  ? Title Patient will demonstrate she has regained full  shoulder ROM and function post operatively compared to baselines.   ? Time 8   ? Period Weeks   ? Status Achieved   ? Target Date 05/17/21   ?  ? PT LONG TERM GOAL #2  ? Title Patient will report >/= 50% less c/o tightness at end ROM right shoulder.   ? Time 4   ? Period Weeks   ? Status Achieved   ? Target Date 06/01/21   ?  ? PT LONG TERM GOAL #3  ? Title Patient will demonstrate >/= 50% reduction in visible cording (can compare photo under media tab)   ? Time 4   ? Period Weeks   ? Status Achieved   ? Target Date 06/01/21   ?  ? PT LONG TERM GOAL #4  ? Title Pt will be ind with final HEP for continued stretching   ? Time 3   ? Period Weeks   ? Status New   ? Target Date 06/15/21   ? ?  ?  ? ?  ? ? ? ? ? ? ? ? Plan - 06/22/21 1353   ? ? Clinical Impression Statement Continued MFR with longitudinal and S techniques throughout right UE. 1 small pop noted. also performed PROM to the right shoulder.  Pts ROM was  excllent after treatment and pt was moving very freely.  Cording is still visible and palpable and does tighten up.  Her husband has been working with her at home and she does feel this has helped.   ? Stability/Clinical Decision Making Stable/Uncomplicated   ? Rehab Potential Excellent   ? PT Frequency 1x / week   ? PT Duration 4 weeks   ? PT Treatment/Interventions ADLs/Self Care Home Management;Therapeutic exercise;Patient/family education;Manual techniques;Manual lymph drainage;Passive range of motion   ? PT Next Visit Plan Recert/ DC if ready or may continue 1x/week Will call if she feels she needs to come in before then, check cording, continue MFR, review exs   ? PT Home Exercise Plan Post op HEP and neural stretching, supine scap series   ? Consulted and Agree with Plan of Care Patient   ? ?  ?  ? ?  ? ? ?Patient will benefit from skilled therapeutic intervention in order to improve the following deficits and impairments:  Postural dysfunction, Decreased range of motion, Decreased knowledge of precautions, Impaired UE functional use, Pain, Increased fascial restricitons ? ?Visit Diagnosis: ?Pain in right arm ? ?Aftercare following surgery for neoplasm ? ?Abnormal posture ? ?Malignant neoplasm of upper-outer quadrant of right breast in female, estrogen receptor positive ([place]) ? ? ? ? ?Problem List ?Patient Active Problem List  ? Diagnosis Date Noted  ? Port-A-Cath in place 05/18/2021  ? Genetic testing 04/03/2021  ? Family history of breast cancer 03/23/2021  ? Malignant neoplasm of upper-outer quadrant of right breast in female, estrogen receptor positive ([place]) 03/20/2021  ? Osteoporosis 12/10/2018  ? ? ?Claris Pong, PT ?06/22/2021, 1:55 PM ? ?[facility] ?[facility] [place] ?[place], [place], [address] ?Phone: [phone]   Fax:  [phone] ? ?Name: Susan Reid ?MRN: [id number] ?Date of Birth: Sep 15, 1965 ? ? ? ?

## 2021-06-23 ENCOUNTER — Inpatient Hospital Stay: Payer: Managed Care, Other (non HMO) | Attending: Hematology and Oncology

## 2021-06-23 ENCOUNTER — Inpatient Hospital Stay: Payer: Managed Care, Other (non HMO)

## 2021-06-23 VITALS — BP 128/71 | HR 75 | Temp 98.1°F | Resp 16 | Wt 124.8 lb

## 2021-06-23 DIAGNOSIS — Z170 Estrogen receptor positive status [ER+]: Secondary | ICD-10-CM

## 2021-06-23 DIAGNOSIS — C50411 Malignant neoplasm of upper-outer quadrant of right female breast: Secondary | ICD-10-CM | POA: Insufficient documentation

## 2021-06-23 DIAGNOSIS — Z5111 Encounter for antineoplastic chemotherapy: Secondary | ICD-10-CM | POA: Insufficient documentation

## 2021-06-23 DIAGNOSIS — Z95828 Presence of other vascular implants and grafts: Secondary | ICD-10-CM

## 2021-06-23 DIAGNOSIS — Z5112 Encounter for antineoplastic immunotherapy: Secondary | ICD-10-CM | POA: Insufficient documentation

## 2021-06-23 LAB — CMP (CANCER CENTER ONLY)
ALT: 27 U/L (ref 0–44)
AST: 20 U/L (ref 15–41)
Albumin: 4.3 g/dL (ref 3.5–5.0)
Alkaline Phosphatase: 80 U/L (ref 38–126)
Anion gap: 4 — ABNORMAL LOW (ref 5–15)
BUN: 18 mg/dL (ref 6–20)
CO2: 31 mmol/L (ref 22–32)
Calcium: 9.6 mg/dL (ref 8.9–10.3)
Chloride: 105 mmol/L (ref 98–111)
Creatinine: 0.74 mg/dL (ref 0.44–1.00)
GFR, Estimated: >60 mL/min (ref >60)
Glucose, Bld: 83 mg/dL (ref 70–99)
Potassium: 4.3 mmol/L (ref 3.5–5.1)
Sodium: 140 mmol/L (ref 135–145)
Total Bilirubin: 0.3 mg/dL (ref 0.3–1.2)
Total Protein: 6.8 g/dL (ref 6.5–8.1)

## 2021-06-23 LAB — CBC WITH DIFFERENTIAL (CANCER CENTER ONLY)
Abs Immature Granulocytes: 0.02 10*3/uL (ref 0.00–0.07)
Basophils Absolute: 0 10*3/uL (ref 0.0–0.1)
Basophils Relative: 1 %
Eosinophils Absolute: 0.1 10*3/uL (ref 0.0–0.5)
Eosinophils Relative: 3 %
HCT: 36.9 % (ref 36.0–46.0)
Hemoglobin: 12.2 g/dL (ref 12.0–15.0)
Immature Granulocytes: 1 %
Lymphocytes Relative: 38 %
Lymphs Abs: 1.5 10*3/uL (ref 0.7–4.0)
MCH: 30.6 pg (ref 26.0–34.0)
MCHC: 33.1 g/dL (ref 30.0–36.0)
MCV: 92.5 fL (ref 80.0–100.0)
Monocytes Absolute: 0.3 10*3/uL (ref 0.1–1.0)
Monocytes Relative: 7 %
Neutro Abs: 2.1 10*3/uL (ref 1.7–7.7)
Neutrophils Relative %: 50 %
Platelet Count: 301 10*3/uL (ref 150–400)
RBC: 3.99 MIL/uL (ref 3.87–5.11)
RDW: 14.3 % (ref 11.5–15.5)
WBC Count: 4 10*3/uL (ref 4.0–10.5)
nRBC: 0 % (ref 0.0–0.2)

## 2021-06-23 MED ORDER — SODIUM CHLORIDE 0.9% FLUSH
10.0000 mL | Freq: Once | INTRAVENOUS | Status: AC
  Administered 2021-06-23: 10 mL

## 2021-06-23 MED ORDER — FAMOTIDINE IN NACL 20-0.9 MG/50ML-% IV SOLN
20.0000 mg | Freq: Once | INTRAVENOUS | Status: AC
  Administered 2021-06-23: 20 mg via INTRAVENOUS
  Filled 2021-06-23: qty 50

## 2021-06-23 MED ORDER — DIPHENHYDRAMINE HCL 50 MG/ML IJ SOLN
12.5000 mg | Freq: Once | INTRAMUSCULAR | Status: AC
  Administered 2021-06-23: 12.5 mg via INTRAVENOUS
  Filled 2021-06-23: qty 1

## 2021-06-23 MED ORDER — HEPARIN SOD (PORK) LOCK FLUSH 100 UNIT/ML IV SOLN
500.0000 [IU] | Freq: Once | INTRAVENOUS | Status: AC | PRN
  Administered 2021-06-23: 500 [IU]

## 2021-06-23 MED ORDER — SODIUM CHLORIDE 0.9 % IV SOLN
10.0000 mg | Freq: Once | INTRAVENOUS | Status: AC
  Administered 2021-06-23: 10 mg via INTRAVENOUS
  Filled 2021-06-23: qty 10

## 2021-06-23 MED ORDER — SODIUM CHLORIDE 0.9 % IV SOLN: Freq: Once | INTRAVENOUS | Status: AC

## 2021-06-23 MED ORDER — TRASTUZUMAB-ANNS CHEMO 150 MG IV SOLR
2.0000 mg/kg | Freq: Once | INTRAVENOUS | Status: AC
  Administered 2021-06-23: 105 mg via INTRAVENOUS
  Filled 2021-06-23: qty 5

## 2021-06-23 MED ORDER — SODIUM CHLORIDE 0.9% FLUSH
10.0000 mL | INTRAVENOUS | Status: DC | PRN
  Administered 2021-06-23: 10 mL

## 2021-06-23 MED ORDER — SODIUM CHLORIDE 0.9 % IV SOLN
80.0000 mg/m2 | Freq: Once | INTRAVENOUS | Status: AC
  Administered 2021-06-23: 126 mg via INTRAVENOUS
  Filled 2021-06-23: qty 21

## 2021-06-23 MED ORDER — ACETAMINOPHEN 325 MG PO TABS
650.0000 mg | ORAL_TABLET | Freq: Once | ORAL | Status: AC
  Administered 2021-06-23: 650 mg via ORAL
  Filled 2021-06-23: qty 2

## 2021-06-23 NOTE — Patient Instructions (Signed)
Susan Reid  Discharge Instructions: ?Thank you for choosing [facility] to provide your oncology and hematology care.  ? ?If you have a lab appointment with the [facility], please go directly to the [facility] and check in at the registration area. ?  ?Wear comfortable clothing and clothing appropriate for easy access to any Portacath or PICC line.  ? ?We strive to give you quality time with your provider. You may need to reschedule your appointment if you arrive late (15 or more minutes).  Arriving late affects you and other patients whose appointments are after yours.  Also, if you miss three or more appointments without notifying the office, you may be dismissed from the clinic at the provider?s discretion.    ?  ?For prescription refill requests, have your pharmacy contact our office and allow 72 hours for refills to be completed.   ? ?Today you received the following chemotherapy and/or immunotherapy agents: Trastuzumab and Paclitaxel     ?  ?To help prevent nausea and vomiting after your treatment, we encourage you to take your nausea medication as directed. ? ?BELOW ARE SYMPTOMS THAT SHOULD BE REPORTED IMMEDIATELY: ?*FEVER GREATER THAN 100.4 F (38 ?C) OR HIGHER ?*CHILLS OR SWEATING ?*NAUSEA AND VOMITING THAT IS NOT CONTROLLED WITH YOUR NAUSEA MEDICATION ?*UNUSUAL SHORTNESS OF BREATH ?*UNUSUAL BRUISING OR BLEEDING ?*URINARY PROBLEMS (pain or burning when urinating, or frequent urination) ?*BOWEL PROBLEMS (unusual diarrhea, constipation, pain near the anus) ?TENDERNESS IN MOUTH AND THROAT WITH OR WITHOUT PRESENCE OF ULCERS (sore throat, sores in mouth, or a toothache) ?UNUSUAL RASH, SWELLING OR PAIN  ?UNUSUAL VAGINAL DISCHARGE OR ITCHING  ? ?Items with * indicate a potential emergency and should be followed up as soon as possible or go to the Emergency Department if any problems should occur. ? ?Please show the CHEMOTHERAPY ALERT CARD or IMMUNOTHERAPY ALERT  CARD at check-in to the Emergency Department and triage nurse. ? ?Should you have questions after your visit or need to cancel or reschedule your appointment, please contact [facility]  Dept: [phone]  and follow the prompts.  Office hours are 8:00 a.m. to 4:30 p.m. Monday - Friday. Please note that voicemails left after 4:00 p.m. may not be returned until the following business day.  We are closed weekends and major holidays. You have access to a nurse at all times for urgent questions. Please call the main number to the clinic Dept: [phone] and follow the prompts. ? ? ?For any non-urgent questions, you may also contact your provider using MyChart. We now offer e-Visits for anyone 5 and older to request care online for non-urgent symptoms. For details visit [email or web address]. ?  ?Also download the MyChart app! Go to the app store, search "MyChart", open the app, select [facility], and log in with your MyChart username and password. ? ?Due to Covid, a mask is required upon entering the hospital/clinic. If you do not have a mask, one will be given to you upon arrival. For doctor visits, patients may have 1 support person aged 67 or older with them. For treatment visits, patients cannot have anyone with them due to current Covid guidelines and our immunocompromised population.  ? ?

## 2021-06-29 ENCOUNTER — Ambulatory Visit: Payer: Managed Care, Other (non HMO)

## 2021-06-29 ENCOUNTER — Other Ambulatory Visit: Payer: Self-pay

## 2021-06-29 DIAGNOSIS — Z170 Estrogen receptor positive status [ER+]: Secondary | ICD-10-CM

## 2021-06-29 DIAGNOSIS — R293 Abnormal posture: Secondary | ICD-10-CM

## 2021-06-29 DIAGNOSIS — M79601 Pain in right arm: Secondary | ICD-10-CM

## 2021-06-29 DIAGNOSIS — Z483 Aftercare following surgery for neoplasm: Secondary | ICD-10-CM

## 2021-06-29 MED FILL — Dexamethasone Sodium Phosphate Inj 100 MG/10ML: INTRAMUSCULAR | Qty: 1 | Status: AC

## 2021-06-29 NOTE — Therapy (Signed)
[facility] ?[place], [place], [address] ?Phone: [phone]   Fax:  [phone] ? ?Physical Therapy Treatment ? ?Patient Details  ?Name: Susan Reid ?MRN: [id number] ?Date of Birth: [birth year] ?Referring Provider (PT): Dr. Stark Klein ? ? ?Encounter Date: 06/29/2021 ? ? PT End of Session - 06/29/21 1405   ? ? Visit Number 11   ? Number of Visits 15   ? Date for PT Re-Evaluation 07/27/21   ? PT Start Time 1406   ? PT Stop Time 4315   ? PT Time Calculation (min) 43 min   ? Activity Tolerance Patient tolerated treatment well   ? Behavior During Therapy [facility] for tasks assessed/performed   ? ?  ?  ? ?  ? ? ?Past Medical History:  ?Diagnosis Date  ? Allergy   ? Family history of adverse reaction to anesthesia   ? Mother got severe N/V and pneumonia following anesthesia  ? Osteopenia   ? ? ?Past Surgical History:  ?Procedure Laterality Date  ? AXILLARY SENTINEL NODE BIOPSY Right 04/20/2021  ? Procedure: RIGHT AXILLARY SENTINEL NODE BIOPSY;  Surgeon: Stark Klein, MD;  Location: [facility];  Service: General;  Laterality: Right;  ? BREAST BIOPSY Right 02/25/2020  ? [place]  ? BREAST BIOPSY Right 03/14/2021  ? BREAST LUMPECTOMY WITH SENTINEL LYMPH NODE BIOPSY Right 04/20/2021  ? Procedure: RIGHT BREAST LUMPECTOMY;  Surgeon: Stark Klein, MD;  Location: [facility];  Service: General;  Laterality: Right;  ? PORTACATH PLACEMENT Left 04/20/2021  ? Procedure: INSERTION PORT-A-CATH;  Surgeon: Stark Klein, MD;  Location: [facility];  Service: General;  Laterality: Left;  ? WISDOM TOOTH EXTRACTION  04/24/1987  ? ? ?There were no vitals filed for this visit. ? ? Subjective Assessment - 06/29/21 1406   ? ? Subjective Things are going so much better.  My husband is still helping me every day.  I actually did some yard work with the sleeve and it was OK. The pull is really in my armpit only   ? 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 She had a right lumpectomy and senitnel node biopsy (5 negative nodes) on 04/20/2021. It is ER positive, PR negative, and HER2 positive with a Ki67 of 80%.   ? Currently in Pain? No/denies   ? Pain Score 0-No pain   ? ?  ?  ? ?  ? ? ? ? ? [facility] PT Assessment - 06/29/21 0001   ? ?  ? AROM  ? Right Shoulder Extension 69 Degrees   ? Right Shoulder Flexion 157 Degrees   ? Right Shoulder ABduction 174 Degrees   ? Right Shoulder Internal Rotation 72 Degrees   ? Right Shoulder External Rotation 105 Degrees   ? ?  ?  ? ?  ? ? ? ? ? ? ? ? ? ? ? ? ? ? ? ? [facility] Adult PT Treatment/Exercise - 06/29/21 0001   ? ?  ? Manual Therapy  ? Myofascial Release To Rt axilla, antecubital fossa  and upper arm during at areas of cording with longitudinal and S technique   ? Passive ROM In Supine to Rt shoulder into flexion, abduction, ER   ? ?  ?  ? ?  ? ? ? ? ? ? ? ? ? ? ? ? ? ? ? PT Long Term Goals - 06/29/21 1409   ? ?  ? PT  LONG TERM GOAL #1  ? Title Patient will demonstrate she has regained full shoulder ROM and function post operatively compared to baselines.   ? Time 8   ? Period Weeks   ? Status Achieved   ? Target Date 05/17/21   ?  ? PT LONG TERM GOAL #2  ? Title Patient will report >/= 50% less c/o tightness at end ROM right shoulder.   ? Time 4   ? Period Weeks   ? Status Achieved   ?  ? PT LONG TERM GOAL #3  ? Title Patient will demonstrate >/= 50% reduction in visible cording (can compare photo under media tab)   ? Time 4   ? Period Weeks   ? Status Achieved   ? Target Date 06/01/21   ?  ? PT LONG TERM GOAL #4  ? Title Pt will be ind with final HEP for continued stretching   ? Time 3   ? Period Weeks   ? Status On-going   ? Target Date 07/27/21   ? ?  ?  ? ?  ? ? ? ? ? ? ? ? Plan - 06/29/21 1453   ? ? Clinical Impression Statement Continued with MFR to right UE axilla and upper arm to decrease tension in cording.  No pops noted today, and cording although greatly improved is still visible.  Pts  has had a very good week and may be ready for release next week.   She will schedule 1 more visit in 2-3 weeks for reassessment.   ? Stability/Clinical Decision Making Stable/Uncomplicated   ? Rehab Potential Excellent   ? PT Frequency 1x / week   ? PT Duration 4 weeks   ? PT Treatment/Interventions ADLs/Self Care Home Management;Therapeutic exercise;Patient/family education;Manual techniques;Manual lymph drainage;Passive range of motion   ? PT Next Visit Plan pt to come 1 x in 2-3 weeks for DC if ready or may continue 1x/week Will call if she feels she needs to come in before then, check cording, continue MFR, review exs   ? PT Home Exercise Plan Post op HEP and neural stretching, supine scap series   ? Consulted and Agree with Plan of Care Patient   ? ?  ?  ? ?  ? ? ?Patient will benefit from skilled therapeutic intervention in order to improve the following deficits and impairments:  Postural dysfunction, Decreased range of motion, Decreased knowledge of precautions, Impaired UE functional use, Pain, Increased fascial restricitons ? ?Visit Diagnosis: ?Pain in right arm ? ?Aftercare following surgery for neoplasm ? ?Abnormal posture ? ?Malignant neoplasm of upper-outer quadrant of right breast in female, estrogen receptor positive ([place]) ? ? ? ? ?Problem List ?Patient Active Problem List  ? Diagnosis Date Noted  ? Port-A-Cath in place 05/18/2021  ? Genetic testing 04/03/2021  ? Family history of breast cancer 03/23/2021  ? Malignant neoplasm of upper-outer quadrant of right breast in female, estrogen receptor positive ([place]) 03/20/2021  ? Osteoporosis 12/10/2018  ? ? ?Claris Pong, PT ?06/29/2021, 2:55 PM ? ? ?[facility] ?[place], [place], [address] ?Phone: [phone]   Fax:  [phone] ? ?Name: Susan Reid ?MRN: [id number] ?Date of Birth: Dec 20, 1965 ? ? ? ?

## 2021-06-30 ENCOUNTER — Encounter: Payer: Self-pay | Admitting: Adult Health

## 2021-06-30 ENCOUNTER — Inpatient Hospital Stay: Payer: Managed Care, Other (non HMO)

## 2021-06-30 ENCOUNTER — Inpatient Hospital Stay: Payer: Managed Care, Other (non HMO) | Admitting: Adult Health

## 2021-06-30 ENCOUNTER — Inpatient Hospital Stay: Payer: Managed Care, Other (non HMO) | Admitting: Hematology and Oncology

## 2021-06-30 ENCOUNTER — Encounter: Payer: Self-pay | Admitting: *Deleted

## 2021-06-30 VITALS — BP 121/62 | HR 83 | Temp 97.6°F | Resp 16 | Ht 65.0 in | Wt 127.1 lb

## 2021-06-30 DIAGNOSIS — Z5112 Encounter for antineoplastic immunotherapy: Secondary | ICD-10-CM | POA: Diagnosis not present

## 2021-06-30 DIAGNOSIS — C50411 Malignant neoplasm of upper-outer quadrant of right female breast: Secondary | ICD-10-CM

## 2021-06-30 DIAGNOSIS — Z95828 Presence of other vascular implants and grafts: Secondary | ICD-10-CM

## 2021-06-30 DIAGNOSIS — Z170 Estrogen receptor positive status [ER+]: Secondary | ICD-10-CM | POA: Diagnosis not present

## 2021-06-30 LAB — CMP (CANCER CENTER ONLY)
ALT: 29 U/L (ref 0–44)
AST: 21 U/L (ref 15–41)
Albumin: 4.2 g/dL (ref 3.5–5.0)
Alkaline Phosphatase: 77 U/L (ref 38–126)
Anion gap: 5 (ref 5–15)
BUN: 19 mg/dL (ref 6–20)
CO2: 29 mmol/L (ref 22–32)
Calcium: 9.4 mg/dL (ref 8.9–10.3)
Chloride: 107 mmol/L (ref 98–111)
Creatinine: 0.69 mg/dL (ref 0.44–1.00)
GFR, Estimated: >60 mL/min (ref >60)
Glucose, Bld: 93 mg/dL (ref 70–99)
Potassium: 4.1 mmol/L (ref 3.5–5.1)
Sodium: 141 mmol/L (ref 135–145)
Total Bilirubin: 0.4 mg/dL (ref 0.3–1.2)
Total Protein: 6.3 g/dL — ABNORMAL LOW (ref 6.5–8.1)

## 2021-06-30 LAB — CBC WITH DIFFERENTIAL (CANCER CENTER ONLY)
Abs Immature Granulocytes: 0.01 10*3/uL (ref 0.00–0.07)
Basophils Absolute: 0 10*3/uL (ref 0.0–0.1)
Basophils Relative: 1 %
Eosinophils Absolute: 0.1 10*3/uL (ref 0.0–0.5)
Eosinophils Relative: 3 %
HCT: 36.3 % (ref 36.0–46.0)
Hemoglobin: 11.8 g/dL — ABNORMAL LOW (ref 12.0–15.0)
Immature Granulocytes: 0 %
Lymphocytes Relative: 40 %
Lymphs Abs: 1.4 10*3/uL (ref 0.7–4.0)
MCH: 30.4 pg (ref 26.0–34.0)
MCHC: 32.5 g/dL (ref 30.0–36.0)
MCV: 93.6 fL (ref 80.0–100.0)
Monocytes Absolute: 0.3 10*3/uL (ref 0.1–1.0)
Monocytes Relative: 8 %
Neutro Abs: 1.7 10*3/uL (ref 1.7–7.7)
Neutrophils Relative %: 48 %
Platelet Count: 286 10*3/uL (ref 150–400)
RBC: 3.88 MIL/uL (ref 3.87–5.11)
RDW: 14.6 % (ref 11.5–15.5)
WBC Count: 3.5 10*3/uL — ABNORMAL LOW (ref 4.0–10.5)
nRBC: 0 % (ref 0.0–0.2)

## 2021-06-30 MED ORDER — FAMOTIDINE IN NACL 20-0.9 MG/50ML-% IV SOLN
20.0000 mg | Freq: Once | INTRAVENOUS | Status: AC
  Administered 2021-06-30: 20 mg via INTRAVENOUS
  Filled 2021-06-30: qty 50

## 2021-06-30 MED ORDER — HEPARIN SOD (PORK) LOCK FLUSH 100 UNIT/ML IV SOLN
500.0000 [IU] | Freq: Once | INTRAVENOUS | Status: AC | PRN
  Administered 2021-06-30: 500 [IU]

## 2021-06-30 MED ORDER — DIPHENHYDRAMINE HCL 50 MG/ML IJ SOLN
12.5000 mg | Freq: Once | INTRAMUSCULAR | Status: AC
  Administered 2021-06-30: 12.5 mg via INTRAVENOUS
  Filled 2021-06-30: qty 1

## 2021-06-30 MED ORDER — SODIUM CHLORIDE 0.9 % IV SOLN: Freq: Once | INTRAVENOUS | Status: AC

## 2021-06-30 MED ORDER — SODIUM CHLORIDE 0.9% FLUSH
10.0000 mL | Freq: Once | INTRAVENOUS | Status: AC
  Administered 2021-06-30: 10 mL

## 2021-06-30 MED ORDER — TRASTUZUMAB-ANNS CHEMO 150 MG IV SOLR
2.0000 mg/kg | Freq: Once | INTRAVENOUS | Status: AC
  Administered 2021-06-30: 105 mg via INTRAVENOUS
  Filled 2021-06-30: qty 5

## 2021-06-30 MED ORDER — SODIUM CHLORIDE 0.9% FLUSH
10.0000 mL | INTRAVENOUS | Status: DC | PRN
  Administered 2021-06-30: 10 mL

## 2021-06-30 MED ORDER — SODIUM CHLORIDE 0.9 % IV SOLN
10.0000 mg | Freq: Once | INTRAVENOUS | Status: AC
  Administered 2021-06-30: 10 mg via INTRAVENOUS
  Filled 2021-06-30: qty 10

## 2021-06-30 MED ORDER — SODIUM CHLORIDE 0.9 % IV SOLN
80.0000 mg/m2 | Freq: Once | INTRAVENOUS | Status: AC
  Administered 2021-06-30: 126 mg via INTRAVENOUS
  Filled 2021-06-30: qty 21

## 2021-06-30 MED ORDER — ACETAMINOPHEN 325 MG PO TABS
650.0000 mg | ORAL_TABLET | Freq: Once | ORAL | Status: AC
  Administered 2021-06-30: 650 mg via ORAL
  Filled 2021-06-30: qty 2

## 2021-06-30 NOTE — Progress Notes (Signed)
[facility] Follow up:    Susan Noe, MD [place] [id number]   DIAGNOSIS:  Cancer Staging  Malignant neoplasm of upper-outer quadrant of right breast in female, estrogen receptor positive ([place]) Staging form: Breast, AJCC 8th Edition - Clinical stage from 03/22/2021: Stage IA (cT1b, cN0, cM0, G3, ER+, PR-, HER2+) - Signed by [name], MD on 03/22/2021 Stage prefix: Initial diagnosis Histologic grading system: 3 grade system - Pathologic stage from 04/20/2021: Stage IA (pT1c, pN0, cM0, G3, ER+, PR-, HER2+) - Signed by [name], NP on 06/30/2021 Stage prefix: Initial diagnosis Histologic grading system: 3 grade system   SUMMARY OF ONCOLOGIC HISTORY: Oncology History  Malignant neoplasm of upper-outer quadrant of right breast in female, estrogen receptor positive (Susan Reid)  03/14/2021 Initial Diagnosis   Palpable right breast mass diagnostic mammogram: 1.6 cm mass in the right breast at 9 o'clock. 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    03/22/2021 Cancer Staging   Staging form: Breast, AJCC 8th Edition - Clinical stage from 03/22/2021: Stage IA (cT1b, cN0, cM0, G3, ER+, PR-, HER2+) - Signed by [name], MD on 03/22/2021 Stage prefix: Initial diagnosis Histologic grading system: 3 grade system     Genetic Testing   [facility] CustomNext Panel was Negative. Report date is 04/03/2021.  The CustomNext-Cancer+RNAinsight panel offered by [facility] includes sequencing and rearrangement analysis for the following 47 genes:  APC, ATM, AXIN2, BARD1, BMPR1A, BRCA1, BRCA2, BRIP1, CDH1, CDK4, CDKN2A, CHEK2, CTNNA1, DICER1, EPCAM, GREM1, HOXB13, KIT, MEN1, MLH1, MSH2, MSH3, MSH6, MUTYH, NBN, NF1, NTHL1, PALB2, PDGFRA, PMS2, POLD1, POLE, PTEN, RAD50, RAD51C, RAD51D, SDHA, SDHB, SDHC, SDHD, SMAD4, SMARCA4, STK11, TP53, TSC1, TSC2, and VHL.  RNA data is routinely analyzed  for use in variant interpretation for all genes.   04/20/2021 Surgery   Rt Lumpectomy: Grade 3 IDC 1.9 cm 0/5 Ln neg, Margins Neg, Her2+, Copy #5.85, ratio 3.16 ER+(60%)/PR+(0%), Ki-67 80%   04/20/2021 Cancer Staging   Staging form: Breast, AJCC 8th Edition - Pathologic stage from 04/20/2021: Stage IA (pT1c, pN0, cM0, G3, ER+, PR-, HER2+) - Signed by [name], NP on 06/30/2021 Stage prefix: Initial diagnosis Histologic grading system: 3 grade system    05/18/2021 -  Chemotherapy   Patient is on Treatment Plan : BREAST Paclitaxel + Trastuzumab q7d / Trastuzumab q21d       CURRENT THERAPY: Taxol and Herceptin  INTERVAL HISTORY: [name] 56 y.o. female returns for evaluation prior to receiving adjuvant weekly Taxol and Herceptin.  She is due for week number 7 out of 12.  Her most recent echo was completed on April 05, 2021 and showed a normal ejection fraction of 59%.  Her global longitudinal strain was normal.  Her next echo is scheduled for July 10, 2021.  Jake tells me that she is tolerating her treatment well.  She says the main issue is mild fatigue on Mondays and feeling cold more so than usual.  She denies peripheral neuropathy or any other concerns.  Patient has a small lump in her right axilla she tells me that she has been seeing physical therapy for cording.  Patient Active Problem List   Diagnosis Date Noted   Port-A-Cath in place 05/18/2021   Genetic testing 04/03/2021   Family history of breast cancer 03/23/2021   Malignant neoplasm of upper-outer quadrant of right breast in female, estrogen receptor positive (Susan Reid) 03/20/2021   Osteoporosis  12/10/2018    is allergic to nitrates, organic.  MEDICAL HISTORY: Past Medical History:  Diagnosis Date   Allergy    Family history of adverse reaction to anesthesia    Mother got severe N/V and pneumonia following anesthesia   Osteopenia     SURGICAL HISTORY: Past Surgical History:  Procedure  Laterality Date   AXILLARY SENTINEL NODE BIOPSY Right 04/20/2021   Procedure: RIGHT AXILLARY SENTINEL NODE BIOPSY;  Surgeon: Stark Klein, MD;  Location: [facility];  Service: General;  Laterality: Right;   BREAST BIOPSY Right 02/25/2020   [place]   BREAST BIOPSY Right 03/14/2021   BREAST LUMPECTOMY WITH SENTINEL LYMPH NODE BIOPSY Right 04/20/2021   Procedure: RIGHT BREAST LUMPECTOMY;  Surgeon: Stark Klein, MD;  Location: [facility];  Service: General;  Laterality: Right;   PORTACATH PLACEMENT Left 04/20/2021   Procedure: INSERTION PORT-A-CATH;  Surgeon: Stark Klein, MD;  Location: [facility];  Service: General;  Laterality: Left;   WISDOM TOOTH EXTRACTION  04/24/1987    SOCIAL HISTORY: Social History   Socioeconomic History   Marital status: Married    Spouse name: Susan Reid   Number of children: 2   Years of education: High school   Highest education level: Not on file  Occupational History   Not on file  Tobacco Use   Smoking status: Never   Smokeless tobacco: Never  Vaping Use   Vaping Use: Never used  Substance and Sexual Activity   Alcohol use: Yes    Comment: one a month or less   Drug use: No   Sexual activity: Yes    Birth control/protection: Post-menopausal  Other Topics Concern   Not on file  Social History Narrative   10/28/19   From: the area   Living: with Husband, Susan Reid   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      Family: 2 children - Psychologist, educational (nurse at [employer]) and [name] (family medicine resident in [place]) - both married 2020 and 2021      Enjoys: work outside in the yard, hiking      Exercise: walking daily, was going to the gym   Diet: diabetic diet - avoiding added sugar      Safety   Seat belts: Yes    Guns: Yes  and secure   Safe in relationships: Yes    Social Determinants of Radio broadcast assistant Strain: Not on file  Food Insecurity: Not on file  Transportation Needs: Not on file  Physical Activity: Not on file  Stress: Not on  file  Social Connections: Not on file  Intimate Partner Violence: Not on file    FAMILY HISTORY: Family History  Problem Relation Age of Onset   Arthritis Mother    Hyperlipidemia Mother    Heart disease Mother    Stroke Mother 22   Hypertension Mother    Kidney disease Mother    Breast cancer Mother 6   Cirrhosis Mother    Mental illness Father    Depression Father    Lung cancer Maternal Aunt 43   Lung cancer Maternal Uncle 26   Cancer Maternal Grandfather        Oral  - smoker   Lung cancer Maternal Grandfather    Cancer Paternal Grandmother        unknown type   Prostate cancer Paternal Grandfather    Breast cancer Maternal Great-grandmother    Colon cancer Neg Hx    Colon polyps Neg Hx  Esophageal cancer Neg Hx    Rectal cancer Neg Hx    Stomach cancer Neg Hx     Review of Systems  Constitutional:  Positive for fatigue. Negative for appetite change, chills, fever and unexpected weight change.  HENT:   Negative for hearing loss, lump/mass and trouble swallowing.   Eyes:  Negative for eye problems and icterus.  Respiratory:  Negative for chest tightness, cough and shortness of breath.   Cardiovascular:  Negative for chest pain, leg swelling and palpitations.  Gastrointestinal:  Negative for abdominal distention, abdominal pain, constipation, diarrhea, nausea and vomiting.  Endocrine: Negative for hot flashes.  Genitourinary:  Negative for difficulty urinating.   Musculoskeletal:  Negative for arthralgias.  Skin:  Negative for itching and rash.  Neurological:  Negative for dizziness, extremity weakness, headaches and numbness.  Hematological:  Negative for adenopathy. Does not bruise/bleed easily.  Psychiatric/Behavioral:  Negative for depression. The patient is not nervous/anxious.      PHYSICAL EXAMINATION  ECOG PERFORMANCE STATUS: 1 - Symptomatic but completely ambulatory  Vitals:   06/30/21 1013  BP: 121/62  Pulse: 83  Resp: 16  Temp: 97.6 F (36.4  C)  SpO2: 100%    Physical Exam Constitutional:      General: She is not in acute distress.    Appearance: Normal appearance. She is not toxic-appearing.  HENT:     Head: Normocephalic and atraumatic.  Eyes:     General: No scleral icterus. Cardiovascular:     Rate and Rhythm: Normal rate and regular rhythm.     Pulses: Normal pulses.     Heart sounds: Normal heart sounds.  Pulmonary:     Effort: Pulmonary effort is normal.     Breath sounds: Normal breath sounds.  Chest:     Comments: Right axillary soft lump. Abdominal:     General: Abdomen is flat. Bowel sounds are normal. There is no distension.     Palpations: Abdomen is soft.     Tenderness: There is no abdominal tenderness.  Musculoskeletal:        General: No swelling.     Cervical back: Neck supple.  Lymphadenopathy:     Cervical: No cervical adenopathy.     Upper Body:     Right upper body: Axillary adenopathy (Approximately 2 cm in size) present.  Skin:    General: Skin is warm and dry.     Findings: No rash.  Neurological:     General: No focal deficit present.     Mental Status: She is alert.  Psychiatric:        Mood and Affect: Mood normal.        Behavior: Behavior normal.    LABORATORY DATA:  CBC    Component Value Date/Time   WBC 3.5 (L) 06/30/2021 0958   WBC 7.1 02/02/2021 1449   RBC 3.88 06/30/2021 0958   HGB 11.8 (L) 06/30/2021 0958   HCT 36.3 06/30/2021 0958   PLT 286 06/30/2021 0958   MCV 93.6 06/30/2021 0958   MCH 30.4 06/30/2021 0958   MCHC 32.5 06/30/2021 0958   RDW 14.6 06/30/2021 0958   LYMPHSABS 1.4 06/30/2021 0958   MONOABS 0.3 06/30/2021 0958   EOSABS 0.1 06/30/2021 0958   BASOSABS 0.0 06/30/2021 0958    CMP     Component Value Date/Time   NA 141 06/30/2021 0958   K 4.1 06/30/2021 0958   CL 107 06/30/2021 0958   CO2 29 06/30/2021 0958   GLUCOSE 93 06/30/2021 0958  BUN 19 06/30/2021 0958   CREATININE 0.69 06/30/2021 0958   CALCIUM 9.4 06/30/2021 0958   PROT 6.3  (L) 06/30/2021 0958   ALBUMIN 4.2 06/30/2021 0958   AST 21 06/30/2021 0958   ALT 29 06/30/2021 0958   ALKPHOS 77 06/30/2021 0958   BILITOT 0.4 06/30/2021 0958   GFRNONAA >60 06/30/2021 0958     ASSESSMENT and THERAPY PLAN:   Malignant neoplasm of upper-outer quadrant of right breast in female, estrogen receptor positive ([place]) 03/14/2021:Palpable right breast mass diagnostic mammogram: 1.6 cm mass in the right breast at 9 o'clock. 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  04/20/21: Rt Lumpectomy: Grade 3 IDC 0/5 Ln neg, Margins Neg, Her2+, Copy #5.85, ratio 3.16 ER+(60%)/PR+(0%), Ki-67 80%.   Treatment Plan: 1. adjuvant chemotherapy with Taxol Herceptin followed by Herceptin maintenance 2.  Adjuvant radiation therapy 3.  Adjuvant antiestrogen therapy --------------------------------------------------------------------------------------------------------------------------------------------------- Current treatment: Cycle 4 Taxol Herceptin Echocardiogram 04/05/2021: EF 59% Labs reviewed Chemo toxicities: Slight infusion reaction: Lasted for less than a few seconds where she had some chest pain and pelvic pain. 2.  Right axillary mass: This is soft and well-circumscribed.  She told me that physical therapy was thinking it was related to the cording.  I am not sure if Dr. Lindi Adie has palpated this however when she sees him in 2 weeks have put onto the appointment notes for him to follow-up.  We are monitoring her closely for peripheral neuropathy neuropathy which she has not experienced.  She is scheduled for echo on March 20.  She will return weekly for labs and Taxol/Herceptin, she will see an oncology provider with every other visit.  All questions were answered. The patient knows to call the clinic with any problems, questions or concerns. We can certainly see the patient much sooner if necessary.  Total encounter time: 20  minutes in face-to-face visit time, chart review, lab review, care coordination, order entry, and documentation of the encounter.  Wilber Bihari, NP 06/30/21 10:51 AM Medical Oncology and Hematology [facility] [place], [place] [address] Tel. [phone]    Fax. [phone]  *Total Encounter Time as defined by the Centers for Medicare and Medicaid Services includes, in addition to the face-to-face time of a patient visit (documented in the note above) non-face-to-face time: obtaining and reviewing outside history, ordering and reviewing medications, tests or procedures, care coordination (communications with other health care professionals or caregivers) and documentation in the medical record.

## 2021-06-30 NOTE — Assessment & Plan Note (Signed)
03/14/2021:Palpable right breast mass diagnostic mammogram: 1.6 cm mass in the right breast at 9 o'clock. Biopsy: Grade 2-3 invasive ductal carcinoma, Her2+, Copy #5.85, ratio 3.16 ER+(60%)/PR+(0%), Ki-67 80%.? ?04/20/21: Rt Lumpectomy: Grade 3 IDC 0/5 Ln neg, Margins Neg,?Her2+, Copy #5.85, ratio 3.16 ER+(60%)/PR+(0%), Ki-67 80%. ?? ?Treatment Plan: ?1.?adjuvant chemotherapy with Taxol Herceptin followed by Herceptin maintenance ?2.??Adjuvant radiation therapy ?3.??Adjuvant antiestrogen therapy ?--------------------------------------------------------------------------------------------------------------------------------------------------- ?Current treatment: Cycle 4 Taxol Herceptin ?Echocardiogram 04/05/2021: EF 59% ?Labs reviewed ?Chemo toxicities: ?1. Slight infusion reaction: Lasted for less than a few seconds where she had some chest pain and pelvic pain. ?2.  Right axillary mass: This is soft and well-circumscribed.  She told me that physical therapy was thinking it was related to the cording.  I am not sure if Dr. Lindi Adie has palpated this however when she sees him in 2 weeks have put onto the appointment notes for him to follow-up. ? ?We are monitoring her closely for peripheral neuropathy neuropathy which she has not experienced.  She is scheduled for echo on March 20. ? ?She will return weekly for labs and Taxol/Herceptin, she will see an oncology provider with every other visit. ?

## 2021-07-05 ENCOUNTER — Encounter: Payer: Self-pay | Admitting: *Deleted

## 2021-07-05 ENCOUNTER — Encounter: Payer: Self-pay | Admitting: Hematology and Oncology

## 2021-07-06 ENCOUNTER — Encounter: Payer: Self-pay | Admitting: Hematology and Oncology

## 2021-07-06 NOTE — Progress Notes (Signed)
Patient emailed copy of copay card for [place] for [place]. ? ?Card information entered in [employer]. Copy sent to [facility] for billing/claim submissions. Approval amount is $20,000 06/12/21 - 04/22/22 with lookback of 12/10/20 and will cover copay after insurance pays their portion. ?

## 2021-07-06 NOTE — Progress Notes (Signed)
Received message to connect with patient regarding copay assistance concern. ? ?Contacted patient to introduce myself as Arboriculturist and to listen to her concern. Patient states Christella Scheuermann did not receive correct codes needed for  copay assistance program. Asked patient whom applied on her behalf and she states she did because she was contacted by program stating she was eligible to apply. Advised patient I would have applied on her behalf and sharing information with Lenise whom submits claims on behalf of patients to copay foundations so that patients will not have to do this on their own. She verbalized understanding. ? ?Patient will email me a copy of what is needed I emailed her to provide my contact information to send email and for any additional financial questions or concerns. ? ?Patient also inquired about Cancer Policy paperwork she states was sent with original email concern. Sent message to [facility] for follow up. ? ? ?

## 2021-07-07 ENCOUNTER — Inpatient Hospital Stay: Payer: Managed Care, Other (non HMO)

## 2021-07-07 ENCOUNTER — Other Ambulatory Visit: Payer: Self-pay

## 2021-07-07 VITALS — BP 133/65 | HR 71 | Temp 98.3°F | Resp 18 | Ht 65.0 in | Wt 128.2 lb

## 2021-07-07 DIAGNOSIS — C50411 Malignant neoplasm of upper-outer quadrant of right female breast: Secondary | ICD-10-CM

## 2021-07-07 DIAGNOSIS — Z5112 Encounter for antineoplastic immunotherapy: Secondary | ICD-10-CM | POA: Diagnosis not present

## 2021-07-07 LAB — CMP (CANCER CENTER ONLY)
ALT: 31 U/L (ref 0–44)
AST: 24 U/L (ref 15–41)
Albumin: 4.1 g/dL (ref 3.5–5.0)
Alkaline Phosphatase: 73 U/L (ref 38–126)
Anion gap: 3 — ABNORMAL LOW (ref 5–15)
BUN: 15 mg/dL (ref 6–20)
CO2: 30 mmol/L (ref 22–32)
Calcium: 9.3 mg/dL (ref 8.9–10.3)
Chloride: 107 mmol/L (ref 98–111)
Creatinine: 0.68 mg/dL (ref 0.44–1.00)
GFR, Estimated: >60 mL/min (ref >60)
Glucose, Bld: 94 mg/dL (ref 70–99)
Potassium: 4.2 mmol/L (ref 3.5–5.1)
Sodium: 140 mmol/L (ref 135–145)
Total Bilirubin: 0.3 mg/dL (ref 0.3–1.2)
Total Protein: 6.5 g/dL (ref 6.5–8.1)

## 2021-07-07 LAB — CBC WITH DIFFERENTIAL (CANCER CENTER ONLY)
Abs Immature Granulocytes: 0.02 10*3/uL (ref 0.00–0.07)
Basophils Absolute: 0 10*3/uL (ref 0.0–0.1)
Basophils Relative: 1 %
Eosinophils Absolute: 0.1 10*3/uL (ref 0.0–0.5)
Eosinophils Relative: 3 %
HCT: 36.1 % (ref 36.0–46.0)
Hemoglobin: 11.6 g/dL — ABNORMAL LOW (ref 12.0–15.0)
Immature Granulocytes: 1 %
Lymphocytes Relative: 34 %
Lymphs Abs: 1.3 10*3/uL (ref 0.7–4.0)
MCH: 30.2 pg (ref 26.0–34.0)
MCHC: 32.1 g/dL (ref 30.0–36.0)
MCV: 94 fL (ref 80.0–100.0)
Monocytes Absolute: 0.4 10*3/uL (ref 0.1–1.0)
Monocytes Relative: 9 %
Neutro Abs: 2 10*3/uL (ref 1.7–7.7)
Neutrophils Relative %: 52 %
Platelet Count: 296 10*3/uL (ref 150–400)
RBC: 3.84 MIL/uL — ABNORMAL LOW (ref 3.87–5.11)
RDW: 14.6 % (ref 11.5–15.5)
WBC Count: 3.9 10*3/uL — ABNORMAL LOW (ref 4.0–10.5)
nRBC: 0 % (ref 0.0–0.2)

## 2021-07-07 MED ORDER — SODIUM CHLORIDE 0.9 % IV SOLN
80.0000 mg/m2 | Freq: Once | INTRAVENOUS | Status: AC
  Administered 2021-07-07: 126 mg via INTRAVENOUS
  Filled 2021-07-07: qty 21

## 2021-07-07 MED ORDER — FAMOTIDINE IN NACL 20-0.9 MG/50ML-% IV SOLN
20.0000 mg | Freq: Once | INTRAVENOUS | Status: AC
  Administered 2021-07-07: 20 mg via INTRAVENOUS
  Filled 2021-07-07: qty 50

## 2021-07-07 MED ORDER — SODIUM CHLORIDE 0.9% FLUSH
10.0000 mL | INTRAVENOUS | Status: DC | PRN
  Administered 2021-07-07: 10 mL

## 2021-07-07 MED ORDER — HEPARIN SOD (PORK) LOCK FLUSH 100 UNIT/ML IV SOLN
500.0000 [IU] | Freq: Once | INTRAVENOUS | Status: AC | PRN
  Administered 2021-07-07: 500 [IU]

## 2021-07-07 MED ORDER — SODIUM CHLORIDE 0.9 % IV SOLN: Freq: Once | INTRAVENOUS | Status: AC

## 2021-07-07 MED ORDER — DIPHENHYDRAMINE HCL 50 MG/ML IJ SOLN
12.5000 mg | Freq: Once | INTRAMUSCULAR | Status: AC
  Administered 2021-07-07: 12.5 mg via INTRAVENOUS
  Filled 2021-07-07: qty 1

## 2021-07-07 MED ORDER — SODIUM CHLORIDE 0.9 % IV SOLN
10.0000 mg | Freq: Once | INTRAVENOUS | Status: AC
  Administered 2021-07-07: 10 mg via INTRAVENOUS
  Filled 2021-07-07: qty 10

## 2021-07-07 MED ORDER — ACETAMINOPHEN 325 MG PO TABS
650.0000 mg | ORAL_TABLET | Freq: Once | ORAL | Status: AC
  Administered 2021-07-07: 650 mg via ORAL
  Filled 2021-07-07: qty 2

## 2021-07-07 MED ORDER — TRASTUZUMAB-ANNS CHEMO 150 MG IV SOLR
2.0000 mg/kg | Freq: Once | INTRAVENOUS | Status: AC
  Administered 2021-07-07: 105 mg via INTRAVENOUS
  Filled 2021-07-07: qty 5

## 2021-07-07 NOTE — Patient Instructions (Signed)
[facility]  Discharge Instructions: ?Thank you for choosing [facility] to provide your oncology and hematology care.  ? ?If you have a lab appointment with the [facility], please go directly to the [facility] and check in at the registration area. ?  ?Wear comfortable clothing and clothing appropriate for easy access to any Portacath or PICC line.  ? ?We strive to give you quality time with your provider. You may need to reschedule your appointment if you arrive late (15 or more minutes).  Arriving late affects you and other patients whose appointments are after yours.  Also, if you miss three or more appointments without notifying the office, you may be dismissed from the clinic at the provider?s discretion.    ?  ?For prescription refill requests, have your pharmacy contact our office and allow 72 hours for refills to be completed.   ? ?Today you received the following chemotherapy and/or immunotherapy agents herceptin, paclitaxel    ?  ?To help prevent nausea and vomiting after your treatment, we encourage you to take your nausea medication as directed. ? ?BELOW ARE SYMPTOMS THAT SHOULD BE REPORTED IMMEDIATELY: ?*FEVER GREATER THAN 100.4 F (38 ?C) OR HIGHER ?*CHILLS OR SWEATING ?*NAUSEA AND VOMITING THAT IS NOT CONTROLLED WITH YOUR NAUSEA MEDICATION ?*UNUSUAL SHORTNESS OF BREATH ?*UNUSUAL BRUISING OR BLEEDING ?*URINARY PROBLEMS (pain or burning when urinating, or frequent urination) ?*BOWEL PROBLEMS (unusual diarrhea, constipation, pain near the anus) ?TENDERNESS IN MOUTH AND THROAT WITH OR WITHOUT PRESENCE OF ULCERS (sore throat, sores in mouth, or a toothache) ?UNUSUAL RASH, SWELLING OR PAIN  ?UNUSUAL VAGINAL DISCHARGE OR ITCHING  ? ?Items with * indicate a potential emergency and should be followed up as soon as possible or go to the Emergency Department if any problems should occur. ? ?Please show the CHEMOTHERAPY ALERT CARD or IMMUNOTHERAPY ALERT CARD at  check-in to the Emergency Department and triage nurse. ? ?Should you have questions after your visit or need to cancel or reschedule your appointment, please contact [facility]  Dept: [phone]  and follow the prompts.  Office hours are 8:00 a.m. to 4:30 p.m. Monday - Friday. Please note that voicemails left after 4:00 p.m. may not be returned until the following business day.  We are closed weekends and major holidays. You have access to a nurse at all times for urgent questions. Please call the main number to the clinic Dept: [phone] and follow the prompts. ? ? ?For any non-urgent questions, you may also contact your provider using MyChart. We now offer e-Visits for anyone 5 and older to request care online for non-urgent symptoms. For details visit [email or web address]. ?  ?Also download the MyChart app! Go to the app store, search "MyChart", open the app, select [facility], and log in with your MyChart username and password. ? ?Due to Covid, a mask is required upon entering the hospital/clinic. If you do not have a mask, one will be given to you upon arrival. For doctor visits, patients may have 1 support person aged 101 or older with them. For treatment visits, patients cannot have anyone with them due to current Covid guidelines and our immunocompromised population.  ? ?

## 2021-07-10 ENCOUNTER — Ambulatory Visit (HOSPITAL_COMMUNITY)
Admission: RE | Admit: 2021-07-10 | Discharge: 2021-07-10 | Disposition: A | Discharge status: Home / Self Care | Payer: Managed Care, Other (non HMO) | Source: Ambulatory Visit | Attending: Hematology and Oncology | Admitting: Hematology and Oncology

## 2021-07-10 ENCOUNTER — Encounter: Payer: Self-pay | Admitting: Gastroenterology

## 2021-07-10 ENCOUNTER — Other Ambulatory Visit: Payer: Self-pay

## 2021-07-10 DIAGNOSIS — Z01818 Encounter for other preprocedural examination: Secondary | ICD-10-CM | POA: Diagnosis present

## 2021-07-10 DIAGNOSIS — M8580 Other specified disorders of bone density and structure, unspecified site: Secondary | ICD-10-CM | POA: Insufficient documentation

## 2021-07-10 DIAGNOSIS — Z0189 Encounter for other specified special examinations: Secondary | ICD-10-CM | POA: Diagnosis not present

## 2021-07-10 DIAGNOSIS — Z170 Estrogen receptor positive status [ER+]: Secondary | ICD-10-CM | POA: Diagnosis not present

## 2021-07-10 DIAGNOSIS — C50411 Malignant neoplasm of upper-outer quadrant of right female breast: Secondary | ICD-10-CM

## 2021-07-10 LAB — ECHOCARDIOGRAM COMPLETE
AR max vel: 2.16 cm2
AV Area VTI: 2.07 cm2
AV Area mean vel: 1.84 cm2
AV Mean grad: 4 mmHg
AV Peak grad: 6.3 mmHg
Ao pk vel: 1.25 m/s
Area-P 1/2: 3.85 cm2
Calc EF: 69.2 %
S' Lateral: 2.9 cm
Single Plane A2C EF: 74.8 %
Single Plane A4C EF: 64.7 %

## 2021-07-10 NOTE — Progress Notes (Signed)
? ?  Echocardiogram ?2D Echocardiogram has been performed. ? ?Susan Reid ?07/10/2021, 10:32 AM ?

## 2021-07-11 ENCOUNTER — Telehealth: Payer: Self-pay | Admitting: Hematology and Oncology

## 2021-07-11 NOTE — Telephone Encounter (Signed)
Rescheduled appointment per providers. Patient aware.  ? ?

## 2021-07-13 NOTE — Progress Notes (Signed)
? ?Patient Care Team: ?Lesleigh Noe, MD as PCP - General (Family Medicine) ?Druscilla Brownie, MD as Consulting Physician (Dermatology) ?Rockwell Germany, RN as Oncology Nurse Navigator ?Mauro Kaufmann, RN as Oncology Nurse Navigator ?Stark Klein, MD as Consulting Physician (General Surgery) ?[name], MD as Consulting Physician (Hematology and Oncology) ?Gery Pray, MD as Consulting Physician (Radiation Oncology) ? ?DIAGNOSIS:  ?Encounter Diagnosis  ?Name Primary?  ? Malignant neoplasm of upper-outer quadrant of right breast in female, estrogen receptor positive ([place])   ? ? ?SUMMARY OF ONCOLOGIC HISTORY: ?Oncology History  ?Malignant neoplasm of upper-outer quadrant of right breast in female, estrogen receptor positive ([place])  ?03/14/2021 Initial Diagnosis  ? Palpable right breast mass diagnostic mammogram: 1.6 cm mass in the right breast at 9 o'clock. 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  ?  ?03/22/2021 Cancer Staging  ? Staging form: Breast, AJCC 8th Edition ?- Clinical stage from 03/22/2021: Stage IA (cT1b, cN0, cM0, G3, ER+, PR-, HER2+) - Signed by [name], MD on 03/22/2021 ?Stage prefix: Initial diagnosis ?Histologic grading system: 3 grade system ? ?  ? Genetic Testing  ? [facility] CustomNext Panel was Negative. Report date is 04/03/2021. ? ?The CustomNext-Cancer+RNAinsight panel offered by [facility] includes sequencing and rearrangement analysis for the following 47 genes:  APC, ATM, AXIN2, BARD1, BMPR1A, BRCA1, BRCA2, BRIP1, CDH1, CDK4, CDKN2A, CHEK2, CTNNA1, DICER1, EPCAM, GREM1, HOXB13, KIT, MEN1, MLH1, MSH2, MSH3, MSH6, MUTYH, NBN, NF1, NTHL1, PALB2, PDGFRA, PMS2, POLD1, POLE, PTEN, RAD50, RAD51C, RAD51D, SDHA, SDHB, SDHC, SDHD, SMAD4, SMARCA4, STK11, TP53, TSC1, TSC2, and VHL.  RNA data is routinely analyzed for use in variant interpretation for all genes. ?  ?04/20/2021 Surgery  ? Rt Lumpectomy: Grade 3 IDC 1.9 cm  0/5 Ln neg, Margins Neg, Her2+, Copy #5.85, ratio 3.16 ER+(60%)/PR+(0%), Ki-67 80% ?  ?04/20/2021 Cancer Staging  ? Staging form: Breast, AJCC 8th Edition ?- Pathologic stage from 04/20/2021: Stage IA (pT1c, pN0, cM0, G3, ER+, PR-, HER2+) - Signed by [name], NP on 06/30/2021 ?Stage prefix: Initial diagnosis ?Histologic grading system: 3 grade system ? ?  ?05/18/2021 -  Chemotherapy  ? Patient is on Treatment Plan : BREAST Paclitaxel + Trastuzumab q7d / Trastuzumab q21d  ?   ? ? ?CHIEF COMPLIANT: Cycle 9 Taxol and Herceptin  ? ?INTERVAL HISTORY: Susan Reid is a 56 y.o. with above-mentioned history of  breast cancer, currently on chemotherapy on Paclitaxel + Trastuzumab. She presents to the clinic today for follow-up and treatment. She had some fatigue last weekend. She complains of her taste buds is off.  She denies any peripheral neuropathy.  Denies any nausea vomiting. ? ? ?ALLERGIES:  is allergic to nitrates, organic. ? ?MEDICATIONS:  ?Current Outpatient Medications  ?Medication Sig Dispense Refill  ? acetaminophen (TYLENOL) 325 MG tablet Take 1,000 mg by mouth every 6 (six) hours as needed.    ? Ascorbic Acid (VITAMIN C PO) Take 500 mg by mouth daily.    ? Calcium Citrate 1040 MG TABS Take 2,080 mg of elemental calcium by mouth daily.    ? cholecalciferol (VITAMIN D) 1000 UNITS tablet Take 1,000 Units by mouth daily.    ? fluticasone (FLONASE) 50 MCG/ACT nasal spray Place 1 spray into both nostrils daily as needed for allergies or rhinitis.    ? lidocaine-prilocaine (EMLA) cream Apply to affected area once 30 g 3  ? loratadine (CLARITIN) 10 MG tablet Take 10 mg by mouth daily as needed  for allergies.    ? LORazepam (ATIVAN) 0.5 MG tablet Take 1 tablet (0.5 mg total) by mouth at bedtime as needed (Nausea or vomiting). 30 tablet 0  ? Multiple Vitamin (MULTIVITAMIN) tablet Take 1 tablet by mouth daily.    ? ondansetron (ZOFRAN) 8 MG tablet Take 1 tablet (8 mg total) by mouth 2 (two) times daily as  needed (Nausea or vomiting). 30 tablet 1  ? prochlorperazine (COMPAZINE) 10 MG tablet Take 1 tablet (10 mg total) by mouth every 6 (six) hours as needed (Nausea or vomiting). 30 tablet 1  ? ?No current facility-administered medications for this visit.  ? ? ?PHYSICAL EXAMINATION: ?ECOG PERFORMANCE STATUS: 1 - Symptomatic but completely ambulatory ? ?Vitals:  ? 07/14/21 1109  ?BP: 121/71  ?Pulse: 80  ?Resp: 18  ?Temp: 97.8 ?F (36.6 ?C)  ?SpO2: 99%  ? ?Filed Weights  ? 07/14/21 1109  ?Weight: 128 lb (58.1 kg)  ? ?  ? ?LABORATORY DATA:  ?I have reviewed the data as listed ? ?  Latest Ref Rng & Units 07/14/2021  ? 10:49 AM 07/07/2021  ? 10:06 AM 06/30/2021  ?  9:58 AM  ?CMP  ?Glucose 70 - 99 mg/dL 94   94   93    ?BUN 6 - 20 mg/dL 19   15   19     ?Creatinine 0.44 - 1.00 mg/dL 0.73   0.68   0.69    ?Sodium 135 - 145 mmol/L 140   140   141    ?Potassium 3.5 - 5.1 mmol/L 4.1   4.2   4.1    ?Chloride 98 - 111 mmol/L 106   107   107    ?CO2 22 - 32 mmol/L 31   30   29     ?Calcium 8.9 - 10.3 mg/dL 9.3   9.3   9.4    ?Total Protein 6.5 - 8.1 g/dL 6.4   6.5   6.3    ?Total Bilirubin 0.3 - 1.2 mg/dL 0.4   0.3   0.4    ?Alkaline Phos 38 - 126 U/L 75   73   77    ?AST 15 - 41 U/L 20   24   21     ?ALT 0 - 44 U/L 27   31   29     ? ? ?Lab Results  ?Component Value Date  ? WBC 3.7 (L) 07/14/2021  ? HGB 11.5 (L) 07/14/2021  ? HCT 36.0 07/14/2021  ? MCV 93.8 07/14/2021  ? PLT 288 07/14/2021  ? NEUTROABS 2.0 07/14/2021  ? ? ?ASSESSMENT & PLAN:  ?Malignant neoplasm of upper-outer quadrant of right breast in female, estrogen receptor positive ([place]) ?03/14/2021:Palpable right breast mass diagnostic mammogram: 1.6 cm mass in the right breast at 9 o'clock. 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  ?04/20/21: Rt Lumpectomy: Grade 3 IDC 0/5 Ln neg, Margins Neg, Her2+, Copy #5.85, ratio 3.16 ER+(60%)/PR+(0%), Ki-67 80%. ?  ?Treatment Plan: ?1. adjuvant chemotherapy with Taxol Herceptin  followed by Herceptin maintenance ?2.  Adjuvant radiation therapy ?3.  Adjuvant antiestrogen therapy ?--------------------------------------------------------------------------------------------------------------------------------------------------- ?Current treatment: Cycle 9 Taxol Herceptin ?Echocardiogram 07/10/2021: EF 60 to 65% ?Labs reviewed ?Chemo toxicities: ?Slight infusion reaction: Lasted for less than a few seconds where she had some chest pain and pelvic pain. ?2.   slight lymphedema in the right arm: Patient is seeing physical therapy for this ?Denies any peripheral neuropathy. ?Complains of fatigue ? ?I requested radiation oncology reevaluation  along with her last cycle of chemotherapy. ?We will set her up for every 3-week Herceptin's after 12 rounds of chemo are complete. ?We discussed the role of [facility] blood testing ? ?Return to clinic weekly for chemo and every other week for follow-up with me ? ? ? ?No orders of the defined types were placed in this encounter. ? ?The patient has a good understanding of the overall plan. she agrees with it. she will call with any problems that may develop before the next visit here. ?Total time spent: 30 mins including face to face time and time spent for planning, charting and co-ordination of care ? ? Harriette Ohara, MD ?07/14/21 ? ? ? I Gardiner Coins am scribing for Dr. Lindi Adie ? ?I have reviewed the above documentation for accuracy and completeness, and I agree with the above. ?  ?

## 2021-07-14 ENCOUNTER — Inpatient Hospital Stay: Payer: Managed Care, Other (non HMO)

## 2021-07-14 ENCOUNTER — Inpatient Hospital Stay (HOSPITAL_BASED_OUTPATIENT_CLINIC_OR_DEPARTMENT_OTHER): Payer: Managed Care, Other (non HMO) | Admitting: Hematology and Oncology

## 2021-07-14 ENCOUNTER — Other Ambulatory Visit: Payer: Self-pay

## 2021-07-14 DIAGNOSIS — C50411 Malignant neoplasm of upper-outer quadrant of right female breast: Secondary | ICD-10-CM

## 2021-07-14 DIAGNOSIS — Z95828 Presence of other vascular implants and grafts: Secondary | ICD-10-CM

## 2021-07-14 DIAGNOSIS — Z170 Estrogen receptor positive status [ER+]: Secondary | ICD-10-CM | POA: Diagnosis not present

## 2021-07-14 DIAGNOSIS — Z5112 Encounter for antineoplastic immunotherapy: Secondary | ICD-10-CM | POA: Diagnosis not present

## 2021-07-14 LAB — CBC WITH DIFFERENTIAL (CANCER CENTER ONLY)
Abs Immature Granulocytes: 0.02 10*3/uL (ref 0.00–0.07)
Basophils Absolute: 0.1 10*3/uL (ref 0.0–0.1)
Basophils Relative: 1 %
Eosinophils Absolute: 0.1 10*3/uL (ref 0.0–0.5)
Eosinophils Relative: 4 %
HCT: 36 % (ref 36.0–46.0)
Hemoglobin: 11.5 g/dL — ABNORMAL LOW (ref 12.0–15.0)
Immature Granulocytes: 1 %
Lymphocytes Relative: 31 %
Lymphs Abs: 1.2 10*3/uL (ref 0.7–4.0)
MCH: 29.9 pg (ref 26.0–34.0)
MCHC: 31.9 g/dL (ref 30.0–36.0)
MCV: 93.8 fL (ref 80.0–100.0)
Monocytes Absolute: 0.4 10*3/uL (ref 0.1–1.0)
Monocytes Relative: 11 %
Neutro Abs: 2 10*3/uL (ref 1.7–7.7)
Neutrophils Relative %: 52 %
Platelet Count: 288 10*3/uL (ref 150–400)
RBC: 3.84 MIL/uL — ABNORMAL LOW (ref 3.87–5.11)
RDW: 14.7 % (ref 11.5–15.5)
WBC Count: 3.7 10*3/uL — ABNORMAL LOW (ref 4.0–10.5)
nRBC: 0 % (ref 0.0–0.2)

## 2021-07-14 LAB — CMP (CANCER CENTER ONLY)
ALT: 27 U/L (ref 0–44)
AST: 20 U/L (ref 15–41)
Albumin: 4 g/dL (ref 3.5–5.0)
Alkaline Phosphatase: 75 U/L (ref 38–126)
Anion gap: 3 — ABNORMAL LOW (ref 5–15)
BUN: 19 mg/dL (ref 6–20)
CO2: 31 mmol/L (ref 22–32)
Calcium: 9.3 mg/dL (ref 8.9–10.3)
Chloride: 106 mmol/L (ref 98–111)
Creatinine: 0.73 mg/dL (ref 0.44–1.00)
GFR, Estimated: >60 mL/min (ref >60)
Glucose, Bld: 94 mg/dL (ref 70–99)
Potassium: 4.1 mmol/L (ref 3.5–5.1)
Sodium: 140 mmol/L (ref 135–145)
Total Bilirubin: 0.4 mg/dL (ref 0.3–1.2)
Total Protein: 6.4 g/dL — ABNORMAL LOW (ref 6.5–8.1)

## 2021-07-14 MED ORDER — SODIUM CHLORIDE 0.9 % IV SOLN: Freq: Once | INTRAVENOUS | Status: AC

## 2021-07-14 MED ORDER — SODIUM CHLORIDE 0.9 % IV SOLN
80.0000 mg/m2 | Freq: Once | INTRAVENOUS | Status: AC
  Administered 2021-07-14: 126 mg via INTRAVENOUS
  Filled 2021-07-14: qty 21

## 2021-07-14 MED ORDER — TRASTUZUMAB-ANNS CHEMO 150 MG IV SOLR
2.0000 mg/kg | Freq: Once | INTRAVENOUS | Status: AC
  Administered 2021-07-14: 105 mg via INTRAVENOUS
  Filled 2021-07-14: qty 5

## 2021-07-14 MED ORDER — SODIUM CHLORIDE 0.9 % IV SOLN
10.0000 mg | Freq: Once | INTRAVENOUS | Status: AC
  Administered 2021-07-14: 10 mg via INTRAVENOUS
  Filled 2021-07-14: qty 10

## 2021-07-14 MED ORDER — DIPHENHYDRAMINE HCL 50 MG/ML IJ SOLN
12.5000 mg | Freq: Once | INTRAMUSCULAR | Status: AC
  Administered 2021-07-14: 12.5 mg via INTRAVENOUS
  Filled 2021-07-14: qty 1

## 2021-07-14 MED ORDER — SODIUM CHLORIDE 0.9% FLUSH
10.0000 mL | Freq: Once | INTRAVENOUS | Status: AC
  Administered 2021-07-14: 10 mL

## 2021-07-14 MED ORDER — FAMOTIDINE IN NACL 20-0.9 MG/50ML-% IV SOLN
20.0000 mg | Freq: Once | INTRAVENOUS | Status: AC
  Administered 2021-07-14: 20 mg via INTRAVENOUS
  Filled 2021-07-14: qty 50

## 2021-07-14 MED ORDER — ACETAMINOPHEN 325 MG PO TABS
650.0000 mg | ORAL_TABLET | Freq: Once | ORAL | Status: AC
  Administered 2021-07-14: 650 mg via ORAL
  Filled 2021-07-14: qty 2

## 2021-07-14 NOTE — Assessment & Plan Note (Addendum)
03/14/2021:Palpable right breast mass diagnostic mammogram: 1.6 cm mass in the right breast at 9 o'clock. Biopsy: Grade 2-3 invasive ductal carcinoma, Her2+, Copy #5.85, ratio 3.16 ER+(60%)/PR+(0%), Ki-67 80%.? ?04/20/21: Rt Lumpectomy: Grade 3 IDC 0/5 Ln neg, Margins Neg,?Her2+, Copy #5.85, ratio 3.16 ER+(60%)/PR+(0%), Ki-67 80%. ?? ?Treatment Plan: ?1.?adjuvant chemotherapy with Taxol Herceptin followed by Herceptin maintenance ?2.??Adjuvant radiation therapy ?3.??Adjuvant antiestrogen therapy ?--------------------------------------------------------------------------------------------------------------------------------------------------- ?Current treatment: Cycle?9?Taxol Herceptin ?Echocardiogram 07/10/2021: EF 60 to 65% ?Labs reviewed ?Chemo toxicities: ?1. Slight infusion reaction: Lasted for less than a few seconds where she had some chest pain and pelvic pain. ?2.   slight lymphedema in the right arm: Patient is seeing physical therapy for this ?Denies any peripheral neuropathy. ?Complains of fatigue ? ?I requested radiation oncology reevaluation along with her last cycle of chemotherapy. ?We will set her up for every 3-week Herceptin's after 12 rounds of chemo are complete. ?We discussed the role of [facility] blood testing ? ?Return to clinic weekly for chemo and every other week for follow-up with me ?

## 2021-07-17 ENCOUNTER — Other Ambulatory Visit: Payer: Self-pay | Admitting: *Deleted

## 2021-07-17 ENCOUNTER — Other Ambulatory Visit: Payer: Self-pay

## 2021-07-17 ENCOUNTER — Ambulatory Visit: Payer: Managed Care, Other (non HMO)

## 2021-07-17 VITALS — Wt 128.0 lb

## 2021-07-17 DIAGNOSIS — Z483 Aftercare following surgery for neoplasm: Secondary | ICD-10-CM

## 2021-07-17 DIAGNOSIS — Z170 Estrogen receptor positive status [ER+]: Secondary | ICD-10-CM

## 2021-07-17 NOTE — Therapy (Signed)
?  OUTPATIENT PHYSICAL THERAPY SOZO SCREENING NOTE ? ? ?Patient Name: Susan Reid ?MRN: [id number] ?DOB:[birth year], 56 y.o., female ?Today's Date: 07/17/2021 ? ?PCP: Lesleigh Noe, MD ?REFERRING PROVIDER: [name], MD ? ? PT End of Session - 07/17/21 0957   ? ? Visit Number 11   # unchanged due to screen only  ? PT Start Time [phone]   ? PT Stop Time 0955   ? PT Time Calculation (min) 13 min   ? Activity Tolerance Patient tolerated treatment well   ? Behavior During Therapy [facility] for tasks assessed/performed   ? ?  ?  ? ?  ? ? ?Past Medical History:  ?Diagnosis Date  ? Allergy   ? Family history of adverse reaction to anesthesia   ? Mother got severe N/V and pneumonia following anesthesia  ? Osteopenia   ? ?Past Surgical History:  ?Procedure Laterality Date  ? AXILLARY SENTINEL NODE BIOPSY Right 04/20/2021  ? Procedure: RIGHT AXILLARY SENTINEL NODE BIOPSY;  Surgeon: Stark Klein, MD;  Location: [facility];  Service: General;  Laterality: Right;  ? BREAST BIOPSY Right 02/25/2020  ? [place]  ? BREAST BIOPSY Right 03/14/2021  ? BREAST LUMPECTOMY WITH SENTINEL LYMPH NODE BIOPSY Right 04/20/2021  ? Procedure: RIGHT BREAST LUMPECTOMY;  Surgeon: Stark Klein, MD;  Location: [facility];  Service: General;  Laterality: Right;  ? PORTACATH PLACEMENT Left 04/20/2021  ? Procedure: INSERTION PORT-A-CATH;  Surgeon: Stark Klein, MD;  Location: [facility];  Service: General;  Laterality: Left;  ? WISDOM TOOTH EXTRACTION  04/24/1987  ? ?Patient Active Problem List  ? Diagnosis Date Noted  ? Port-A-Cath in place 05/18/2021  ? Genetic testing 04/03/2021  ? Family history of breast cancer 03/23/2021  ? Malignant neoplasm of upper-outer quadrant of right breast in female, estrogen receptor positive ([place]) 03/20/2021  ? Osteoporosis 12/10/2018  ? ? ?REFERRING DIAG: right breast cancer at risk for lymphedema ? ?THERAPY DIAG:  ?Aftercare following surgery for neoplasm ? ?PERTINENT HISTORY: Patient was diagnosed on 03/07/2021 with right grade II-III  invasive ductal carcinoma breast cancer. She had a right lumpectomy and senitnel node biopsy (5 negative nodes) on 04/20/2021. It is ER positive, PR negative, and HER2 positive with a Ki67 of 80%.  ? ?PRECAUTIONS: right UE Lymphedema risk, None ? ?SUBJECTIVE: Pt returns for her 3 month L-Dex screen. "I just noticed my hand was a little swollen last week."  ? ?PAIN:  ?Are you having pain? No ? ?SOZO SCREENING: ? ?Patient was assessed today using the SOZO machine to determine the lymphedema index score. This was compared to her baseline score. It was determined that she is NOT within the recommended range when compared to her baseline. She has a compression sleeve and gauntlet and will begin wearing this tomorrow when she picks up her new gauntlet. It is recommended she return in 1 month to be reassessed. If she continues to measure outside the recommended range, physical therapy treatment will be recommended at that time and a referral requested. ? ?Plan: Pt is to return on 1 month for SOZO screen due to testing higher than normal range from baseline.  ? ?Otelia Limes, PTA ?07/17/2021, 9:57 AM ? ?  ? ?

## 2021-07-19 ENCOUNTER — Encounter: Payer: Self-pay | Admitting: Radiation Oncology

## 2021-07-20 ENCOUNTER — Ambulatory Visit: Payer: Managed Care, Other (non HMO)

## 2021-07-20 DIAGNOSIS — C50411 Malignant neoplasm of upper-outer quadrant of right female breast: Secondary | ICD-10-CM

## 2021-07-20 DIAGNOSIS — M79601 Pain in right arm: Secondary | ICD-10-CM | POA: Diagnosis not present

## 2021-07-20 DIAGNOSIS — Z483 Aftercare following surgery for neoplasm: Secondary | ICD-10-CM

## 2021-07-20 DIAGNOSIS — R293 Abnormal posture: Secondary | ICD-10-CM

## 2021-07-20 NOTE — Therapy (Signed)
[facility] ?[place], [place], [address] ?Phone: [phone]   Fax:  [phone] ? ?Physical Therapy Treatment ? ?Patient Details  ?Name: Susan Reid ?MRN: [id number] ?Date of Birth: 07/25/1965 ?Referring Provider (PT): Dr. Stark Klein ? ? ?Encounter Date: 07/20/2021 ? ? PT End of Session - 07/20/21 1407   ? ? Visit Number 12   ? Number of Visits 15   ? Date for PT Re-Evaluation 07/27/21   ? PT Start Time 1407   ? PT Stop Time 1500   ? PT Time Calculation (min) 53 min   ? Activity Tolerance Patient tolerated treatment well   ? Behavior During Therapy [facility] for tasks assessed/performed   ? ?  ?  ? ?  ? ? ?Past Medical History:  ?Diagnosis Date  ? Allergy   ? Family history of adverse reaction to anesthesia   ? Mother got severe N/V and pneumonia following anesthesia  ? Osteopenia   ? ? ?Past Surgical History:  ?Procedure Laterality Date  ? AXILLARY SENTINEL NODE BIOPSY Right 04/20/2021  ? Procedure: RIGHT AXILLARY SENTINEL NODE BIOPSY;  Surgeon: Stark Klein, MD;  Location: [facility];  Service: General;  Laterality: Right;  ? BREAST BIOPSY Right 02/25/2020  ? [place]  ? BREAST BIOPSY Right 03/14/2021  ? BREAST LUMPECTOMY WITH SENTINEL LYMPH NODE BIOPSY Right 04/20/2021  ? Procedure: RIGHT BREAST LUMPECTOMY;  Surgeon: Stark Klein, MD;  Location: [facility];  Service: General;  Laterality: Right;  ? PORTACATH PLACEMENT Left 04/20/2021  ? Procedure: INSERTION PORT-A-CATH;  Surgeon: Stark Klein, MD;  Location: [facility];  Service: General;  Laterality: Left;  ? WISDOM TOOTH EXTRACTION  04/24/1987  ? ? ?There were no vitals filed for this visit. ? ? Subjective Assessment - 07/20/21 1406   ? ? Subjective My hand started to swell on Friday after my chemo treatment and all the steroids. When I did the screen on Monday I was in the red. I have been wearing my sleeve and gauntlet and my hand is better. The cording is doing much better, and my husband still works on it   with me.   ? 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 She had a right lumpectomy and senitnel node biopsy (5 negative nodes) on 04/20/2021. It is ER positive, PR negative, and HER2 positive with a Ki67 of 80%.   ? ?  ?  ? ?  ? ? ? ? ? ? ? ? LYMPHEDEMA/ONCOLOGY QUESTIONNAIRE - 07/20/21 0001   ? ?  ? Type  ? Cancer Type Right breast cancer   ?  ? Lymphedema Assessments  ? Lymphedema Assessments Upper extremities   ?  ? Right Upper Extremity Lymphedema  ? 10 cm Proximal to Olecranon Process 25.7 cm   ? Olecranon Process 23 cm   ? 10 cm Proximal to Ulnar Styloid Process 19.7 cm   ? Just Proximal to Ulnar Styloid Process 14.4 cm   ? Across Hand at [employer] 18.7 cm   ? At [facility] of 2nd Digit 5.8 cm   ?  ? Left Upper Extremity Lymphedema  ? 10 cm Proximal to Olecranon Process 24.8 cm   ? Olecranon Process 23.4 cm   ? 10 cm Proximal to Ulnar Styloid Process 18.3 cm   ? Just Proximal to Ulnar Styloid Process 13.5 cm   ? Across Hand at [employer] 18.5 cm   ?  At [facility] of 2nd Digit 5.5 cm   ? ?  ?  ? ?  ? ? ? ? ? ? ? ? ? ? ? ? ? [facility] Adult PT Treatment/Exercise - 07/20/21 0001   ? ?  ? Manual Therapy  ? Manual therapy comments Pts arms remeasured for swelling   ? Edema Management pt is wearing her sleeve and gauntlet for 10 hours per day secondary to SOZO scores above baseline score by 13.2 points placing her in the red zone   ? Manual Lymphatic Drainage (MLD) PT instructed pt in function of lymphatics, pathways, drainage areas etc. Therapist performed all steps and then had pt practice. short neck, 5 breaths bilateral axillary LN's and right inguinal LN's,Anterior interaxillary pathway, right axillo-inguinal pathway, Right upper arm lateral, medial to lateral and lateral arm again retracing pathways, then to forearm both sides and retracing pathways, then hand retracing pathways and ending with LN's She was given written instructions.   ? ?   ?  ? ?  ? ? ? ? ? ? ? ? ? ? PT Education - 07/20/21 1704   ? ? Education Details Self MLD   ? Person(s) Educated Patient   ? Methods Explanation;Demonstration;Handout   ? Comprehension Returned demonstration;Need further instruction   ? ?  ?  ? ?  ? ? ? ? ? ? PT Long Term Goals - 06/29/21 1409   ? ?  ? PT LONG TERM GOAL #1  ? Title Patient will demonstrate she has regained full shoulder ROM and function post operatively compared to baselines.   ? Time 8   ? Period Weeks   ? Status Achieved   ? Target Date 05/17/21   ?  ? PT LONG TERM GOAL #2  ? Title Patient will report >/= 50% less c/o tightness at end ROM right shoulder.   ? Time 4   ? Period Weeks   ? Status Achieved   ?  ? PT LONG TERM GOAL #3  ? Title Patient will demonstrate >/= 50% reduction in visible cording (can compare photo under media tab)   ? Time 4   ? Period Weeks   ? Status Achieved   ? Target Date 06/01/21   ?  ? PT LONG TERM GOAL #4  ? Title Pt will be ind with final HEP for continued stretching   ? Time 3   ? Period Weeks   ? Status On-going   ? Target Date 07/27/21   ? ?  ?  ? ?  ? ? ? ? ? ? ? ? Plan - 07/20/21 1645   ? ? Clinical Impression Statement Pt seen today after being out of range on her Sozo screen. She is wearing her sleeve and gauntlet.  Remeasured arms with slight increases noted on right. Instructed pt in role of MLD, superficial nature of technique and importance of stretch.  Therapist performed all techniques and then had pt perform all techniques. She did very well overall with occasional VC's for technique but seemed to have a good grasp of sequence. She was given a handout with instructions and will perform daily at home in addition to wearing her sleeve.  She will return for atleast another review and measuring. Did not really have time to address cording today.   ? Stability/Clinical Decision Making Stable/Uncomplicated   ? Rehab Potential Excellent   ? PT Frequency 1x / week   ? PT Duration 4 weeks   ? PT  Treatment/Interventions  ADLs/Self Care Home Management;Therapeutic exercise;Patient/family education;Manual techniques;Manual lymph drainage;Passive range of motion   ? PT Next Visit Plan check swelling, right UE,Review right UE MLD, continue cord release prn.  may be able to be on hold or may benefit from more MLD   ? PT Home Exercise Plan Post op HEP and neural stretching, supine scap series, MLD right UE   ? Consulted and Agree with Plan of Care Patient   ? ?  ?  ? ?  ? ? ?Patient will benefit from skilled therapeutic intervention in order to improve the following deficits and impairments:  Postural dysfunction, Decreased range of motion, Decreased knowledge of precautions, Impaired UE functional use, Pain, Increased fascial restricitons ? ?Visit Diagnosis: ?Aftercare following surgery for neoplasm ? ?Pain in right arm ? ?Abnormal posture ? ?Malignant neoplasm of upper-outer quadrant of right breast in female, estrogen receptor positive ([place]) ? ? ? ? ?Problem List ?Patient Active Problem List  ? Diagnosis Date Noted  ? Port-A-Cath in place 05/18/2021  ? Genetic testing 04/03/2021  ? Family history of breast cancer 03/23/2021  ? Malignant neoplasm of upper-outer quadrant of right breast in female, estrogen receptor positive ([place]) 03/20/2021  ? Osteoporosis 12/10/2018  ? ? ?Claris Pong, PT ?07/20/2021, 5:05 PM ? ?[facility] ?[facility] ?[place], [place], [address] ?Phone: [phone]   Fax:  [phone] ? ?Name: Susan Reid ?MRN: [id number] ?Date of Birth: 17-May-1965 ? ? ? ?

## 2021-07-20 NOTE — Patient Instructions (Signed)
Pt given written instructions for right UE self MLD ?

## 2021-07-21 ENCOUNTER — Inpatient Hospital Stay: Payer: Managed Care, Other (non HMO)

## 2021-07-21 ENCOUNTER — Other Ambulatory Visit: Payer: Self-pay

## 2021-07-21 VITALS — BP 126/67 | HR 81 | Temp 98.0°F | Resp 18 | Wt 128.8 lb

## 2021-07-21 DIAGNOSIS — Z170 Estrogen receptor positive status [ER+]: Secondary | ICD-10-CM

## 2021-07-21 DIAGNOSIS — Z5112 Encounter for antineoplastic immunotherapy: Secondary | ICD-10-CM | POA: Diagnosis not present

## 2021-07-21 DIAGNOSIS — Z95828 Presence of other vascular implants and grafts: Secondary | ICD-10-CM

## 2021-07-21 LAB — CBC WITH DIFFERENTIAL (CANCER CENTER ONLY)
Abs Immature Granulocytes: 0.01 10*3/uL (ref 0.00–0.07)
Basophils Absolute: 0 10*3/uL (ref 0.0–0.1)
Basophils Relative: 1 %
Eosinophils Absolute: 0.2 10*3/uL (ref 0.0–0.5)
Eosinophils Relative: 5 %
HCT: 35.1 % — ABNORMAL LOW (ref 36.0–46.0)
Hemoglobin: 11.4 g/dL — ABNORMAL LOW (ref 12.0–15.0)
Immature Granulocytes: 0 %
Lymphocytes Relative: 34 %
Lymphs Abs: 1.2 10*3/uL (ref 0.7–4.0)
MCH: 30.6 pg (ref 26.0–34.0)
MCHC: 32.5 g/dL (ref 30.0–36.0)
MCV: 94.4 fL (ref 80.0–100.0)
Monocytes Absolute: 0.4 10*3/uL (ref 0.1–1.0)
Monocytes Relative: 11 %
Neutro Abs: 1.7 10*3/uL (ref 1.7–7.7)
Neutrophils Relative %: 49 %
Platelet Count: 293 10*3/uL (ref 150–400)
RBC: 3.72 MIL/uL — ABNORMAL LOW (ref 3.87–5.11)
RDW: 14.6 % (ref 11.5–15.5)
WBC Count: 3.5 10*3/uL — ABNORMAL LOW (ref 4.0–10.5)
nRBC: 0 % (ref 0.0–0.2)

## 2021-07-21 LAB — CMP (CANCER CENTER ONLY)
ALT: 26 U/L (ref 0–44)
AST: 20 U/L (ref 15–41)
Albumin: 4.1 g/dL (ref 3.5–5.0)
Alkaline Phosphatase: 73 U/L (ref 38–126)
Anion gap: 6 (ref 5–15)
BUN: 19 mg/dL (ref 6–20)
CO2: 29 mmol/L (ref 22–32)
Calcium: 9.1 mg/dL (ref 8.9–10.3)
Chloride: 107 mmol/L (ref 98–111)
Creatinine: 0.7 mg/dL (ref 0.44–1.00)
GFR, Estimated: >60 mL/min (ref >60)
Glucose, Bld: 83 mg/dL (ref 70–99)
Potassium: 4.1 mmol/L (ref 3.5–5.1)
Sodium: 142 mmol/L (ref 135–145)
Total Bilirubin: 0.4 mg/dL (ref 0.3–1.2)
Total Protein: 6.5 g/dL (ref 6.5–8.1)

## 2021-07-21 MED ORDER — SODIUM CHLORIDE 0.9 % IV SOLN: Freq: Once | INTRAVENOUS | Status: AC

## 2021-07-21 MED ORDER — TRASTUZUMAB-ANNS CHEMO 150 MG IV SOLR
2.0000 mg/kg | Freq: Once | INTRAVENOUS | Status: AC
  Administered 2021-07-21: 105 mg via INTRAVENOUS
  Filled 2021-07-21: qty 5

## 2021-07-21 MED ORDER — ACETAMINOPHEN 325 MG PO TABS
650.0000 mg | ORAL_TABLET | Freq: Once | ORAL | Status: AC
  Administered 2021-07-21: 650 mg via ORAL
  Filled 2021-07-21: qty 2

## 2021-07-21 MED ORDER — SODIUM CHLORIDE 0.9 % IV SOLN
80.0000 mg/m2 | Freq: Once | INTRAVENOUS | Status: AC
  Administered 2021-07-21: 126 mg via INTRAVENOUS
  Filled 2021-07-21: qty 21

## 2021-07-21 MED ORDER — HEPARIN SOD (PORK) LOCK FLUSH 100 UNIT/ML IV SOLN
500.0000 [IU] | Freq: Once | INTRAVENOUS | Status: AC | PRN
  Administered 2021-07-21: 500 [IU]

## 2021-07-21 MED ORDER — SODIUM CHLORIDE 0.9 % IV SOLN
10.0000 mg | Freq: Once | INTRAVENOUS | Status: AC
  Administered 2021-07-21: 10 mg via INTRAVENOUS
  Filled 2021-07-21: qty 10

## 2021-07-21 MED ORDER — SODIUM CHLORIDE 0.9% FLUSH
10.0000 mL | Freq: Once | INTRAVENOUS | Status: AC
  Administered 2021-07-21: 10 mL

## 2021-07-21 MED ORDER — DIPHENHYDRAMINE HCL 50 MG/ML IJ SOLN
12.5000 mg | Freq: Once | INTRAMUSCULAR | Status: AC
  Administered 2021-07-21: 12.5 mg via INTRAVENOUS
  Filled 2021-07-21: qty 1

## 2021-07-21 MED ORDER — FAMOTIDINE IN NACL 20-0.9 MG/50ML-% IV SOLN
20.0000 mg | Freq: Once | INTRAVENOUS | Status: AC
  Administered 2021-07-21: 20 mg via INTRAVENOUS
  Filled 2021-07-21: qty 50

## 2021-07-21 MED ORDER — SODIUM CHLORIDE 0.9% FLUSH
10.0000 mL | INTRAVENOUS | Status: DC | PRN
  Administered 2021-07-21: 10 mL

## 2021-07-21 NOTE — Patient Instructions (Signed)
[facility]  Discharge Instructions: ?Thank you for choosing [facility] to provide your oncology and hematology care.  ? ?If you have a lab appointment with the [facility], please go directly to the [facility] and check in at the registration area. ?  ?Wear comfortable clothing and clothing appropriate for easy access to any Portacath or PICC line.  ? ?We strive to give you quality time with your provider. You may need to reschedule your appointment if you arrive late (15 or more minutes).  Arriving late affects you and other patients whose appointments are after yours.  Also, if you miss three or more appointments without notifying the office, you may be dismissed from the clinic at the provider?s discretion.    ?  ?For prescription refill requests, have your pharmacy contact our office and allow 72 hours for refills to be completed.   ? ?Today you received the following chemotherapy and/or immunotherapy agents: Trastuzumab, Paclitaxel.    ?  ?To help prevent nausea and vomiting after your treatment, we encourage you to take your nausea medication as directed. ? ?BELOW ARE SYMPTOMS THAT SHOULD BE REPORTED IMMEDIATELY: ?*FEVER GREATER THAN 100.4 F (38 ?C) OR HIGHER ?*CHILLS OR SWEATING ?*NAUSEA AND VOMITING THAT IS NOT CONTROLLED WITH YOUR NAUSEA MEDICATION ?*UNUSUAL SHORTNESS OF BREATH ?*UNUSUAL BRUISING OR BLEEDING ?*URINARY PROBLEMS (pain or burning when urinating, or frequent urination) ?*BOWEL PROBLEMS (unusual diarrhea, constipation, pain near the anus) ?TENDERNESS IN MOUTH AND THROAT WITH OR WITHOUT PRESENCE OF ULCERS (sore throat, sores in mouth, or a toothache) ?UNUSUAL RASH, SWELLING OR PAIN  ?UNUSUAL VAGINAL DISCHARGE OR ITCHING  ? ?Items with * indicate a potential emergency and should be followed up as soon as possible or go to the Emergency Department if any problems should occur. ? ?Please show the CHEMOTHERAPY ALERT CARD or IMMUNOTHERAPY ALERT CARD  at check-in to the Emergency Department and triage nurse. ? ?Should you have questions after your visit or need to cancel or reschedule your appointment, please contact [facility]  Dept: [phone]  and follow the prompts.  Office hours are 8:00 a.m. to 4:30 p.m. Monday - Friday. Please note that voicemails left after 4:00 p.m. may not be returned until the following business day.  We are closed weekends and major holidays. You have access to a nurse at all times for urgent questions. Please call the main number to the clinic Dept: [phone] and follow the prompts. ? ? ?For any non-urgent questions, you may also contact your provider using MyChart. We now offer e-Visits for anyone 61 and older to request care online for non-urgent symptoms. For details visit [email or web address]. ?  ?Also download the MyChart app! Go to the app store, search "MyChart", open the app, select [facility], and log in with your MyChart username and password. ? ?Due to Covid, a mask is required upon entering the hospital/clinic. If you do not have a mask, one will be given to you upon arrival. For doctor visits, patients may have 1 support person aged 66 or older with them. For treatment visits, patients cannot have anyone with them due to current Covid guidelines and our immunocompromised population.  ? ?

## 2021-07-24 ENCOUNTER — Encounter: Payer: Self-pay | Admitting: Hematology and Oncology

## 2021-07-24 NOTE — Progress Notes (Signed)
Patient called regarding a claim denial and copay assistance for [employer]. Advised I would have Lenise whom submits claim submissions to foundation after insurance pays to see if she can see differently and have her follow up. ? ?Email sent to [facility]. ?

## 2021-07-26 ENCOUNTER — Ambulatory Visit: Payer: Managed Care, Other (non HMO) | Attending: General Surgery

## 2021-07-26 DIAGNOSIS — R600 Localized edema: Secondary | ICD-10-CM | POA: Insufficient documentation

## 2021-07-26 DIAGNOSIS — R293 Abnormal posture: Secondary | ICD-10-CM | POA: Diagnosis present

## 2021-07-26 DIAGNOSIS — Z483 Aftercare following surgery for neoplasm: Secondary | ICD-10-CM | POA: Diagnosis present

## 2021-07-26 DIAGNOSIS — Z170 Estrogen receptor positive status [ER+]: Secondary | ICD-10-CM | POA: Diagnosis present

## 2021-07-26 DIAGNOSIS — C50411 Malignant neoplasm of upper-outer quadrant of right female breast: Secondary | ICD-10-CM | POA: Insufficient documentation

## 2021-07-26 DIAGNOSIS — M79601 Pain in right arm: Secondary | ICD-10-CM | POA: Insufficient documentation

## 2021-07-26 NOTE — Patient Instructions (Signed)

## 2021-07-26 NOTE — Therapy (Signed)
[facility] ?[facility] ?[place], [place], [address] ?Phone: [phone]   Fax:  [phone] ? ?Physical Therapy Treatment ? ?Patient Details  ?Name: Susan Reid ?MRN: [id number] ?Date of Birth: [birth year] ?Referring Provider (PT): Dr. Stark Klein ? ? ?Encounter Date: 07/26/2021 ? ? PT End of Session - 07/26/21 1600   ? ? Visit Number 13   ? Number of Visits 21   ? Date for PT Re-Evaluation 08/23/21   ? PT Start Time 1601   ? PT Stop Time 1700   ? PT Time Calculation (min) 59 min   ? Activity Tolerance Patient tolerated treatment well   ? Behavior During Therapy [facility] for tasks assessed/performed   ? ?  ?  ? ?  ? ? ?Past Medical History:  ?Diagnosis Date  ? Allergy   ? Family history of adverse reaction to anesthesia   ? Mother got severe N/V and pneumonia following anesthesia  ? Osteopenia   ? ? ?Past Surgical History:  ?Procedure Laterality Date  ? AXILLARY SENTINEL NODE BIOPSY Right 04/20/2021  ? Procedure: RIGHT AXILLARY SENTINEL NODE BIOPSY;  Surgeon: Stark Klein, MD;  Location: [facility];  Service: General;  Laterality: Right;  ? BREAST BIOPSY Right 02/25/2020  ? [facility]  ? BREAST BIOPSY Right 03/14/2021  ? BREAST LUMPECTOMY WITH SENTINEL LYMPH NODE BIOPSY Right 04/20/2021  ? Procedure: RIGHT BREAST LUMPECTOMY;  Surgeon: Stark Klein, MD;  Location: [facility];  Service: General;  Laterality: Right;  ? PORTACATH PLACEMENT Left 04/20/2021  ? Procedure: INSERTION PORT-A-CATH;  Surgeon: Stark Klein, MD;  Location: [facility];  Service: General;  Laterality: Left;  ? WISDOM TOOTH EXTRACTION  04/24/1987  ? ? ?There were no vitals filed for this visit. ? ? Subjective Assessment - 07/26/21 1602   ? ? Subjective I have been doing MLD every day at home and at my desk, multiple times.   The more I use my hand the more it swells. It goes down pretty quickly if I elevate. The little lump under my arm where she took out the LN's was smaller.   ? 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 She had a right lumpectomy and senitnel node biopsy (5 negative nodes) on 04/20/2021. It is ER positive, PR negative, and HER2 positive with a Ki67 of 80%.   ? ?  ?  ? ?  ? ? ? ? ? ? ? ? LYMPHEDEMA/ONCOLOGY QUESTIONNAIRE - 07/26/21 0001   ? ?  ? Right Upper Extremity Lymphedema  ? 10 cm Proximal to Olecranon Process 25.4 cm   ? 10 cm Proximal to Ulnar Styloid Process 19.4 cm   ? Just Proximal to Ulnar Styloid Process 14.1 cm   ? At [facility] of 2nd Digit 5.8 cm   ?  ? Left Upper Extremity Lymphedema  ? 10 cm Proximal to Olecranon Process 24.6 cm   ? 10 cm Proximal to Ulnar Styloid Process 18.8 cm   ? ?  ?  ? ?  ? ? ? ? ? ? ? ? ? ? ? ? ? [facility] Adult PT Treatment/Exercise - 07/26/21 0001   ? ?  ? Manual Therapy  ? Manual therapy comments Pts arms remeasured for swelling   ? Compression Bandaging pt was instructed in compression bandaging : small TG soft applied first hand to axilla, 3 in elastomull folded to thumb and 4 fingers, artiflex  hand to  axilla. gray foam at dorsum of hand and 6 cm wrap wrist to hand with atleast 4 layers at MCPS. Pt practiced each wrap 1 time after demonstration by PT. PT applied 10 cm wrap wrist to axilla. Pt declined doing that wrap. Pt was given illustrated and written instructions for bandaging and for care of bandages. Also briefly discussed laundering of sleeve/bandages. Explained the difference between sleeve and bandages and why sleeve is not worn at night, and the importance of laundering.   ? ?  ?  ? ?  ? ? ? ? ? ? ? ? ? ? ? ? ? ? ? PT Long Term Goals - 07/26/21 1715   ? ?  ? PT LONG TERM GOAL #1  ? Title Patient will demonstrate she has regained full shoulder ROM and function post operatively compared to baselines.   ? Period Weeks   ? Status Achieved   ? Target Date 05/17/21   ?  ? PT LONG TERM GOAL #2  ? Title Patient will report >/= 50% less c/o tightness at end ROM right shoulder.   ? Time 4   ?  Period Weeks   ? Status Achieved   ? Target Date 06/01/21   ?  ? PT LONG TERM GOAL #3  ? Title Patient will demonstrate >/= 50% reduction in visible cording (can compare photo under media tab)   ? Time 4   ? Period Weeks   ? Status Achieved   ? Target Date 06/01/21   ?  ? PT LONG TERM GOAL #4  ? Title Pt will be ind with final HEP for continued stretching   ? Period Weeks   ? Status On-going   ? Target Date 08/23/21   ?  ? PT LONG TERM GOAL #5  ? Title Pt will be independent with MLD to the right UE   ? Time 4   ? Period Weeks   ? Status New   ? Target Date 08/23/21   ?  ? Additional Long Term Goals  ? Additional Long Term Goals Yes   ?  ? PT LONG TERM GOAL #6  ? Title Pt will be independent in compression bandaging to reduce right hand and UE edema   ? Time 4   ? Status New   ? Target Date 08/23/21   ?  ? PT LONG TERM GOAL #7  ? Title Pt will have decreased swelling at dorusmof hand on the right by 50%   ? Time 4   ? Period Weeks   ? Status Achieved   ? Target Date 08/23/21   ? ?  ?  ? ?  ? ? ? ? ? ? ? ? Plan - 07/26/21 1709   ? ? Clinical Impression Statement Dorsum of pts hand is still swollen and veins/tendons much less visible on the right. Arms remeasured in several spots with slight reduction in arm. Discussed bandaging with pt and she is agreeable with learning. She will try and wrap during the day but if she can't tolerate it with work will wrap at night only and wear sleeve/gauntlet during the day. pt was instructed in finger and  hand wrapping with PT demonstrating and then pt practicing. Therapist applied last arm wrap. Pt did very well with occasional VC's and TC's to correct.  She took photos and was also given written and illustrated instructions.  She feels her husband will also be able to wrap. We are extending visits to review MLD  and bandaging prn and to continue to address cording prn, although she is feeling much better now with her cording and today it is much less visible. She starts radiation  on 09/15/2021, and has her simulation next Thursday.   ? Stability/Clinical Decision Making Stable/Uncomplicated   ? Rehab Potential Excellent   ? PT Frequency 2x / week   ? PT Duration 4 weeks   ? PT Treatment/Interventions ADLs/Self Care Home Management;Therapeutic exercise;Patient/family education;Manual techniques;Manual lymph drainage;Passive range of motion;Compression bandaging   ? PT Next Visit Plan Review MLD, bandaging prn, remeasure, cording   ? PT Home Exercise Plan Post op HEP and neural stretching, supine scap series, MLD right UE   ? Consulted and Agree with Plan of Care Patient   ? ?  ?  ? ?  ? ? ?Patient will benefit from skilled therapeutic intervention in order to improve the following deficits and impairments:  Postural dysfunction, Decreased range of motion, Decreased knowledge of precautions, Impaired UE functional use, Pain, Increased fascial restricitons ? ?Visit Diagnosis: ?Aftercare following surgery for neoplasm ? ?Pain in right arm ? ?Abnormal posture ? ?Malignant neoplasm of upper-outer quadrant of right breast in female, estrogen receptor positive ([facility]) ? ? ? ? ?Problem List ?Patient Active Problem List  ? Diagnosis Date Noted  ? Port-A-Cath in place 05/18/2021  ? Genetic testing 04/03/2021  ? Family history of breast cancer 03/23/2021  ? Malignant neoplasm of upper-outer quadrant of right breast in female, estrogen receptor positive ([facility]) 03/20/2021  ? Osteoporosis 12/10/2018  ? ? ?Claris Pong, PT ?07/26/2021, 5:18 PM ? ?[facility] ? @ [facility] ?[place], [place], [address] ?Phone: [phone]   Fax:  [phone] ? ?Name: Susan Reid ?MRN: [id number] ?Date of Birth: [birth year] ? ? ? ?

## 2021-07-28 ENCOUNTER — Other Ambulatory Visit: Payer: Self-pay | Admitting: Adult Health

## 2021-07-28 ENCOUNTER — Encounter: Payer: Self-pay | Admitting: Adult Health

## 2021-07-28 ENCOUNTER — Ambulatory Visit: Payer: Managed Care, Other (non HMO)

## 2021-07-28 ENCOUNTER — Encounter: Payer: Self-pay | Admitting: *Deleted

## 2021-07-28 ENCOUNTER — Inpatient Hospital Stay: Payer: Managed Care, Other (non HMO) | Attending: Hematology and Oncology | Admitting: Adult Health

## 2021-07-28 ENCOUNTER — Other Ambulatory Visit: Payer: Managed Care, Other (non HMO)

## 2021-07-28 ENCOUNTER — Inpatient Hospital Stay: Payer: Managed Care, Other (non HMO)

## 2021-07-28 ENCOUNTER — Other Ambulatory Visit: Payer: Self-pay

## 2021-07-28 VITALS — BP 121/80 | HR 80 | Temp 97.6°F | Resp 16 | Ht 65.0 in | Wt 128.0 lb

## 2021-07-28 DIAGNOSIS — Z170 Estrogen receptor positive status [ER+]: Secondary | ICD-10-CM

## 2021-07-28 DIAGNOSIS — Z95828 Presence of other vascular implants and grafts: Secondary | ICD-10-CM

## 2021-07-28 DIAGNOSIS — C50411 Malignant neoplasm of upper-outer quadrant of right female breast: Secondary | ICD-10-CM | POA: Insufficient documentation

## 2021-07-28 DIAGNOSIS — Z5112 Encounter for antineoplastic immunotherapy: Secondary | ICD-10-CM | POA: Insufficient documentation

## 2021-07-28 DIAGNOSIS — Z5111 Encounter for antineoplastic chemotherapy: Secondary | ICD-10-CM | POA: Insufficient documentation

## 2021-07-28 LAB — CMP (CANCER CENTER ONLY)
ALT: 25 U/L (ref 0–44)
AST: 19 U/L (ref 15–41)
Albumin: 4 g/dL (ref 3.5–5.0)
Alkaline Phosphatase: 78 U/L (ref 38–126)
Anion gap: 5 (ref 5–15)
BUN: 21 mg/dL — ABNORMAL HIGH (ref 6–20)
CO2: 28 mmol/L (ref 22–32)
Calcium: 9.1 mg/dL (ref 8.9–10.3)
Chloride: 107 mmol/L (ref 98–111)
Creatinine: 0.8 mg/dL (ref 0.44–1.00)
GFR, Estimated: >60 mL/min (ref >60)
Glucose, Bld: 90 mg/dL (ref 70–99)
Potassium: 4.1 mmol/L (ref 3.5–5.1)
Sodium: 140 mmol/L (ref 135–145)
Total Bilirubin: 0.3 mg/dL (ref 0.3–1.2)
Total Protein: 6.4 g/dL — ABNORMAL LOW (ref 6.5–8.1)

## 2021-07-28 LAB — CBC WITH DIFFERENTIAL (CANCER CENTER ONLY)
Abs Immature Granulocytes: 0.01 10*3/uL (ref 0.00–0.07)
Basophils Absolute: 0 10*3/uL (ref 0.0–0.1)
Basophils Relative: 1 %
Eosinophils Absolute: 0.1 10*3/uL (ref 0.0–0.5)
Eosinophils Relative: 4 %
HCT: 34.1 % — ABNORMAL LOW (ref 36.0–46.0)
Hemoglobin: 11.3 g/dL — ABNORMAL LOW (ref 12.0–15.0)
Immature Granulocytes: 0 %
Lymphocytes Relative: 31 %
Lymphs Abs: 1.1 10*3/uL (ref 0.7–4.0)
MCH: 31.2 pg (ref 26.0–34.0)
MCHC: 33.1 g/dL (ref 30.0–36.0)
MCV: 94.2 fL (ref 80.0–100.0)
Monocytes Absolute: 0.4 10*3/uL (ref 0.1–1.0)
Monocytes Relative: 12 %
Neutro Abs: 1.8 10*3/uL (ref 1.7–7.7)
Neutrophils Relative %: 52 %
Platelet Count: 295 10*3/uL (ref 150–400)
RBC: 3.62 MIL/uL — ABNORMAL LOW (ref 3.87–5.11)
RDW: 14.6 % (ref 11.5–15.5)
WBC Count: 3.5 10*3/uL — ABNORMAL LOW (ref 4.0–10.5)
nRBC: 0 % (ref 0.0–0.2)

## 2021-07-28 MED ORDER — SODIUM CHLORIDE 0.9 % IV SOLN
80.0000 mg/m2 | Freq: Once | INTRAVENOUS | Status: AC
  Administered 2021-07-28: 126 mg via INTRAVENOUS
  Filled 2021-07-28: qty 21

## 2021-07-28 MED ORDER — SODIUM CHLORIDE 0.9% FLUSH
10.0000 mL | Freq: Once | INTRAVENOUS | Status: AC
  Administered 2021-07-28: 10 mL

## 2021-07-28 MED ORDER — TRASTUZUMAB-ANNS CHEMO 150 MG IV SOLR
2.0000 mg/kg | Freq: Once | INTRAVENOUS | Status: AC
  Administered 2021-07-28: 105 mg via INTRAVENOUS
  Filled 2021-07-28: qty 5

## 2021-07-28 MED ORDER — FAMOTIDINE IN NACL 20-0.9 MG/50ML-% IV SOLN
20.0000 mg | Freq: Once | INTRAVENOUS | Status: AC
  Administered 2021-07-28: 20 mg via INTRAVENOUS
  Filled 2021-07-28: qty 50

## 2021-07-28 MED ORDER — SODIUM CHLORIDE 0.9 % IV SOLN: Freq: Once | INTRAVENOUS | Status: AC

## 2021-07-28 MED ORDER — DIPHENHYDRAMINE HCL 50 MG/ML IJ SOLN
12.5000 mg | Freq: Once | INTRAMUSCULAR | Status: AC
  Administered 2021-07-28: 12.5 mg via INTRAVENOUS
  Filled 2021-07-28: qty 1

## 2021-07-28 MED ORDER — HEPARIN SOD (PORK) LOCK FLUSH 100 UNIT/ML IV SOLN
500.0000 [IU] | Freq: Once | INTRAVENOUS | Status: AC | PRN
  Administered 2021-07-28: 500 [IU]

## 2021-07-28 MED ORDER — ACETAMINOPHEN 325 MG PO TABS
650.0000 mg | ORAL_TABLET | Freq: Once | ORAL | Status: AC
  Administered 2021-07-28: 650 mg via ORAL
  Filled 2021-07-28: qty 2

## 2021-07-28 MED ORDER — SODIUM CHLORIDE 0.9 % IV SOLN
10.0000 mg | Freq: Once | INTRAVENOUS | Status: AC
  Administered 2021-07-28: 10 mg via INTRAVENOUS
  Filled 2021-07-28: qty 10

## 2021-07-28 MED ORDER — SODIUM CHLORIDE 0.9% FLUSH
10.0000 mL | INTRAVENOUS | Status: DC | PRN
  Administered 2021-07-28: 10 mL

## 2021-07-28 NOTE — Assessment & Plan Note (Signed)
03/14/2021:Palpable right breast mass diagnostic mammogram: 1.6 cm mass in the right breast at 9 o'clock. Biopsy: Grade 2-3 invasive ductal carcinoma, Her2+, Copy #5.85, ratio 3.16 ER+(60%)/PR+(0%), Ki-67 80%.? ?04/20/21: Rt Lumpectomy: Grade 3 IDC 0/5 Ln neg, Margins Neg,?Her2+, Copy #5.85, ratio 3.16 ER+(60%)/PR+(0%), Ki-67 80%. ?? ?Treatment Plan: ?1.?adjuvant chemotherapy with Taxol Herceptin followed by Herceptin maintenance ?2.??Adjuvant radiation therapy ?3.??Adjuvant antiestrogen therapy ?--------------------------------------------------------------------------------------------------------------------------------------------------- ?Current treatment: Cycle?11?Taxol Herceptin ?Echocardiogram 07/10/2021: EF 60 to 65% ?Labs reviewed ?Chemo toxicities: ?1. Slight infusion reaction: Resolved ?2.   slight lymphedema in the right arm: Patient is seeing physical therapy for this ?Denies any peripheral neuropathy. ?Complains of fatigue ? ?She will return next week for her final dose of chemotherapy then will continue on adjuvant Herceptin every 3 weeks.  She is going to see radiation oncology next week as well. ? ?She will return in 4 weeks for labs, follow-up, Herceptin. ?

## 2021-07-28 NOTE — Patient Instructions (Signed)
[facility]  Discharge Instructions: ?Thank you for choosing [facility] to provide your oncology and hematology care.  ? ?If you have a lab appointment with the [facility], please go directly to the [facility] and check in at the registration area. ?  ?Wear comfortable clothing and clothing appropriate for easy access to any Portacath or PICC line.  ? ?We strive to give you quality time with your provider. You may need to reschedule your appointment if you arrive late (15 or more minutes).  Arriving late affects you and other patients whose appointments are after yours.  Also, if you miss three or more appointments without notifying the office, you may be dismissed from the clinic at the provider?s discretion.    ?  ?For prescription refill requests, have your pharmacy contact our office and allow 72 hours for refills to be completed.   ? ?Today you received the following chemotherapy and/or immunotherapy agents: Trastuzumab, Paclitaxel.    ?  ?To help prevent nausea and vomiting after your treatment, we encourage you to take your nausea medication as directed. ? ?BELOW ARE SYMPTOMS THAT SHOULD BE REPORTED IMMEDIATELY: ?*FEVER GREATER THAN 100.4 F (38 ?C) OR HIGHER ?*CHILLS OR SWEATING ?*NAUSEA AND VOMITING THAT IS NOT CONTROLLED WITH YOUR NAUSEA MEDICATION ?*UNUSUAL SHORTNESS OF BREATH ?*UNUSUAL BRUISING OR BLEEDING ?*URINARY PROBLEMS (pain or burning when urinating, or frequent urination) ?*BOWEL PROBLEMS (unusual diarrhea, constipation, pain near the anus) ?TENDERNESS IN MOUTH AND THROAT WITH OR WITHOUT PRESENCE OF ULCERS (sore throat, sores in mouth, or a toothache) ?UNUSUAL RASH, SWELLING OR PAIN  ?UNUSUAL VAGINAL DISCHARGE OR ITCHING  ? ?Items with * indicate a potential emergency and should be followed up as soon as possible or go to the Emergency Department if any problems should occur. ? ?Please show the CHEMOTHERAPY ALERT CARD or IMMUNOTHERAPY ALERT CARD  at check-in to the Emergency Department and triage nurse. ? ?Should you have questions after your visit or need to cancel or reschedule your appointment, please contact [facility]  Dept: [phone]  and follow the prompts.  Office hours are 8:00 a.m. to 4:30 p.m. Monday - Friday. Please note that voicemails left after 4:00 p.m. may not be returned until the following business day.  We are closed weekends and major holidays. You have access to a nurse at all times for urgent questions. Please call the main number to the clinic Dept: [phone] and follow the prompts. ? ? ?For any non-urgent questions, you may also contact your provider using MyChart. We now offer e-Visits for anyone 44 and older to request care online for non-urgent symptoms. For details visit [email or web address]. ?  ?Also download the MyChart app! Go to the app store, search "MyChart", open the app, select [facility], and log in with your MyChart username and password. ? ?Due to Covid, a mask is required upon entering the hospital/clinic. If you do not have a mask, one will be given to you upon arrival. For doctor visits, patients may have 1 support Kasheem Toner aged 22 or older with them. For treatment visits, patients cannot have anyone with them due to current Covid guidelines and our immunocompromised population.  ? ?

## 2021-07-28 NOTE — Progress Notes (Signed)
[facility] Follow up: ?  ? ?Susan Noe, MD ?Susan Reid [id number] ? ? ?DIAGNOSIS:  Cancer Staging  ?Malignant neoplasm of upper-outer quadrant of right breast in female, estrogen receptor positive (Susan Reid) ?Staging form: Breast, AJCC 8th Edition ?- Clinical stage from 03/22/2021: Stage IA (cT1b, cN0, cM0, G3, ER+, PR-, HER2+) - Signed by [name], MD on 03/22/2021 ?Stage prefix: Initial diagnosis ?Histologic grading system: 3 grade system ?- Pathologic stage from 04/20/2021: Stage IA (pT1c, pN0, cM0, G3, ER+, PR-, HER2+) - Signed by [name], NP on 06/30/2021 ?Stage prefix: Initial diagnosis ?Histologic grading system: 3 grade system ? ? ?SUMMARY OF ONCOLOGIC HISTORY: ?Oncology History  ?Malignant neoplasm of upper-outer quadrant of right breast in female, estrogen receptor positive (Susan Reid)  ?03/14/2021 Initial Diagnosis  ? Palpable right breast mass diagnostic mammogram: 1.6 cm mass in the right breast at 9 o'clock. 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  ?  ?03/22/2021 Cancer Staging  ? Staging form: Breast, AJCC 8th Edition ?- Clinical stage from 03/22/2021: Stage IA (cT1b, cN0, cM0, G3, ER+, PR-, HER2+) - Signed by [name], MD on 03/22/2021 ?Stage prefix: Initial diagnosis ?Histologic grading system: 3 grade system ? ?  ? Genetic Testing  ? [facility] CustomNext Panel was Negative. Report date is 04/03/2021. ? ?The CustomNext-Cancer+RNAinsight panel offered by [facility] includes sequencing and rearrangement analysis for the following 47 genes:  APC, ATM, AXIN2, BARD1, BMPR1A, BRCA1, BRCA2, BRIP1, CDH1, CDK4, CDKN2A, CHEK2, CTNNA1, DICER1, EPCAM, GREM1, HOXB13, KIT, MEN1, MLH1, MSH2, MSH3, MSH6, MUTYH, NBN, NF1, NTHL1, PALB2, PDGFRA, PMS2, POLD1, POLE, PTEN, RAD50, RAD51C, RAD51D, SDHA, SDHB, SDHC, SDHD, SMAD4, SMARCA4, STK11, TP53, TSC1, TSC2, and VHL.  RNA data is routinely analyzed  for use in variant interpretation for all genes. ?  ?04/20/2021 Surgery  ? Rt Lumpectomy: Grade 3 IDC 1.9 cm 0/5 Ln neg, Margins Neg, Her2+, Copy #5.85, ratio 3.16 ER+(60%)/PR+(0%), Ki-67 80% ?  ?04/20/2021 Cancer Staging  ? Staging form: Breast, AJCC 8th Edition ?- Pathologic stage from 04/20/2021: Stage IA (pT1c, pN0, cM0, G3, ER+, PR-, HER2+) - Signed by [name], NP on 06/30/2021 ?Stage prefix: Initial diagnosis ?Histologic grading system: 3 grade system ? ?  ?05/18/2021 -  Chemotherapy  ? Patient is on Treatment Plan : BREAST Paclitaxel + Trastuzumab q7d / Trastuzumab q21d  ?   ? ? ?CURRENT THERAPY: Taxol/Hercpetin ? ?INTERVAL HISTORY: ?ANAMAE Susan Reid 56 y.o. female returns for follow-up prior to receiving Taxol Herceptin.  She is due for cycle 11 today.  She is not experiencing any neuropathy.She is mildly fatigued.  She denies any significant issues.   ? ? ?Patient Active Problem List  ? Diagnosis Date Noted  ? Port-A-Cath in place 05/18/2021  ? Genetic testing 04/03/2021  ? Family history of breast cancer 03/23/2021  ? Malignant neoplasm of upper-outer quadrant of right breast in female, estrogen receptor positive (Susan Reid) 03/20/2021  ? Osteoporosis 12/10/2018  ? ? ?is allergic to nitrates, organic. ? ?MEDICAL HISTORY: ?Past Medical History:  ?Diagnosis Date  ? Allergy   ? Family history of adverse reaction to anesthesia   ? Mother got severe N/V and pneumonia following anesthesia  ? Osteopenia   ? ? ?SURGICAL HISTORY: ?Past Surgical History:  ?Procedure Laterality Date  ? AXILLARY SENTINEL NODE BIOPSY Right 04/20/2021  ? Procedure: RIGHT AXILLARY SENTINEL NODE BIOPSY;  Surgeon: Stark Klein, MD;  Location: [facility];  Service:  General;  Laterality: Right;  ? BREAST BIOPSY Right 02/25/2020  ? [facility]  ? BREAST BIOPSY Right 03/14/2021  ? BREAST LUMPECTOMY WITH SENTINEL LYMPH NODE BIOPSY Right 04/20/2021  ? Procedure: RIGHT BREAST LUMPECTOMY;  Surgeon: Stark Klein, MD;  Location: [facility];  Service:  General;  Laterality: Right;  ? PORTACATH PLACEMENT Left 04/20/2021  ? Procedure: INSERTION PORT-A-CATH;  Surgeon: Stark Klein, MD;  Location: [facility];  Service: General;  Laterality: Left;  ? WISDOM TOOTH EXTRACTION  04/24/1987  ? ? ?SOCIAL HISTORY: ?Social History  ? ?Socioeconomic History  ? Marital status: Married  ?  Spouse name: [name]  ? Number of children: 2  ? Years of education: High school  ? Highest education level: Not on file  ?Occupational History  ? Not on file  ?Tobacco Use  ? Smoking status: Never  ? Smokeless tobacco: Never  ?Vaping Use  ? Vaping Use: Never used  ?Substance and Sexual Activity  ? Alcohol use: Yes  ?  Comment: one a month or less  ? Drug use: No  ? Sexual activity: Yes  ?  Birth control/protection: Post-menopausal  ?Other Topics Concern  ? Not on file  ?Social History Narrative  ? 10/28/19  ? From: the area  ? Living: with Husband, [name]  ? Work: Nature conservation officer  ?   ? Family: 2 children - Susan Reid (nurse at [employer]) and Susan Reid (family medicine resident in [place]) - both married 2020 and 2021  ?   ? Enjoys: work outside in the yard, hiking  ?   ? Exercise: walking daily, was going to the gym  ? Diet: diabetic diet - avoiding added sugar  ?   ? Safety  ? Seat belts: Yes   ? Guns: Yes  and secure  ? Safe in relationships: Yes   ? ?Social Determinants of Health  ? ?Financial Resource Strain: Not on file  ?Food Insecurity: Not on file  ?Transportation Needs: Not on file  ?Physical Activity: Not on file  ?Stress: Not on file  ?Social Connections: Not on file  ?Intimate Partner Violence: Not on file  ? ? ?FAMILY HISTORY: ?Family History  ?Problem Relation Age of Onset  ? Arthritis Mother   ? Hyperlipidemia Mother   ? Heart disease Mother   ? Stroke Mother 52  ? Hypertension Mother   ? Kidney disease Mother   ? Breast cancer Mother 33  ? Cirrhosis Mother   ? Mental illness Father   ? Depression Father   ? Lung cancer Maternal Aunt 88  ? Lung cancer Maternal  Uncle 75  ? Cancer Maternal Grandfather   ?     Oral  - smoker  ? Lung cancer Maternal Grandfather   ? Cancer Paternal Grandmother   ?     unknown type  ? Prostate cancer Paternal Grandfather   ? Breast cancer Maternal Great-grandmother   ? Colon cancer Neg Hx   ? Colon polyps Neg Hx   ? Esophageal cancer Neg Hx   ? Rectal cancer Neg Hx   ? Stomach cancer Neg Hx   ? ? ?Review of Systems  ?Constitutional:  Positive for fatigue. Negative for appetite change, chills, fever and unexpected weight change.  ?HENT:   Negative for hearing loss, lump/mass and trouble swallowing.   ?Eyes:  Negative for eye problems and icterus.  ?Respiratory:  Negative for chest tightness, cough and shortness of breath.   ?Cardiovascular:  Negative for chest pain,  leg swelling and palpitations.  ?Gastrointestinal:  Negative for abdominal distention, abdominal pain, constipation, diarrhea, nausea and vomiting.  ?Endocrine: Negative for hot flashes.  ?Genitourinary:  Negative for difficulty urinating.   ?Musculoskeletal:  Negative for arthralgias.  ?Skin:  Negative for itching and rash.  ?Neurological:  Negative for dizziness, extremity weakness, headaches and numbness.  ?Hematological:  Negative for adenopathy. Does not bruise/bleed easily.  ?Psychiatric/Behavioral:  Negative for depression. The patient is not nervous/anxious.    ? ? ?PHYSICAL EXAMINATION ? ?ECOG PERFORMANCE STATUS: 1 - Symptomatic but completely ambulatory ? ?Vitals:  ? 07/28/21 1118  ?BP: 121/80  ?Pulse: 80  ?Resp: 16  ?Temp: 97.6 ?F (36.4 ?C)  ?SpO2: 100%  ? ? ?Physical Exam ?Constitutional:   ?   General: She is not in acute distress. ?   Appearance: Normal appearance. She is not toxic-appearing.  ?HENT:  ?   Head: Normocephalic and atraumatic.  ?Eyes:  ?   General: No scleral icterus. ?Cardiovascular:  ?   Rate and Rhythm: Normal rate and regular rhythm.  ?   Pulses: Normal pulses.  ?   Heart sounds: Normal heart sounds.  ?Pulmonary:  ?   Effort: Pulmonary effort is  normal.  ?   Breath sounds: Normal breath sounds.  ?Abdominal:  ?   General: Abdomen is flat. Bowel sounds are normal. There is no distension.  ?   Palpations: Abdomen is soft.  ?   Tenderness: There is no abdomina

## 2021-07-28 NOTE — Progress Notes (Signed)
Location of Breast Cancer: upper-outer quadrant of right breast  ? ?Histology per Pathology Report: grade 2-3 invasive ductal carcinoma measuring 0.9 cm in the greatest linear extent ? ?Receptor Status:  ?estrogen receptor, 60% positive with weak staining intensity, and progesterone receptor, 0% negative. Proliferation marker Ki67 at 80%. HER2 positive. ? ?Did patient present with symptoms (if so, please note symptoms) or was this found on screening mammography?: presented with a tender palpable lump in the lateral aspect of the right breast. Patient also reported pain in this region  ? ?Past/Anticipated interventions by surgeon, if any:  ?04/20/2021 ?Right Breast Lumpectomy with Sentinel Node Mapping and Biopsy, left subclavian port placement ?Surgeon: Stark Klein, MD ? ?Past/Anticipated interventions by medical oncology, if any: Dr Lindi Adie ?05/18/2021 -  Chemotherapy  ?  Patient is on Treatment Plan : BREAST Paclitaxel + Trastuzumab q7d / Trastuzumab q21d   ? ? ?Lymphedema issues, if any:  yes, being treated for right hand lymphedema ? ?Pain issues, if any:   aching in joints and legs ? ?SAFETY ISSUES: ?Prior radiation? no ?Pacemaker/ICD? no ?Possible current pregnancy?no, postmenopausal ?Is the patient on methotrexate? no ? ?Current Complaints / other details:  insomnia ?   ?Vitals:  ? 08/03/21 1237  ?BP: 118/74  ?Pulse: 86  ?Resp: 20  ?Temp: (!) 97.5 ?F (36.4 ?C)  ?SpO2: 99%  ?Weight: 127 lb 3.2 oz (57.7 kg)  ?Height: 5' 5"  (1.651 m)  ? ? ? ? ?

## 2021-08-02 NOTE — Progress Notes (Signed)
?Radiation Oncology         [phone] ?________________________________ ? ?Name: Susan Reid MRN: [id number]  ?Date: 08/03/2021  DOB: 1966/04/10 ? ?Re-Evaluation Note ? ?CC: Lesleigh Noe, MD  [name], MD ? ?  ICD-10-CM   ?1. Malignant neoplasm of upper-outer quadrant of right breast in female, estrogen receptor positive ([place])  C50.411   ? Z17.0   ?  ? ? ?Diagnosis:  S/p right lumpectomy and chemotherapy: Stage IA (pT1c, pN0, cM0) Right Breast UOQ, Invasive Ductal Carcinoma, ER+ / PR- / Her2+, Grade 3 ? ?Narrative:  The patient returns today to discuss radiation treatment options. She was seen in the multidisciplinary breast clinic on 03/22/21.  ? ?Since consultation, she underwent genetic testing on 03/22/21. Results showed no clinically significant variants detected by BRCAplus or +RNAinsight testing. ? ?She is currently undergoing chemotherapy consisting of Taxol and Herceptin under the care of Dr. Lindi Adie; s/p 11 cycles. She will complete her final cycle this week. Per her most recent follow up visit with Mendel Ryder Causey-NP on 07/28/21, the patient was noted to be tolerating systemic treatment well other than some mild fatigue. She denies any other symptoms from treatment other than some slight lymphedema in her right arm which she is seeing physical therapy for (ongoing). ? ?She opted to proceed with right breast lumpectomy with nodal biopsies on 04/20/21 under the care of Dr. Barry Dienes. Pathology from the procedure revealed: grade 3 invasive ductal carcinoma measuring 1.9 cm, with all margins negative for carcinoma. Nodal status of 4/4 right axillary sentinel lymph node excisions negative for carcinoma. Prognostic indicators significant for: estrogen receptor 60% positive with weak staining intensity; progesterone receptor negative; Proliferation marker Ki67 at 80%; Her2 status positive; Grade 3.  ? ?Pertinent imaging performed in the interval since she was last seen includes:  ?--Bilateral breast MRI  on 03/24/21 which showed the 1.6 cm biopsy-proven malignancy within the far posterolateral ?right breast, abutting chest wall musculature. No other suspicious findings within either breast or abnormal ?appearing lymph nodes were appreciated.  ? ?On review of systems, the patient reports denies any pain within the right breast area nipple discharge or bleeding.  She does report some issues with lymphedema and is seeing physical therapy for this issue.Marland Kitchen  She will receive her last cycle of chemotherapy tomorrow. ? ?Allergies:  is allergic to nitrates, organic. ? ?Meds: ?Current Outpatient Medications  ?Medication Sig Dispense Refill  ? acetaminophen (TYLENOL) 325 MG tablet Take 1,000 mg by mouth every 6 (six) hours as needed.    ? cholecalciferol (VITAMIN D) 1000 UNITS tablet Take 1,000 Units by mouth daily.    ? lidocaine-prilocaine (EMLA) cream Apply to affected area once 30 g 3  ? loratadine (CLARITIN) 10 MG tablet Take 10 mg by mouth daily as needed for allergies.    ? LORazepam (ATIVAN) 0.5 MG tablet Take 1 tablet (0.5 mg total) by mouth at bedtime as needed (Nausea or vomiting). 30 tablet 0  ? Multiple Vitamin (MULTIVITAMIN) tablet Take 1 tablet by mouth daily.    ? Ascorbic Acid (VITAMIN C PO) Take 500 mg by mouth daily. (Patient not taking: Reported on 08/03/2021)    ? Calcium Citrate 1040 MG TABS Take 2,080 mg of elemental calcium by mouth daily. (Patient not taking: Reported on 08/03/2021)    ? fluticasone (FLONASE) 50 MCG/ACT nasal spray Place 1 spray into both nostrils daily as needed for allergies or rhinitis. (Patient not taking: Reported on 08/03/2021)    ? ondansetron (ZOFRAN) 8  MG tablet Take 1 tablet (8 mg total) by mouth 2 (two) times daily as needed (Nausea or vomiting). (Patient not taking: Reported on 08/03/2021) 30 tablet 1  ? prochlorperazine (COMPAZINE) 10 MG tablet Take 1 tablet (10 mg total) by mouth every 6 (six) hours as needed (Nausea or vomiting). (Patient not taking: Reported on 08/03/2021)  30 tablet 1  ? ?No current facility-administered medications for this encounter.  ? ? ?Physical Findings: ?The patient is in no acute distress. Patient is alert and oriented. ? height is _0  (1.651 m) and weight is 127 lb 3.2 oz (57.7 kg). Her temperature is 97.5 ?F (36.4 ?C) (abnormal). Her blood pressure is 118/74 and her pulse is 86. Her respiration is 20 and oxygen saturation is 99%.  No significant changes. Lungs are clear to auscultation bilaterally. Heart has regular rate and rhythm. No palpable cervical, supraclavicular, or axillary adenopathy. Abdomen soft, non-tender, normal bowel sounds. ?Left Breast: no palpable mass, nipple discharge or bleeding. ?Right Breast: Well-healed lumpectomy scar and axillary scar.  No dominant mass appreciated in the breast, nipple discharge or bleeding.  Some discoloration of the nipple and central breast area most likely from her sentinel node procedure. ? ?Lab Findings: ?Lab Results  ?Component Value Date  ? WBC 3.5 (L) 07/28/2021  ? HGB 11.3 (L) 07/28/2021  ? HCT 34.1 (L) 07/28/2021  ? MCV 94.2 07/28/2021  ? PLT 295 07/28/2021  ? ? ?Radiographic Findings: ?ECHOCARDIOGRAM COMPLETE ? ?Result Date: 07/10/2021 ?   ECHOCARDIOGRAM REPORT   Patient Name:   Susan Reid Date of Exam: 07/10/2021 Medical Rec #:  [id number]     Height:       65.0 in Accession #:    [id number]    Weight:       128.2 lb Date of Birth:  [birth year]     BSA:          1.638 m? Patient Age:    56 years      BP:           115/76 mmHg Patient Gender: F             HR:           74 bpm. Exam Location:  Outpatient Procedure: 2D Echo, Cardiac Doppler, Color Doppler and Strain Analysis Indications:    BREAST CA  History:        Patient has prior history of Echocardiogram examinations, most                 recent 04/05/2021. OSTEOPENIA.  Sonographer:    Beryle Beams Referring Phys: [id number] [name] IMPRESSIONS  1. Left ventricular ejection fraction, by estimation, is 60 to 65%. The left ventricle has normal  function. The left ventricle has no regional wall motion abnormalities. Left ventricular diastolic parameters were normal. The average left ventricular global longitudinal strain is -20.1 %. The global longitudinal strain is normal.  2. Right ventricular systolic function is normal. The right ventricular size is normal.  3. The mitral valve is normal in structure. No evidence of mitral valve regurgitation. No evidence of mitral stenosis.  4. The aortic valve is normal in structure. Aortic valve regurgitation is not visualized. No aortic stenosis is present.  5. The inferior vena cava is normal in size with greater than 50% respiratory variability, suggesting right atrial pressure of 3 mmHg. FINDINGS  Left Ventricle: Left ventricular ejection fraction, by estimation, is 60 to 65%. The left ventricle has normal function. The left  ventricle has no regional wall motion abnormalities. The average left ventricular global longitudinal strain is -20.1 %. The global longitudinal strain is normal. The left ventricular internal cavity size was normal in size. There is no left ventricular hypertrophy. Left ventricular diastolic parameters were normal. Right Ventricle: The right ventricular size is normal. No increase in right ventricular wall thickness. Right ventricular systolic function is normal. Left Atrium: Left atrial size was normal in size. Right Atrium: Right atrial size was normal in size. Pericardium: There is no evidence of pericardial effusion. Mitral Valve: The mitral valve is normal in structure. No evidence of mitral valve regurgitation. No evidence of mitral valve stenosis. Tricuspid Valve: The tricuspid valve is normal in structure. Tricuspid valve regurgitation is not demonstrated. No evidence of tricuspid stenosis. Aortic Valve: The aortic valve is normal in structure. Aortic valve regurgitation is not visualized. No aortic stenosis is present. Aortic valve mean gradient measures 4.0 mmHg. Aortic valve peak  gradient measures 6.2 mmHg. Aortic valve area, by VTI measures 2.07 cm?. Pulmonic Valve: The pulmonic valve was normal in structure. Pulmonic valve regurgitation is not visualized. No evidence of pulmon

## 2021-08-03 ENCOUNTER — Encounter: Payer: Self-pay | Admitting: Radiation Oncology

## 2021-08-03 ENCOUNTER — Other Ambulatory Visit: Payer: Self-pay

## 2021-08-03 ENCOUNTER — Ambulatory Visit
Admission: RE | Admit: 2021-08-03 | Discharge: 2021-08-03 | Disposition: A | Discharge status: Home / Self Care | Payer: Managed Care, Other (non HMO) | Source: Ambulatory Visit | Attending: Radiation Oncology | Admitting: Radiation Oncology

## 2021-08-03 VITALS — BP 118/74 | HR 86 | Temp 97.5°F | Resp 20 | Ht 65.0 in | Wt 127.2 lb

## 2021-08-03 DIAGNOSIS — C50411 Malignant neoplasm of upper-outer quadrant of right female breast: Secondary | ICD-10-CM | POA: Insufficient documentation

## 2021-08-03 DIAGNOSIS — I890 Lymphedema, not elsewhere classified: Secondary | ICD-10-CM | POA: Insufficient documentation

## 2021-08-03 DIAGNOSIS — Z9221 Personal history of antineoplastic chemotherapy: Secondary | ICD-10-CM | POA: Insufficient documentation

## 2021-08-03 DIAGNOSIS — Z170 Estrogen receptor positive status [ER+]: Secondary | ICD-10-CM

## 2021-08-03 DIAGNOSIS — Z79899 Other long term (current) drug therapy: Secondary | ICD-10-CM | POA: Insufficient documentation

## 2021-08-03 DIAGNOSIS — Z510 Encounter for antineoplastic radiation therapy: Secondary | ICD-10-CM | POA: Diagnosis present

## 2021-08-03 NOTE — Progress Notes (Signed)
See MD note for nursing evaluation. °

## 2021-08-04 ENCOUNTER — Encounter: Payer: Self-pay | Admitting: *Deleted

## 2021-08-04 ENCOUNTER — Inpatient Hospital Stay: Payer: Managed Care, Other (non HMO)

## 2021-08-04 VITALS — BP 116/76 | HR 81 | Resp 18 | Wt 128.5 lb

## 2021-08-04 DIAGNOSIS — Z170 Estrogen receptor positive status [ER+]: Secondary | ICD-10-CM

## 2021-08-04 DIAGNOSIS — Z5112 Encounter for antineoplastic immunotherapy: Secondary | ICD-10-CM | POA: Diagnosis not present

## 2021-08-04 DIAGNOSIS — Z95828 Presence of other vascular implants and grafts: Secondary | ICD-10-CM

## 2021-08-04 LAB — CMP (CANCER CENTER ONLY)
ALT: 33 U/L (ref 0–44)
AST: 24 U/L (ref 15–41)
Albumin: 4.2 g/dL (ref 3.5–5.0)
Alkaline Phosphatase: 73 U/L (ref 38–126)
Anion gap: 4 — ABNORMAL LOW (ref 5–15)
BUN: 15 mg/dL (ref 6–20)
CO2: 31 mmol/L (ref 22–32)
Calcium: 9.3 mg/dL (ref 8.9–10.3)
Chloride: 105 mmol/L (ref 98–111)
Creatinine: 0.76 mg/dL (ref 0.44–1.00)
GFR, Estimated: >60 mL/min (ref >60)
Glucose, Bld: 78 mg/dL (ref 70–99)
Potassium: 4.1 mmol/L (ref 3.5–5.1)
Sodium: 140 mmol/L (ref 135–145)
Total Bilirubin: 0.3 mg/dL (ref 0.3–1.2)
Total Protein: 6.5 g/dL (ref 6.5–8.1)

## 2021-08-04 LAB — CBC WITH DIFFERENTIAL (CANCER CENTER ONLY)
Abs Immature Granulocytes: 0.02 10*3/uL (ref 0.00–0.07)
Basophils Absolute: 0 10*3/uL (ref 0.0–0.1)
Basophils Relative: 1 %
Eosinophils Absolute: 0.2 10*3/uL (ref 0.0–0.5)
Eosinophils Relative: 4 %
HCT: 36.3 % (ref 36.0–46.0)
Hemoglobin: 11.9 g/dL — ABNORMAL LOW (ref 12.0–15.0)
Immature Granulocytes: 1 %
Lymphocytes Relative: 31 %
Lymphs Abs: 1.2 10*3/uL (ref 0.7–4.0)
MCH: 31 pg (ref 26.0–34.0)
MCHC: 32.8 g/dL (ref 30.0–36.0)
MCV: 94.5 fL (ref 80.0–100.0)
Monocytes Absolute: 0.5 10*3/uL (ref 0.1–1.0)
Monocytes Relative: 12 %
Neutro Abs: 2 10*3/uL (ref 1.7–7.7)
Neutrophils Relative %: 51 %
Platelet Count: 305 10*3/uL (ref 150–400)
RBC: 3.84 MIL/uL — ABNORMAL LOW (ref 3.87–5.11)
RDW: 14.5 % (ref 11.5–15.5)
WBC Count: 3.8 10*3/uL — ABNORMAL LOW (ref 4.0–10.5)
nRBC: 0 % (ref 0.0–0.2)

## 2021-08-04 MED ORDER — HEPARIN SOD (PORK) LOCK FLUSH 100 UNIT/ML IV SOLN
500.0000 [IU] | Freq: Once | INTRAVENOUS | Status: AC | PRN
  Administered 2021-08-04: 500 [IU]

## 2021-08-04 MED ORDER — SODIUM CHLORIDE 0.9 % IV SOLN
10.0000 mg | Freq: Once | INTRAVENOUS | Status: AC
  Administered 2021-08-04: 10 mg via INTRAVENOUS
  Filled 2021-08-04: qty 10

## 2021-08-04 MED ORDER — FAMOTIDINE IN NACL 20-0.9 MG/50ML-% IV SOLN
20.0000 mg | Freq: Once | INTRAVENOUS | Status: AC
  Administered 2021-08-04: 20 mg via INTRAVENOUS
  Filled 2021-08-04: qty 50

## 2021-08-04 MED ORDER — SODIUM CHLORIDE 0.9% FLUSH
10.0000 mL | Freq: Once | INTRAVENOUS | Status: AC
  Administered 2021-08-04: 10 mL

## 2021-08-04 MED ORDER — SODIUM CHLORIDE 0.9% FLUSH
10.0000 mL | INTRAVENOUS | Status: DC | PRN
  Administered 2021-08-04: 10 mL

## 2021-08-04 MED ORDER — TRASTUZUMAB-ANNS CHEMO 150 MG IV SOLR
6.0000 mg/kg | Freq: Once | INTRAVENOUS | Status: AC
  Administered 2021-08-04: 336 mg via INTRAVENOUS
  Filled 2021-08-04: qty 16

## 2021-08-04 MED ORDER — DIPHENHYDRAMINE HCL 50 MG/ML IJ SOLN
12.5000 mg | Freq: Once | INTRAMUSCULAR | Status: AC
  Administered 2021-08-04: 12.5 mg via INTRAVENOUS
  Filled 2021-08-04: qty 1

## 2021-08-04 MED ORDER — SODIUM CHLORIDE 0.9 % IV SOLN: Freq: Once | INTRAVENOUS | Status: AC

## 2021-08-04 MED ORDER — ACETAMINOPHEN 325 MG PO TABS
650.0000 mg | ORAL_TABLET | Freq: Once | ORAL | Status: AC
  Administered 2021-08-04: 650 mg via ORAL
  Filled 2021-08-04: qty 2

## 2021-08-04 MED ORDER — SODIUM CHLORIDE 0.9 % IV SOLN
80.0000 mg/m2 | Freq: Once | INTRAVENOUS | Status: AC
  Administered 2021-08-04: 126 mg via INTRAVENOUS
  Filled 2021-08-04: qty 21

## 2021-08-04 NOTE — Patient Instructions (Signed)
[facility]  Discharge Instructions: ?Thank you for choosing [facility] to provide your oncology and hematology care.  ? ?If you have a lab appointment with the [facility], please go directly to the [facility] and check in at the registration area. ?  ?Wear comfortable clothing and clothing appropriate for easy access to any Portacath or PICC line.  ? ?We strive to give you quality time with your provider. You may need to reschedule your appointment if you arrive late (15 or more minutes).  Arriving late affects you and other patients whose appointments are after yours.  Also, if you miss three or more appointments without notifying the office, you may be dismissed from the clinic at the provider?s discretion.    ?  ?For prescription refill requests, have your pharmacy contact our office and allow 72 hours for refills to be completed.   ? ?Today you received the following chemotherapy and/or immunotherapy agents: Trastuzumab, Paclitaxel.    ?  ?To help prevent nausea and vomiting after your treatment, we encourage you to take your nausea medication as directed. ? ?BELOW ARE SYMPTOMS THAT SHOULD BE REPORTED IMMEDIATELY: ?*FEVER GREATER THAN 100.4 F (38 ?C) OR HIGHER ?*CHILLS OR SWEATING ?*NAUSEA AND VOMITING THAT IS NOT CONTROLLED WITH YOUR NAUSEA MEDICATION ?*UNUSUAL SHORTNESS OF BREATH ?*UNUSUAL BRUISING OR BLEEDING ?*URINARY PROBLEMS (pain or burning when urinating, or frequent urination) ?*BOWEL PROBLEMS (unusual diarrhea, constipation, pain near the anus) ?TENDERNESS IN MOUTH AND THROAT WITH OR WITHOUT PRESENCE OF ULCERS (sore throat, sores in mouth, or a toothache) ?UNUSUAL RASH, SWELLING OR PAIN  ?UNUSUAL VAGINAL DISCHARGE OR ITCHING  ? ?Items with * indicate a potential emergency and should be followed up as soon as possible or go to the Emergency Department if any problems should occur. ? ?Please show the CHEMOTHERAPY ALERT CARD or IMMUNOTHERAPY ALERT CARD  at check-in to the Emergency Department and triage nurse. ? ?Should you have questions after your visit or need to cancel or reschedule your appointment, please contact [facility]  Dept: [phone]  and follow the prompts.  Office hours are 8:00 a.m. to 4:30 p.m. Monday - Friday. Please note that voicemails left after 4:00 p.m. may not be returned until the following business day.  We are closed weekends and major holidays. You have access to a nurse at all times for urgent questions. Please call the main number to the clinic Dept: [phone] and follow the prompts. ? ? ?For any non-urgent questions, you may also contact your provider using MyChart. We now offer e-Visits for anyone 89 and older to request care online for non-urgent symptoms. For details visit [email or web address]. ?  ?Also download the MyChart app! Go to the app store, search "MyChart", open the app, select [facility], and log in with your MyChart username and password. ? ?Due to Covid, a mask is required upon entering the hospital/clinic. If you do not have a mask, one will be given to you upon arrival. For doctor visits, patients may have 1 support [place] aged 61 or older with them. For treatment visits, patients cannot have anyone with them due to current Covid guidelines and our immunocompromised population.  ? ?

## 2021-08-09 ENCOUNTER — Ambulatory Visit: Payer: Managed Care, Other (non HMO)

## 2021-08-09 DIAGNOSIS — Z483 Aftercare following surgery for neoplasm: Secondary | ICD-10-CM

## 2021-08-09 DIAGNOSIS — Z170 Estrogen receptor positive status [ER+]: Secondary | ICD-10-CM

## 2021-08-09 DIAGNOSIS — M79601 Pain in right arm: Secondary | ICD-10-CM

## 2021-08-09 DIAGNOSIS — R600 Localized edema: Secondary | ICD-10-CM

## 2021-08-09 DIAGNOSIS — R293 Abnormal posture: Secondary | ICD-10-CM

## 2021-08-09 DIAGNOSIS — Z510 Encounter for antineoplastic radiation therapy: Secondary | ICD-10-CM | POA: Diagnosis not present

## 2021-08-09 NOTE — Therapy (Signed)
[facility] ?[facility] ?[place], [place], [address] ?Phone: [phone]   Fax:  [phone] ? ?Physical Therapy Treatment ? ?Patient Details  ?Name: Susan Reid ?MRN: [id number] ?Date of Birth: 1965/11/04 ?Referring Provider (PT): Dr. Stark Klein ? ? ?Encounter Date: 08/09/2021 ? ? PT End of Session - 08/09/21 1002   ? ? Visit Number 14   ? Number of Visits 21   ? Date for PT Re-Evaluation 08/23/21   ? PT Start Time 1002   ? PT Stop Time 1054   ? PT Time Calculation (min) 52 min   ? Activity Tolerance Patient tolerated treatment well   ? Behavior During Therapy [facility] for tasks assessed/performed   ? ?  ?  ? ?  ? ? ?Past Medical History:  ?Diagnosis Date  ? Allergy   ? Family history of adverse reaction to anesthesia   ? Mother got severe N/V and pneumonia following anesthesia  ? Osteopenia   ? ? ?Past Surgical History:  ?Procedure Laterality Date  ? AXILLARY SENTINEL NODE BIOPSY Right 04/20/2021  ? Procedure: RIGHT AXILLARY SENTINEL NODE BIOPSY;  Surgeon: Stark Klein, MD;  Location: [facility];  Service: General;  Laterality: Right;  ? BREAST BIOPSY Right 02/25/2020  ? [facility]  ? BREAST BIOPSY Right 03/14/2021  ? BREAST LUMPECTOMY WITH SENTINEL LYMPH NODE BIOPSY Right 04/20/2021  ? Procedure: RIGHT BREAST LUMPECTOMY;  Surgeon: Stark Klein, MD;  Location: [facility];  Service: General;  Laterality: Right;  ? PORTACATH PLACEMENT Left 04/20/2021  ? Procedure: INSERTION PORT-A-CATH;  Surgeon: Stark Klein, MD;  Location: [facility];  Service: General;  Laterality: Left;  ? WISDOM TOOTH EXTRACTION  04/24/1987  ? ? ?There were no vitals filed for this visit. ? ? Subjective Assessment - 08/09/21 1001   ? ? Subjective All my joints are achy from my last infusion, but that was the last one. I start radiation Next Wednesday. I wrapped at night and wore until 7 AM, and that helps. My hand swells when I work even with the gauntlet on   ? 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 She had a right lumpectomy and senitnel node biopsy (5 negative nodes) on 04/20/2021. It is ER positive, PR negative, and HER2 positive with a Ki67 of 80%.   ? Patient Stated Goals See if my arm is doing ok   ? Currently in Pain? No/denies   ? Pain Score 0-No pain   ? ?  ?  ? ?  ? ? ? ? ? ? ? ? ? ? ? ? ? ? ? ? ? ? ? ? [facility] Adult PT Treatment/Exercise - 08/09/21 0001   ? ?  ? Manual Therapy  ? Myofascial Release To Rt axilla, antecubital fossa  and upper arm during at areas of cording with longitudinal  technique   ? Manual Lymphatic Drainage (MLD) Therapist performed all steps and then had pt practice. short neck, 5 breaths bilateral axillary LN's and right inguinal LN's,Anterior interaxillary pathway, right axillo-inguinal pathway, Right upper arm lateral, medial to lateral and lateral arm again retracing pathways, then to forearm both sides and retracing pathways, then hand retracing pathways and ending with LN's She was given written instructions.   ? ?  ?  ? ?  ? ? ? ? ? ? ? ? ? ? ? ? ? ? ? PT Long Term Goals - 07/26/21 1715   ? ?  ?  PT LONG TERM GOAL #1  ? Title Patient will demonstrate she has regained full shoulder ROM and function post operatively compared to baselines.   ? Period Weeks   ? Status Achieved   ? Target Date 05/17/21   ?  ? PT LONG TERM GOAL #2  ? Title Patient will report >/= 50% less c/o tightness at end ROM right shoulder.   ? Time 4   ? Period Weeks   ? Status Achieved   ? Target Date 06/01/21   ?  ? PT LONG TERM GOAL #3  ? Title Patient will demonstrate >/= 50% reduction in visible cording (can compare photo under media tab)   ? Time 4   ? Period Weeks   ? Status Achieved   ? Target Date 06/01/21   ?  ? PT LONG TERM GOAL #4  ? Title Pt will be ind with final HEP for continued stretching   ? Period Weeks   ? Status On-going   ? Target Date 08/23/21   ?  ? PT LONG TERM GOAL #5  ? Title Pt will be independent with MLD  to the right UE   ? Time 4   ? Period Weeks   ? Status New   ? Target Date 08/23/21   ?  ? Additional Long Term Goals  ? Additional Long Term Goals Yes   ?  ? PT LONG TERM GOAL #6  ? Title Pt will be independent in compression bandaging to reduce right hand and UE edema   ? Time 4   ? Status New   ? Target Date 08/23/21   ?  ? PT LONG TERM GOAL #7  ? Title Pt will have decreased swelling at dorusmof hand on the right by 50%   ? Time 4   ? Period Weeks   ? Status Achieved   ? Target Date 08/23/21   ? ?  ?  ? ?  ? ? ? ? ? ? ? ? Plan - 08/09/21 1057   ? ? Clinical Impression Statement Pts hand reduces well with wrapping and increases during the day when in her sleeve and gauntle which is a class 1. She does not feel she can wear bandages and type all day and still type. We continued Manual lymphatic drainage and steps were reviewed with pt demonstrating.  A short period of MFR was also performed to right UE cording. Cords still present but not really bothering pt and much less prominent.  Pt has been set up for measuring with Sharrie Rothman next week for class 2 glove and sleeve, and demo sent to [facility] today to check coverage. She is seeing [place] next week and if not covered by [facility]iers may measure   ? Stability/Clinical Decision Making Stable/Uncomplicated   ? Rehab Potential Excellent   ? PT Frequency 2x / week   ? PT Duration 4 weeks   ? PT Treatment/Interventions ADLs/Self Care Home Management;Therapeutic exercise;Patient/family education;Manual techniques;Manual lymph drainage;Passive range of motion;Compression bandaging   ? PT Next Visit Plan Pt  on list to be measured by [facility] on Wed , but [place] sees on Tuesday. Marcene Brawn may want to measure for class 2 sleeve/glove,Review MLD, bandaging prn, remeasure, cording   ? PT Home Exercise Plan Post op HEP and neural stretching, supine scap series, MLD right UE   ? Consulted and Agree with Plan of Care Patient   ? ?  ?  ? ?  ? ? ?Patient will benefit  from skilled therapeutic  intervention in order to improve the following deficits and impairments:  Postural dysfunction, Decreased range of motion, Decreased knowledge of precautions, Impaired UE functional use, Pain, Increased fascial restricitons ? ?Visit Diagnosis: ?Aftercare following surgery for neoplasm ? ?Pain in right arm ? ?Abnormal posture ? ?Malignant neoplasm of upper-outer quadrant of right breast in female, estrogen receptor positive ([facility]) ? ?Localized edema ? ? ? ? ?Problem List ?Patient Active Problem List  ? Diagnosis Date Noted  ? Port-A-Cath in place 05/18/2021  ? Genetic testing 04/03/2021  ? Family history of breast cancer 03/23/2021  ? Malignant neoplasm of upper-outer quadrant of right breast in female, estrogen receptor positive ([facility]) 03/20/2021  ? Osteoporosis 12/10/2018  ? ? ?Claris Pong, PT ?08/09/2021, 11:02 AM ? ?[facility] ?[facility] ?[place], [place], [address] ?Phone: [phone]   Fax:  [phone] ? ?Name: SHANTERRIA FRANTA ?MRN: [id number] ?Date of Birth: 1966/03/21 ? ? ? ?

## 2021-08-10 ENCOUNTER — Encounter: Payer: Self-pay | Admitting: Hematology and Oncology

## 2021-08-15 ENCOUNTER — Encounter: Payer: Self-pay | Admitting: Rehabilitation

## 2021-08-15 ENCOUNTER — Ambulatory Visit: Payer: Managed Care, Other (non HMO) | Admitting: Rehabilitation

## 2021-08-15 DIAGNOSIS — Z483 Aftercare following surgery for neoplasm: Secondary | ICD-10-CM

## 2021-08-15 DIAGNOSIS — R600 Localized edema: Secondary | ICD-10-CM

## 2021-08-15 DIAGNOSIS — R293 Abnormal posture: Secondary | ICD-10-CM

## 2021-08-15 DIAGNOSIS — C50411 Malignant neoplasm of upper-outer quadrant of right female breast: Secondary | ICD-10-CM

## 2021-08-15 DIAGNOSIS — M79601 Pain in right arm: Secondary | ICD-10-CM

## 2021-08-15 NOTE — Therapy (Signed)
[facility] ?[facility] ?[place], [place], [address] ?Phone: [phone]   Fax:  [phone] ? ?Physical Therapy Treatment ? ?Patient Details  ?Name: Susan Reid ?MRN: [id number] ?Date of Birth: 1965/11/09 ?Referring Provider (PT): Dr. Stark Klein ? ? ?Encounter Date: 08/15/2021 ? ? ? ? ?Past Medical History:  ?Diagnosis Date  ? Allergy   ? Family history of adverse reaction to anesthesia   ? Mother got severe N/V and pneumonia following anesthesia  ? Osteopenia   ? ? ?Past Surgical History:  ?Procedure Laterality Date  ? AXILLARY SENTINEL NODE BIOPSY Right 04/20/2021  ? Procedure: RIGHT AXILLARY SENTINEL NODE BIOPSY;  Surgeon: Stark Klein, MD;  Location: [facility];  Service: General;  Laterality: Right;  ? BREAST BIOPSY Right 02/25/2020  ? [place]  ? BREAST BIOPSY Right 03/14/2021  ? BREAST LUMPECTOMY WITH SENTINEL LYMPH NODE BIOPSY Right 04/20/2021  ? Procedure: RIGHT BREAST LUMPECTOMY;  Surgeon: Stark Klein, MD;  Location: [facility];  Service: General;  Laterality: Right;  ? PORTACATH PLACEMENT Left 04/20/2021  ? Procedure: INSERTION PORT-A-CATH;  Surgeon: Stark Klein, MD;  Location: [facility];  Service: General;  Laterality: Left;  ? WISDOM TOOTH EXTRACTION  04/24/1987  ? ? ?There were no vitals filed for this visit. ? ? ? ? ? ? ? ? ? ? ? ? ? ? ? ? ? ? ? ? ? ? [facility] Adult PT Treatment/Exercise - 08/09/21 0001   ? ?  ? Manual Therapy  ? Myofascial Release To Rt axilla, antecubital fossa  and upper arm during at areas of cording with longitudinal  technique   ? Manual Lymphatic Drainage (MLD) Therapist performed all steps and then had pt practice. short neck, 5 breaths bilateral axillary LN's and right inguinal LN's,Anterior interaxillary pathway, right axillo-inguinal pathway, Right upper arm lateral, medial to lateral and lateral arm again retracing pathways, then to forearm both sides and retracing pathways, then hand retracing pathways and ending with LN's She  was given written instructions.   ? ?  ?  ? ?  ? ? ? ? ? ? ? ? ? ? ? ? ? ? ? PT Long Term Goals - 07/26/21 1715   ? ?  ? PT LONG TERM GOAL #1  ? Title Patient will demonstrate she has regained full shoulder ROM and function post operatively compared to baselines.   ? Period Weeks   ? Status Achieved   ? Target Date 05/17/21   ?  ? PT LONG TERM GOAL #2  ? Title Patient will report >/= 50% less c/o tightness at end ROM right shoulder.   ? Time 4   ? Period Weeks   ? Status Achieved   ? Target Date 06/01/21   ?  ? PT LONG TERM GOAL #3  ? Title Patient will demonstrate >/= 50% reduction in visible cording (can compare photo under media tab)   ? Time 4   ? Period Weeks   ? Status Achieved   ? Target Date 06/01/21   ?  ? PT LONG TERM GOAL #4  ? Title Pt will be ind with final HEP for continued stretching   ? Period Weeks   ? Status On-going   ? Target Date 08/23/21   ?  ? PT LONG TERM GOAL #5  ? Title Pt will be independent with MLD to the right UE   ? Time 4   ? Period Weeks   ? Status New   ?  Target Date 08/23/21   ?  ? Additional Long Term Goals  ? Additional Long Term Goals Yes   ?  ? PT LONG TERM GOAL #6  ? Title Pt will be independent in compression bandaging to reduce right hand and UE edema   ? Time 4   ? Status New   ? Target Date 08/23/21   ?  ? PT LONG TERM GOAL #7  ? Title Pt will have decreased swelling at dorusmof hand on the right by 50%   ? Time 4   ? Period Weeks   ? Status Achieved   ? Target Date 08/23/21   ? ?  ?  ? ?  ? ? ? ? ? ? ? ? ? ? ?Patient will benefit from skilled therapeutic intervention in order to improve the following deficits and impairments:    ? ?Visit Diagnosis: ?Aftercare following surgery for neoplasm ? ?Pain in right arm ? ?Abnormal posture ? ?Malignant neoplasm of upper-outer quadrant of right breast in female, estrogen receptor positive ([place]) ? ?Localized edema ? ? ? ? ?Problem List ?Patient Active Problem List  ? Diagnosis Date Noted  ? Port-A-Cath in place 05/18/2021  ? Genetic  testing 04/03/2021  ? Family history of breast cancer 03/23/2021  ? Malignant neoplasm of upper-outer quadrant of right breast in female, estrogen receptor positive ([place]) 03/20/2021  ? Osteoporosis 12/10/2018  ? ? ?Stark Bray, PT ?08/15/2021, 3:07 PM ? ?[facility] ?[facility] ?[place], [place], [address] ?Phone: [phone]   Fax:  [phone] ? ?Name: Susan Reid ?MRN: [id number] ?Date of Birth: 1966/02/03 ? ? ? ?

## 2021-08-16 ENCOUNTER — Other Ambulatory Visit: Payer: Self-pay

## 2021-08-16 ENCOUNTER — Ambulatory Visit
Admission: RE | Admit: 2021-08-16 | Discharge: 2021-08-16 | Disposition: A | Discharge status: Home / Self Care | Payer: Managed Care, Other (non HMO) | Source: Ambulatory Visit | Attending: Radiation Oncology | Admitting: Radiation Oncology

## 2021-08-16 DIAGNOSIS — Z510 Encounter for antineoplastic radiation therapy: Secondary | ICD-10-CM | POA: Diagnosis not present

## 2021-08-16 LAB — RAD ONC ARIA SESSION SUMMARY
Course Elapsed Days: 0
Plan Fractions Treated to Date: 1
Plan Prescribed Dose Per Fraction: 2.67 Gy
Plan Total Fractions Prescribed: 15
Plan Total Prescribed Dose: 40.05 Gy
Reference Point Dosage Given to Date: 2.67 Gy
Reference Point Session Dosage Given: 2.67 Gy
Session Number: 1

## 2021-08-17 ENCOUNTER — Other Ambulatory Visit: Payer: Self-pay

## 2021-08-17 ENCOUNTER — Ambulatory Visit
Admission: RE | Admit: 2021-08-17 | Discharge: 2021-08-17 | Disposition: A | Discharge status: Home / Self Care | Payer: Managed Care, Other (non HMO) | Source: Ambulatory Visit | Attending: Radiation Oncology | Admitting: Radiation Oncology

## 2021-08-17 ENCOUNTER — Encounter: Payer: Self-pay | Admitting: Hematology and Oncology

## 2021-08-17 DIAGNOSIS — Z510 Encounter for antineoplastic radiation therapy: Secondary | ICD-10-CM | POA: Diagnosis not present

## 2021-08-17 LAB — RAD ONC ARIA SESSION SUMMARY
Course Elapsed Days: 1
Plan Fractions Treated to Date: 2
Plan Prescribed Dose Per Fraction: 2.67 Gy
Plan Total Fractions Prescribed: 15
Plan Total Prescribed Dose: 40.05 Gy
Reference Point Dosage Given to Date: 5.34 Gy
Reference Point Session Dosage Given: 2.67 Gy
Session Number: 2

## 2021-08-17 NOTE — Telephone Encounter (Signed)
Spoke with patient about Radiaplex being given to patients at first undertreat appointment which is prior to when symptoms generally appear. Questions answered and patient verbalized understanding. ?

## 2021-08-18 ENCOUNTER — Ambulatory Visit
Admission: RE | Admit: 2021-08-18 | Discharge: 2021-08-18 | Disposition: A | Discharge status: Home / Self Care | Payer: Managed Care, Other (non HMO) | Source: Ambulatory Visit | Attending: Radiation Oncology | Admitting: Radiation Oncology

## 2021-08-18 ENCOUNTER — Other Ambulatory Visit: Payer: Self-pay

## 2021-08-18 DIAGNOSIS — Z510 Encounter for antineoplastic radiation therapy: Secondary | ICD-10-CM | POA: Diagnosis not present

## 2021-08-18 LAB — RAD ONC ARIA SESSION SUMMARY
Course Elapsed Days: 2
Plan Fractions Treated to Date: 3
Plan Prescribed Dose Per Fraction: 2.67 Gy
Plan Total Fractions Prescribed: 15
Plan Total Prescribed Dose: 40.05 Gy
Reference Point Dosage Given to Date: 8.01 Gy
Reference Point Session Dosage Given: 2.67 Gy
Session Number: 3

## 2021-08-21 ENCOUNTER — Ambulatory Visit: Payer: Managed Care, Other (non HMO) | Admitting: Radiation Oncology

## 2021-08-21 ENCOUNTER — Ambulatory Visit
Admission: RE | Admit: 2021-08-21 | Discharge: 2021-08-21 | Disposition: A | Discharge status: Home / Self Care | Payer: Managed Care, Other (non HMO) | Source: Ambulatory Visit | Attending: Radiation Oncology | Admitting: Radiation Oncology

## 2021-08-21 ENCOUNTER — Other Ambulatory Visit: Payer: Self-pay

## 2021-08-21 DIAGNOSIS — Z170 Estrogen receptor positive status [ER+]: Secondary | ICD-10-CM | POA: Diagnosis not present

## 2021-08-21 DIAGNOSIS — Z5112 Encounter for antineoplastic immunotherapy: Secondary | ICD-10-CM | POA: Diagnosis present

## 2021-08-21 DIAGNOSIS — Z452 Encounter for adjustment and management of vascular access device: Secondary | ICD-10-CM | POA: Diagnosis not present

## 2021-08-21 DIAGNOSIS — C50411 Malignant neoplasm of upper-outer quadrant of right female breast: Secondary | ICD-10-CM | POA: Diagnosis present

## 2021-08-21 LAB — RAD ONC ARIA SESSION SUMMARY
Course Elapsed Days: 5
Plan Fractions Treated to Date: 4
Plan Prescribed Dose Per Fraction: 2.67 Gy
Plan Total Fractions Prescribed: 15
Plan Total Prescribed Dose: 40.05 Gy
Reference Point Dosage Given to Date: 10.68 Gy
Reference Point Session Dosage Given: 2.67 Gy
Session Number: 4

## 2021-08-22 ENCOUNTER — Other Ambulatory Visit: Payer: Self-pay

## 2021-08-22 ENCOUNTER — Ambulatory Visit
Admission: RE | Admit: 2021-08-22 | Discharge: 2021-08-22 | Disposition: A | Discharge status: Home / Self Care | Payer: Managed Care, Other (non HMO) | Source: Ambulatory Visit | Attending: Radiation Oncology | Admitting: Radiation Oncology

## 2021-08-22 DIAGNOSIS — Z5112 Encounter for antineoplastic immunotherapy: Secondary | ICD-10-CM | POA: Diagnosis not present

## 2021-08-22 DIAGNOSIS — C50411 Malignant neoplasm of upper-outer quadrant of right female breast: Secondary | ICD-10-CM

## 2021-08-22 LAB — RAD ONC ARIA SESSION SUMMARY
Course Elapsed Days: 6
Plan Fractions Treated to Date: 5
Plan Prescribed Dose Per Fraction: 2.67 Gy
Plan Total Fractions Prescribed: 15
Plan Total Prescribed Dose: 40.05 Gy
Reference Point Dosage Given to Date: 13.35 Gy
Reference Point Session Dosage Given: 2.67 Gy
Session Number: 5

## 2021-08-22 MED ORDER — RADIAPLEXRX EX GEL: Freq: Once | CUTANEOUS | Status: AC

## 2021-08-22 MED ORDER — ALRA NON-METALLIC DEODORANT (RAD-ONC)
1.0000 "application " | Freq: Once | TOPICAL | Status: AC
  Administered 2021-08-22: 1 via TOPICAL

## 2021-08-22 NOTE — Progress Notes (Signed)
Pt here for patient teaching.    Pt given Radiation and You booklet, skin care instructions, Alra deodorant, and Radiaplex gel.    Reviewed areas of pertinence such as fatigue, hair loss in treatment field, skin changes, breast tenderness, and breast swelling .   Pt able to give teach back of to pat skin and use unscented/gentle soap,apply Radiaplex bid, avoid applying anything to skin within 4 hours of treatment, avoid wearing an under wire bra, and to use an electric razor if they must shave.   Pt verbalizes understanding of information given and will contact nursing with any questions or concerns.          

## 2021-08-23 ENCOUNTER — Other Ambulatory Visit: Payer: Self-pay

## 2021-08-23 ENCOUNTER — Ambulatory Visit: Payer: Managed Care, Other (non HMO) | Attending: General Surgery | Admitting: Rehabilitation

## 2021-08-23 ENCOUNTER — Encounter: Payer: Self-pay | Admitting: Rehabilitation

## 2021-08-23 ENCOUNTER — Ambulatory Visit
Admission: RE | Admit: 2021-08-23 | Discharge: 2021-08-23 | Disposition: A | Discharge status: Home / Self Care | Payer: Managed Care, Other (non HMO) | Source: Ambulatory Visit | Attending: Radiation Oncology | Admitting: Radiation Oncology

## 2021-08-23 DIAGNOSIS — Z483 Aftercare following surgery for neoplasm: Secondary | ICD-10-CM | POA: Diagnosis present

## 2021-08-23 DIAGNOSIS — Z170 Estrogen receptor positive status [ER+]: Secondary | ICD-10-CM | POA: Diagnosis present

## 2021-08-23 DIAGNOSIS — M79601 Pain in right arm: Secondary | ICD-10-CM | POA: Insufficient documentation

## 2021-08-23 DIAGNOSIS — R600 Localized edema: Secondary | ICD-10-CM | POA: Diagnosis present

## 2021-08-23 DIAGNOSIS — R293 Abnormal posture: Secondary | ICD-10-CM | POA: Diagnosis present

## 2021-08-23 DIAGNOSIS — C50411 Malignant neoplasm of upper-outer quadrant of right female breast: Secondary | ICD-10-CM | POA: Diagnosis present

## 2021-08-23 DIAGNOSIS — Z5112 Encounter for antineoplastic immunotherapy: Secondary | ICD-10-CM | POA: Diagnosis not present

## 2021-08-23 LAB — RAD ONC ARIA SESSION SUMMARY
Course Elapsed Days: 7
Plan Fractions Treated to Date: 6
Plan Prescribed Dose Per Fraction: 2.67 Gy
Plan Total Fractions Prescribed: 15
Plan Total Prescribed Dose: 40.05 Gy
Reference Point Dosage Given to Date: 16.02 Gy
Reference Point Session Dosage Given: 2.67 Gy
Session Number: 6

## 2021-08-23 NOTE — Therapy (Signed)
[facility] ?[facility] ?[place], [place], [address] ?Phone: [phone]   Fax:  [phone] ? ?Physical Therapy Treatment ? ?Patient Details  ?Name: Susan Reid ?MRN: [id number] ?Date of Birth: Oct 24, 1965 ?Referring Provider (PT): Dr. Stark Klein ? ? ?Encounter Date: 08/23/2021 ? ? PT End of Session - 08/23/21 1402   ? ? Visit Number 16   ? Number of Visits 21   ? Date for PT Re-Evaluation 08/23/21   ? PT Start Time 6962   ? PT Stop Time 1450   ? PT Time Calculation (min) 47 min   ? Activity Tolerance Patient tolerated treatment well   ? Behavior During Therapy [facility] for tasks assessed/performed   ? ?  ?  ? ?  ? ? ?Past Medical History:  ?Diagnosis Date  ? Allergy   ? Family history of adverse reaction to anesthesia   ? Mother got severe N/V and pneumonia following anesthesia  ? Osteopenia   ? ? ?Past Surgical History:  ?Procedure Laterality Date  ? AXILLARY SENTINEL NODE BIOPSY Right 04/20/2021  ? Procedure: RIGHT AXILLARY SENTINEL NODE BIOPSY;  Surgeon: Stark Klein, MD;  Location: [facility];  Service: General;  Laterality: Right;  ? BREAST BIOPSY Right 02/25/2020  ? [place]  ? BREAST BIOPSY Right 03/14/2021  ? BREAST LUMPECTOMY WITH SENTINEL LYMPH NODE BIOPSY Right 04/20/2021  ? Procedure: RIGHT BREAST LUMPECTOMY;  Surgeon: Stark Klein, MD;  Location: [facility];  Service: General;  Laterality: Right;  ? PORTACATH PLACEMENT Left 04/20/2021  ? Procedure: INSERTION PORT-A-CATH;  Surgeon: Stark Klein, MD;  Location: [facility];  Service: General;  Laterality: Left;  ? WISDOM TOOTH EXTRACTION  04/24/1987  ? ? ?There were no vitals filed for this visit. ? ? Subjective Assessment - 08/23/21 1403   ? ? Subjective i have had 6 radiations.  it is going well.  I think the swelling is down.   ? 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 She had a right lumpectomy and senitnel node biopsy (5 negative  nodes) on 04/20/2021. It is ER positive, PR negative, and HER2 positive with a Ki67 of 80%.   ? Currently in Pain? No/denies   ? ?  ?  ? ?  ? ? ? ? ? ? ? ? ? ? ? ? ? ? ? ? ? ? ? ? [facility] Adult PT Treatment/Exercise - 08/23/21 0001   ? ?  ? Manual Therapy  ? Myofascial Release To Rt axilla, antecubital fossa  and upper arm during at areas of cording with longitudinal  technique   ? Manual Lymphatic Drainage (MLD) Therapist performed all steps. short neck, 5 breaths bilateral axillary LN's and right inguinal LN's,Anterior interaxillary pathway, right axillo-inguinal pathway, Right upper arm lateral, medial to lateral and lateral arm again retracing pathways, then to forearm both sides and retracing pathways, then hand retracing pathways and ending with LN's   ? Passive ROM In Supine to Rt shoulder into flexion, abduction, ER   ? ?  ?  ? ?  ? ? ? ? ? ? ? ? ? ? ? ? ? ? ? PT Long Term Goals - 07/26/21 1715   ? ?  ? PT LONG TERM GOAL #1  ? Title Patient will demonstrate she has regained full shoulder ROM and function post operatively compared to baselines.   ? Period Weeks   ? Status Achieved   ? Target  Date 05/17/21   ?  ? PT LONG TERM GOAL #2  ? Title Patient will report >/= 50% less c/o tightness at end ROM right shoulder.   ? Time 4   ? Period Weeks   ? Status Achieved   ? Target Date 06/01/21   ?  ? PT LONG TERM GOAL #3  ? Title Patient will demonstrate >/= 50% reduction in visible cording (can compare photo under media tab)   ? Time 4   ? Period Weeks   ? Status Achieved   ? Target Date 06/01/21   ?  ? PT LONG TERM GOAL #4  ? Title Pt will be ind with final HEP for continued stretching   ? Period Weeks   ? Status On-going   ? Target Date 08/23/21   ?  ? PT LONG TERM GOAL #5  ? Title Pt will be independent with MLD to the right UE   ? Time 4   ? Period Weeks   ? Status New   ? Target Date 08/23/21   ?  ? Additional Long Term Goals  ? Additional Long Term Goals Yes   ?  ? PT LONG TERM GOAL #6  ? Title Pt will be  independent in compression bandaging to reduce right hand and UE edema   ? Time 4   ? Status New   ? Target Date 08/23/21   ?  ? PT LONG TERM GOAL #7  ? Title Pt will have decreased swelling at dorusmof hand on the right by 50%   ? Time 4   ? Period Weeks   ? Status Achieved   ? Target Date 08/23/21   ? ?  ?  ? ?  ? ? ? ? ? ? ? ? Plan - 08/23/21 1451   ? ? Clinical Impression Statement Pt is having less swelling now done with chemotherapy for a bit longer.  Still has some hand/thumb puffiness but reports she didnt even wear the sleeve today bc she didn't think it was needed.  Cording still visible and palpable in axilla and antecubital fossa.   ? PT Frequency 1x / week   ? PT Duration 2 weeks   ? PT Treatment/Interventions ADLs/Self Care Home Management;Therapeutic exercise;Patient/family education;Manual techniques;Manual lymph drainage;Passive range of motion;Compression bandaging   ? PT Next Visit Plan schedule [facility] out 1 month vs 3 months? pt ready for DC with receipt of well fitting sleeve and knowing that the new sleeve is working.   ? Consulted and Agree with Plan of Care Patient   ? ?  ?  ? ?  ? ? ?Patient will benefit from skilled therapeutic intervention in order to improve the following deficits and impairments:    ? ?Visit Diagnosis: ?Aftercare following surgery for neoplasm ? ?Pain in right arm ? ?Abnormal posture ? ?Malignant neoplasm of upper-outer quadrant of right breast in female, estrogen receptor positive ([place]) ? ?Localized edema ? ? ? ? ?Problem List ?Patient Active Problem List  ? Diagnosis Date Noted  ? Port-A-Cath in place 05/18/2021  ? Genetic testing 04/03/2021  ? Family history of breast cancer 03/23/2021  ? Malignant neoplasm of upper-outer quadrant of right breast in female, estrogen receptor positive ([facility]) 03/20/2021  ? Osteoporosis 12/10/2018  ? ? ?Stark Bray, PT ?08/23/2021, 2:53 PM ? ?[facility] ?[facility] ?[place],  [place], [address] ?Phone: [phone]   Fax:  [phone] ? ?Name: ANNAJULIA LEWING ?  MRN: [id number] ?Date of Birth: 1965-12-03 ? ? ? ?

## 2021-08-24 ENCOUNTER — Ambulatory Visit
Admission: RE | Admit: 2021-08-24 | Discharge: 2021-08-24 | Disposition: A | Discharge status: Home / Self Care | Payer: Managed Care, Other (non HMO) | Source: Ambulatory Visit | Attending: Radiation Oncology | Admitting: Radiation Oncology

## 2021-08-24 ENCOUNTER — Other Ambulatory Visit: Payer: Self-pay

## 2021-08-24 DIAGNOSIS — Z5112 Encounter for antineoplastic immunotherapy: Secondary | ICD-10-CM | POA: Diagnosis not present

## 2021-08-24 LAB — RAD ONC ARIA SESSION SUMMARY
Course Elapsed Days: 8
Plan Fractions Treated to Date: 7
Plan Prescribed Dose Per Fraction: 2.67 Gy
Plan Total Fractions Prescribed: 15
Plan Total Prescribed Dose: 40.05 Gy
Reference Point Dosage Given to Date: 18.69 Gy
Reference Point Session Dosage Given: 2.67 Gy
Session Number: 7

## 2021-08-25 ENCOUNTER — Ambulatory Visit
Admission: RE | Admit: 2021-08-25 | Discharge: 2021-08-25 | Disposition: A | Discharge status: Home / Self Care | Payer: Managed Care, Other (non HMO) | Source: Ambulatory Visit | Attending: Radiation Oncology | Admitting: Radiation Oncology

## 2021-08-25 ENCOUNTER — Other Ambulatory Visit: Payer: Self-pay

## 2021-08-25 ENCOUNTER — Inpatient Hospital Stay: Payer: Managed Care, Other (non HMO) | Attending: Hematology and Oncology

## 2021-08-25 VITALS — BP 114/67 | HR 76 | Temp 97.8°F | Resp 18 | Wt 130.5 lb

## 2021-08-25 DIAGNOSIS — Z170 Estrogen receptor positive status [ER+]: Secondary | ICD-10-CM | POA: Insufficient documentation

## 2021-08-25 DIAGNOSIS — Z452 Encounter for adjustment and management of vascular access device: Secondary | ICD-10-CM | POA: Insufficient documentation

## 2021-08-25 DIAGNOSIS — Z5112 Encounter for antineoplastic immunotherapy: Secondary | ICD-10-CM | POA: Diagnosis not present

## 2021-08-25 DIAGNOSIS — C50411 Malignant neoplasm of upper-outer quadrant of right female breast: Secondary | ICD-10-CM | POA: Insufficient documentation

## 2021-08-25 LAB — RAD ONC ARIA SESSION SUMMARY
Course Elapsed Days: 9
Plan Fractions Treated to Date: 8
Plan Prescribed Dose Per Fraction: 2.67 Gy
Plan Total Fractions Prescribed: 15
Plan Total Prescribed Dose: 40.05 Gy
Reference Point Dosage Given to Date: 21.36 Gy
Reference Point Session Dosage Given: 2.67 Gy
Session Number: 8

## 2021-08-25 MED ORDER — ACETAMINOPHEN 325 MG PO TABS
650.0000 mg | ORAL_TABLET | Freq: Once | ORAL | Status: AC
  Administered 2021-08-25: 650 mg via ORAL
  Filled 2021-08-25: qty 2

## 2021-08-25 MED ORDER — HEPARIN SOD (PORK) LOCK FLUSH 100 UNIT/ML IV SOLN
500.0000 [IU] | Freq: Once | INTRAVENOUS | Status: AC | PRN
  Administered 2021-08-25: 500 [IU]

## 2021-08-25 MED ORDER — SODIUM CHLORIDE 0.9% FLUSH
10.0000 mL | INTRAVENOUS | Status: DC | PRN
  Administered 2021-08-25: 10 mL

## 2021-08-25 MED ORDER — SODIUM CHLORIDE 0.9 % IV SOLN: Freq: Once | INTRAVENOUS | Status: AC

## 2021-08-25 MED ORDER — TRASTUZUMAB-ANNS CHEMO 150 MG IV SOLR
6.0000 mg/kg | Freq: Once | INTRAVENOUS | Status: AC
  Administered 2021-08-25: 336 mg via INTRAVENOUS
  Filled 2021-08-25: qty 16

## 2021-08-25 MED ORDER — DIPHENHYDRAMINE HCL 25 MG PO CAPS
50.0000 mg | ORAL_CAPSULE | Freq: Once | ORAL | Status: AC
  Administered 2021-08-25: 50 mg via ORAL
  Filled 2021-08-25: qty 2

## 2021-08-25 NOTE — Patient Instructions (Signed)
[facility]  Discharge Instructions: Thank you for choosing [facility] to provide your oncology and hematology care.   If you have a lab appointment with the Cancer Center, please go directly to the Cancer Center and check in at the registration area.   Wear comfortable clothing and clothing appropriate for easy access to any Portacath or PICC line.   We strive to give you quality time with your provider. You may need to reschedule your appointment if you arrive late (15 or more minutes).  Arriving late affects you and other patients whose appointments are after yours.  Also, if you miss three or more appointments without notifying the office, you may be dismissed from the clinic at the provider's discretion.      For prescription refill requests, have your pharmacy contact our office and allow 72 hours for refills to be completed.    Today you received the following chemotherapy and/or immunotherapy agents trastuzumab      To help prevent nausea and vomiting after your treatment, we encourage you to take your nausea medication as directed.  BELOW ARE SYMPTOMS THAT SHOULD BE REPORTED IMMEDIATELY: *FEVER GREATER THAN 100.4 F (38 C) OR HIGHER *CHILLS OR SWEATING *NAUSEA AND VOMITING THAT IS NOT CONTROLLED WITH YOUR NAUSEA MEDICATION *UNUSUAL SHORTNESS OF BREATH *UNUSUAL BRUISING OR BLEEDING *URINARY PROBLEMS (pain or burning when urinating, or frequent urination) *BOWEL PROBLEMS (unusual diarrhea, constipation, pain near the anus) TENDERNESS IN MOUTH AND THROAT WITH OR WITHOUT PRESENCE OF ULCERS (sore throat, sores in mouth, or a toothache) UNUSUAL RASH, SWELLING OR PAIN  UNUSUAL VAGINAL DISCHARGE OR ITCHING   Items with * indicate a potential emergency and should be followed up as soon as possible or go to the Emergency Department if any problems should occur.  Please show the CHEMOTHERAPY ALERT CARD or IMMUNOTHERAPY ALERT CARD at check-in to  the Emergency Department and triage nurse.  Should you have questions after your visit or need to cancel or reschedule your appointment, please contact [facility]  Dept: [phone]  and follow the prompts.  Office hours are 8:00 a.m. to 4:30 p.m. Monday - Friday. Please note that voicemails left after 4:00 p.m. may not be returned until the following business day.  We are closed weekends and major holidays. You have access to a nurse at all times for urgent questions. Please call the main number to the clinic Dept: [phone] and follow the prompts.   For any non-urgent questions, you may also contact your provider using MyChart. We now offer e-Visits for anyone 18 and older to request care online for non-urgent symptoms. For details visit [email or web address].   Also download the MyChart app! Go to the app store, search "MyChart", open the app, select [facility], and log in with your MyChart username and password.  Due to Covid, a mask is required upon entering the hospital/clinic. If you do not have a mask, one will be given to you upon arrival. For doctor visits, patients may have 1 support person aged 18 or older with them. For treatment visits, patients cannot have anyone with them due to current Covid guidelines and our immunocompromised population.   

## 2021-08-25 NOTE — Progress Notes (Signed)
Ok to treat without labs contrary to treatment parameters per Dr Lindi Adie  ?

## 2021-08-28 ENCOUNTER — Ambulatory Visit
Admission: RE | Admit: 2021-08-28 | Discharge: 2021-08-28 | Disposition: A | Discharge status: Home / Self Care | Payer: Managed Care, Other (non HMO) | Source: Ambulatory Visit | Attending: Radiation Oncology | Admitting: Radiation Oncology

## 2021-08-28 ENCOUNTER — Other Ambulatory Visit: Payer: Self-pay

## 2021-08-28 ENCOUNTER — Other Ambulatory Visit: Payer: Self-pay | Admitting: *Deleted

## 2021-08-28 DIAGNOSIS — C50411 Malignant neoplasm of upper-outer quadrant of right female breast: Secondary | ICD-10-CM

## 2021-08-28 DIAGNOSIS — Z5112 Encounter for antineoplastic immunotherapy: Secondary | ICD-10-CM | POA: Diagnosis not present

## 2021-08-28 LAB — RAD ONC ARIA SESSION SUMMARY
Course Elapsed Days: 12
Plan Fractions Treated to Date: 9
Plan Prescribed Dose Per Fraction: 2.67 Gy
Plan Total Fractions Prescribed: 15
Plan Total Prescribed Dose: 40.05 Gy
Reference Point Dosage Given to Date: 24.03 Gy
Reference Point Session Dosage Given: 2.67 Gy
Session Number: 9

## 2021-08-29 ENCOUNTER — Ambulatory Visit
Admission: RE | Admit: 2021-08-29 | Discharge: 2021-08-29 | Disposition: A | Discharge status: Home / Self Care | Payer: Managed Care, Other (non HMO) | Source: Ambulatory Visit | Attending: Radiation Oncology | Admitting: Radiation Oncology

## 2021-08-29 ENCOUNTER — Inpatient Hospital Stay: Payer: Managed Care, Other (non HMO)

## 2021-08-29 ENCOUNTER — Inpatient Hospital Stay (HOSPITAL_BASED_OUTPATIENT_CLINIC_OR_DEPARTMENT_OTHER): Payer: Managed Care, Other (non HMO) | Admitting: Adult Health

## 2021-08-29 ENCOUNTER — Encounter: Payer: Self-pay | Admitting: Adult Health

## 2021-08-29 ENCOUNTER — Other Ambulatory Visit: Payer: Self-pay

## 2021-08-29 VITALS — BP 139/75 | HR 76 | Temp 97.5°F | Resp 18 | Ht 65.0 in | Wt 127.0 lb

## 2021-08-29 DIAGNOSIS — Z95828 Presence of other vascular implants and grafts: Secondary | ICD-10-CM

## 2021-08-29 DIAGNOSIS — Z170 Estrogen receptor positive status [ER+]: Secondary | ICD-10-CM

## 2021-08-29 DIAGNOSIS — Z5112 Encounter for antineoplastic immunotherapy: Secondary | ICD-10-CM | POA: Diagnosis not present

## 2021-08-29 DIAGNOSIS — C50411 Malignant neoplasm of upper-outer quadrant of right female breast: Secondary | ICD-10-CM

## 2021-08-29 DIAGNOSIS — R339 Retention of urine, unspecified: Secondary | ICD-10-CM

## 2021-08-29 LAB — CBC WITH DIFFERENTIAL (CANCER CENTER ONLY)
Abs Immature Granulocytes: 0.01 10*3/uL (ref 0.00–0.07)
Basophils Absolute: 0 10*3/uL (ref 0.0–0.1)
Basophils Relative: 1 %
Eosinophils Absolute: 0.2 10*3/uL (ref 0.0–0.5)
Eosinophils Relative: 4 %
HCT: 38.2 % (ref 36.0–46.0)
Hemoglobin: 12.4 g/dL (ref 12.0–15.0)
Immature Granulocytes: 0 %
Lymphocytes Relative: 19 %
Lymphs Abs: 1.1 10*3/uL (ref 0.7–4.0)
MCH: 31.1 pg (ref 26.0–34.0)
MCHC: 32.5 g/dL (ref 30.0–36.0)
MCV: 95.7 fL (ref 80.0–100.0)
Monocytes Absolute: 0.5 10*3/uL (ref 0.1–1.0)
Monocytes Relative: 9 %
Neutro Abs: 4 10*3/uL (ref 1.7–7.7)
Neutrophils Relative %: 67 %
Platelet Count: 319 10*3/uL (ref 150–400)
RBC: 3.99 MIL/uL (ref 3.87–5.11)
RDW: 13.6 % (ref 11.5–15.5)
WBC Count: 5.9 10*3/uL (ref 4.0–10.5)
nRBC: 0 % (ref 0.0–0.2)

## 2021-08-29 LAB — URINALYSIS, COMPLETE (UACMP) WITH MICROSCOPIC
Bacteria, UA: NONE SEEN
Bilirubin Urine: NEGATIVE
Glucose, UA: NEGATIVE mg/dL
Hgb urine dipstick: NEGATIVE
Ketones, ur: NEGATIVE mg/dL
Nitrite: NEGATIVE
Protein, ur: NEGATIVE mg/dL
Specific Gravity, Urine: 1.005 (ref 1.005–1.030)
pH: 7 (ref 5.0–8.0)

## 2021-08-29 LAB — CMP (CANCER CENTER ONLY)
ALT: 33 U/L (ref 0–44)
AST: 32 U/L (ref 15–41)
Albumin: 4.5 g/dL (ref 3.5–5.0)
Alkaline Phosphatase: 78 U/L (ref 38–126)
Anion gap: 10 (ref 5–15)
BUN: 17 mg/dL (ref 6–20)
CO2: 25 mmol/L (ref 22–32)
Calcium: 9.4 mg/dL (ref 8.9–10.3)
Chloride: 105 mmol/L (ref 98–111)
Creatinine: 0.71 mg/dL (ref 0.44–1.00)
GFR, Estimated: >60 mL/min (ref >60)
Glucose, Bld: 87 mg/dL (ref 70–99)
Potassium: 4.1 mmol/L (ref 3.5–5.1)
Sodium: 140 mmol/L (ref 135–145)
Total Bilirubin: 0.7 mg/dL (ref 0.3–1.2)
Total Protein: 7.1 g/dL (ref 6.5–8.1)

## 2021-08-29 LAB — RAD ONC ARIA SESSION SUMMARY
Course Elapsed Days: 13
Plan Fractions Treated to Date: 10
Plan Prescribed Dose Per Fraction: 2.67 Gy
Plan Total Fractions Prescribed: 15
Plan Total Prescribed Dose: 40.05 Gy
Reference Point Dosage Given to Date: 26.7 Gy
Reference Point Session Dosage Given: 2.67 Gy
Session Number: 10

## 2021-08-29 MED ORDER — SODIUM CHLORIDE 0.9% FLUSH
10.0000 mL | Freq: Once | INTRAVENOUS | Status: AC
  Administered 2021-08-29: 10 mL

## 2021-08-29 MED ORDER — HEPARIN SOD (PORK) LOCK FLUSH 100 UNIT/ML IV SOLN
500.0000 [IU] | Freq: Once | INTRAVENOUS | Status: AC
  Administered 2021-08-29: 500 [IU]

## 2021-08-29 NOTE — Progress Notes (Signed)
[facility] Follow up: ?  ? ?Susan Noe, MD ?[place] [address] ? ? ?DIAGNOSIS:  Cancer Staging  ?Malignant neoplasm of upper-outer quadrant of right breast in female, estrogen receptor positive ([place]) ?Staging form: Breast, AJCC 8th Edition ?- Clinical stage from 03/22/2021: Stage IA (cT1b, cN0, cM0, G3, ER+, PR-, HER2+) - Signed by [name], MD on 03/22/2021 ?Stage prefix: Initial diagnosis ?Histologic grading system: 3 grade system ?- Pathologic stage from 04/20/2021: Stage IA (pT1c, pN0, cM0, G3, ER+, PR-, HER2+) - Signed by [name], NP on 06/30/2021 ?Stage prefix: Initial diagnosis ?Histologic grading system: 3 grade system ? ? ?SUMMARY OF ONCOLOGIC HISTORY: ?Oncology History  ?Malignant neoplasm of upper-outer quadrant of right breast in female, estrogen receptor positive ([place])  ?03/14/2021 Initial Diagnosis  ? Palpable right breast mass diagnostic mammogram: 1.6 cm mass in the right breast at 9 o'clock. 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  ?  ?03/22/2021 Cancer Staging  ? Staging form: Breast, AJCC 8th Edition ?- Clinical stage from 03/22/2021: Stage IA (cT1b, cN0, cM0, G3, ER+, PR-, HER2+) - Signed by [name], MD on 03/22/2021 ?Stage prefix: Initial diagnosis ?Histologic grading system: 3 grade system ? ?  ? Genetic Testing  ? [facility] CustomNext Panel was Negative. Report date is 04/03/2021. ? ?The CustomNext-Cancer+RNAinsight panel offered by [facility] includes sequencing and rearrangement analysis for the following 47 genes:  APC, ATM, AXIN2, BARD1, BMPR1A, BRCA1, BRCA2, BRIP1, CDH1, CDK4, CDKN2A, CHEK2, CTNNA1, DICER1, EPCAM, GREM1, HOXB13, KIT, MEN1, MLH1, MSH2, MSH3, MSH6, MUTYH, NBN, NF1, NTHL1, PALB2, PDGFRA, PMS2, POLD1, POLE, PTEN, RAD50, RAD51C, RAD51D, SDHA, SDHB, SDHC, SDHD, SMAD4, SMARCA4, STK11, TP53, TSC1, TSC2, and VHL.  RNA data is routinely analyzed  for use in variant interpretation for all genes. ?  ?04/20/2021 Surgery  ? Rt Lumpectomy: Grade 3 IDC 1.9 cm 0/5 Ln neg, Margins Neg, Her2+, Copy #5.85, ratio 3.16 ER+(60%)/PR+(0%), Ki-67 80% ?  ?04/20/2021 Cancer Staging  ? Staging form: Breast, AJCC 8th Edition ?- Pathologic stage from 04/20/2021: Stage IA (pT1c, pN0, cM0, G3, ER+, PR-, HER2+) - Signed by [name], NP on 06/30/2021 ?Stage prefix: Initial diagnosis ?Histologic grading system: 3 grade system ? ?  ?05/18/2021 -  Chemotherapy  ? Patient is on Treatment Plan : BREAST Paclitaxel + Trastuzumab q7d / Trastuzumab q21d  ? ?   ? ? ?CURRENT THERAPY: Herceptin, adjuvant radiation ? ?INTERVAL HISTORY: ?Susan Reid 56 y.o. female returns for evaluation of bladder concerns.  She notes that since surgery and chemotherapy she has developed increased difficulty with bladder emptying.  Last Friday evening she started to feel a bulging feeling.  She has been doing kegel exercises and notes some relief.  She is concerned about the potential for future issues related to this.   ? ? ?Patient Active Problem List  ? Diagnosis Date Noted  ? Port-A-Cath in place 05/18/2021  ? Genetic testing 04/03/2021  ? Family history of breast cancer 03/23/2021  ? Malignant neoplasm of upper-outer quadrant of right breast in female, estrogen receptor positive ([place]) 03/20/2021  ? Osteoporosis 12/10/2018  ? ? ?is allergic to nitrates, organic. ? ?MEDICAL HISTORY: ?Past Medical History:  ?Diagnosis Date  ? Allergy   ? Family history of adverse reaction to anesthesia   ? Mother got severe N/V and pneumonia following anesthesia  ? Osteopenia   ? ? ?SURGICAL HISTORY: ?Past Surgical History:  ?Procedure Laterality  Date  ? AXILLARY SENTINEL NODE BIOPSY Right 04/20/2021  ? Procedure: RIGHT AXILLARY SENTINEL NODE BIOPSY;  Surgeon: Stark Klein, MD;  Location: [facility];  Service: General;  Laterality: Right;  ? BREAST BIOPSY Right 02/25/2020  ? [place]  ? BREAST BIOPSY Right 03/14/2021  ?  BREAST LUMPECTOMY WITH SENTINEL LYMPH NODE BIOPSY Right 04/20/2021  ? Procedure: RIGHT BREAST LUMPECTOMY;  Surgeon: Stark Klein, MD;  Location: [facility];  Service: General;  Laterality: Right;  ? PORTACATH PLACEMENT Left 04/20/2021  ? Procedure: INSERTION PORT-A-CATH;  Surgeon: Stark Klein, MD;  Location: [facility];  Service: General;  Laterality: Left;  ? WISDOM TOOTH EXTRACTION  04/24/1987  ? ? ?SOCIAL HISTORY: ?Social History  ? ?Socioeconomic History  ? Marital status: Married  ?  Spouse name: [name]  ? Number of children: 2  ? Years of education: High school  ? Highest education level: Not on file  ?Occupational History  ? Not on file  ?Tobacco Use  ? Smoking status: Never  ? Smokeless tobacco: Never  ?Vaping Use  ? Vaping Use: Never used  ?Substance and Sexual Activity  ? Alcohol use: Yes  ?  Comment: one a month or less  ? Drug use: No  ? Sexual activity: Yes  ?  Birth control/protection: Post-menopausal  ?Other Topics Concern  ? Not on file  ?Social History Narrative  ? 10/28/19  ? From: the area  ? Living: with Husband, [name]  ? Work: Nature conservation officer  ?   ? Family: 2 children - [name] (nurse at [employer]) and [name] (family medicine resident in [place]) - both married 2020 and 2021  ?   ? Enjoys: work outside in the yard, hiking  ?   ? Exercise: walking daily, was going to the gym  ? Diet: diabetic diet - avoiding added sugar  ?   ? Safety  ? Seat belts: Yes   ? Guns: Yes  and secure  ? Safe in relationships: Yes   ? ?Social Determinants of Health  ? ?Financial Resource Strain: Not on file  ?Food Insecurity: Not on file  ?Transportation Needs: Not on file  ?Physical Activity: Not on file  ?Stress: Not on file  ?Social Connections: Not on file  ?Intimate Partner Violence: Not on file  ? ? ?FAMILY HISTORY: ?Family History  ?Problem Relation Age of Onset  ? Arthritis Mother   ? Hyperlipidemia Mother   ? Heart disease Mother   ? Stroke Mother 86  ? Hypertension Mother   ? Kidney  disease Mother   ? Breast cancer Mother 43  ? Cirrhosis Mother   ? Mental illness Father   ? Depression Father   ? Lung cancer Maternal Aunt 10  ? Lung cancer Maternal Uncle 75  ? Cancer Maternal Grandfather   ?     Oral  - smoker  ? Lung cancer Maternal Grandfather   ? Cancer Paternal Grandmother   ?     unknown type  ? Prostate cancer Paternal Grandfather   ? Breast cancer Maternal Great-grandmother   ? Colon cancer Neg Hx   ? Colon polyps Neg Hx   ? Esophageal cancer Neg Hx   ? Rectal cancer Neg Hx   ? Stomach cancer Neg Hx   ? ? ?Review of Systems  ?Constitutional:  Negative for appetite change, chills, fatigue, fever and unexpected weight change.  ?HENT:   Negative for hearing loss, lump/mass and trouble swallowing.   ?Eyes:  Negative for eye problems and icterus.  ?Respiratory:  Negative for chest tightness, cough and shortness of breath.   ?Cardiovascular:  Negative for chest pain, leg swelling and palpitations.  ?Gastrointestinal:  Negative for abdominal distention, abdominal pain, constipation, diarrhea, nausea and vomiting.  ?Endocrine: Negative for hot flashes.  ?Genitourinary:  Positive for frequency. Negative for difficulty urinating.   ?Musculoskeletal:  Negative for arthralgias.  ?Skin:  Negative for itching and rash.  ?Neurological:  Negative for dizziness, extremity weakness, headaches and numbness.  ?Hematological:  Negative for adenopathy. Does not bruise/bleed easily.  ?Psychiatric/Behavioral:  Negative for depression. The patient is not nervous/anxious.    ? ? ?PHYSICAL EXAMINATION ? ?ECOG PERFORMANCE STATUS: 1 - Symptomatic but completely ambulatory ? ?Vitals:  ? 08/29/21 1252  ?BP: 139/75  ?Pulse: 76  ?Resp: 18  ?Temp: (!) 97.5 ?F (36.4 ?C)  ?SpO2: 100%  ? ? ?Physical Exam ?Constitutional:   ?   General: She is not in acute distress. ?   Appearance: Normal appearance. She is not toxic-appearing.  ?HENT:  ?   Head: Normocephalic and atraumatic.  ?Eyes:  ?   General: No scleral  icterus. ?Cardiovascular:  ?   Rate and Rhythm: Normal rate and regular rhythm.  ?   Pulses: Normal pulses.  ?   Heart sounds: Normal heart sounds.  ?Pulmonary:  ?   Effort: Pulmonary effort is normal.  ?   Breath sounds:

## 2021-08-30 ENCOUNTER — Encounter: Payer: Managed Care, Other (non HMO) | Admitting: Rehabilitation

## 2021-08-30 ENCOUNTER — Ambulatory Visit
Admission: RE | Admit: 2021-08-30 | Discharge: 2021-08-30 | Disposition: A | Discharge status: Home / Self Care | Payer: Managed Care, Other (non HMO) | Source: Ambulatory Visit | Attending: Radiation Oncology | Admitting: Radiation Oncology

## 2021-08-30 ENCOUNTER — Other Ambulatory Visit: Payer: Self-pay

## 2021-08-30 DIAGNOSIS — Z5112 Encounter for antineoplastic immunotherapy: Secondary | ICD-10-CM | POA: Diagnosis not present

## 2021-08-30 LAB — URINE CULTURE: Culture: NO GROWTH

## 2021-08-30 LAB — RAD ONC ARIA SESSION SUMMARY
Course Elapsed Days: 14
Plan Fractions Treated to Date: 11
Plan Prescribed Dose Per Fraction: 2.67 Gy
Plan Total Fractions Prescribed: 15
Plan Total Prescribed Dose: 40.05 Gy
Reference Point Dosage Given to Date: 29.37 Gy
Reference Point Session Dosage Given: 2.67 Gy
Session Number: 11

## 2021-08-31 ENCOUNTER — Telehealth: Payer: Self-pay | Admitting: *Deleted

## 2021-08-31 ENCOUNTER — Other Ambulatory Visit: Payer: Self-pay

## 2021-08-31 ENCOUNTER — Encounter: Payer: Self-pay | Admitting: Hematology and Oncology

## 2021-08-31 ENCOUNTER — Ambulatory Visit
Admission: RE | Admit: 2021-08-31 | Discharge: 2021-08-31 | Disposition: A | Discharge status: Home / Self Care | Payer: Managed Care, Other (non HMO) | Source: Ambulatory Visit | Attending: Radiation Oncology | Admitting: Radiation Oncology

## 2021-08-31 DIAGNOSIS — Z5112 Encounter for antineoplastic immunotherapy: Secondary | ICD-10-CM | POA: Diagnosis not present

## 2021-08-31 LAB — RAD ONC ARIA SESSION SUMMARY
Course Elapsed Days: 15
Plan Fractions Treated to Date: 12
Plan Prescribed Dose Per Fraction: 2.67 Gy
Plan Total Fractions Prescribed: 15
Plan Total Prescribed Dose: 40.05 Gy
Reference Point Dosage Given to Date: 32.04 Gy
Reference Point Session Dosage Given: 2.67 Gy
Session Number: 12

## 2021-08-31 NOTE — Telephone Encounter (Signed)
Faxed referral and supporting docs to Dr. Claudia Desanctis w/[facility] requesting appt as soon as possible for patient. ?Fax confirmation received ?Contacted patient with this information - gave her phone number for [facility]. She will call them if she has not been contacted by first of next week. ?

## 2021-08-31 NOTE — Assessment & Plan Note (Signed)
03/14/2021:Palpable right breast mass diagnostic mammogram: 1.6 cm mass in the right breast at 9 o'clock. Biopsy: Grade 2-3 invasive ductal carcinoma, Her2+, Copy #5.85, ratio 3.16 ER+(60%)/PR+(0%), Ki-67 80%.? ?04/20/21: Rt Lumpectomy: Grade 3 IDC 0/5 Ln neg, Margins Neg,?Her2+, Copy #5.85, ratio 3.16 ER+(60%)/PR+(0%), Ki-67 80%. ?? ?Treatment Plan: ?1.?adjuvant chemotherapy with Taxol Herceptin followed by Herceptin maintenance ?2.??Adjuvant radiation therapy ?3.??Adjuvant antiestrogen therapy ?--------------------------------------------------------------------------------------------------------------------------------------------------- ?Current treatment: Herceptin/adjuvant radiation ?Echocardiogram 07/10/2021: EF 60 to 65% ?Labs reviewed ? ?Susan Reid is here for follow-up after experiencing bladder issues that began with surgery and chemotherapy.  We reviewed that we will start with urinalysis and urine culture however if those are inconclusive then we we will refer her to urology.  I discussed Dr. Claudia Desanctis at [facility] urology has a keen interest in these type of urological conditions in women who are undergoing breast cancer therapy. ? ?She will continue with her adjuvant treatment and her next appointment is on May 26 for labs, follow-up with Dr. Lindi Adie, her next trastuzumab. ? ?

## 2021-09-01 ENCOUNTER — Other Ambulatory Visit: Payer: Self-pay

## 2021-09-01 ENCOUNTER — Ambulatory Visit
Admission: RE | Admit: 2021-09-01 | Discharge: 2021-09-01 | Disposition: A | Discharge status: Home / Self Care | Payer: Managed Care, Other (non HMO) | Source: Ambulatory Visit | Attending: Radiation Oncology | Admitting: Radiation Oncology

## 2021-09-01 DIAGNOSIS — Z5112 Encounter for antineoplastic immunotherapy: Secondary | ICD-10-CM | POA: Diagnosis not present

## 2021-09-01 LAB — RAD ONC ARIA SESSION SUMMARY
Course Elapsed Days: 16
Plan Fractions Treated to Date: 13
Plan Prescribed Dose Per Fraction: 2.67 Gy
Plan Total Fractions Prescribed: 15
Plan Total Prescribed Dose: 40.05 Gy
Reference Point Dosage Given to Date: 34.71 Gy
Reference Point Session Dosage Given: 2.67 Gy
Session Number: 13

## 2021-09-04 ENCOUNTER — Ambulatory Visit
Admission: RE | Admit: 2021-09-04 | Discharge: 2021-09-04 | Disposition: A | Discharge status: Home / Self Care | Payer: Managed Care, Other (non HMO) | Source: Ambulatory Visit | Attending: Radiation Oncology | Admitting: Radiation Oncology

## 2021-09-04 ENCOUNTER — Other Ambulatory Visit: Payer: Self-pay

## 2021-09-04 DIAGNOSIS — Z5112 Encounter for antineoplastic immunotherapy: Secondary | ICD-10-CM | POA: Diagnosis not present

## 2021-09-04 LAB — RAD ONC ARIA SESSION SUMMARY
Course Elapsed Days: 19
Plan Fractions Treated to Date: 14
Plan Prescribed Dose Per Fraction: 2.67 Gy
Plan Total Fractions Prescribed: 15
Plan Total Prescribed Dose: 40.05 Gy
Reference Point Dosage Given to Date: 37.38 Gy
Reference Point Session Dosage Given: 2.67 Gy
Session Number: 14

## 2021-09-05 ENCOUNTER — Other Ambulatory Visit: Payer: Self-pay

## 2021-09-05 ENCOUNTER — Ambulatory Visit
Admission: RE | Admit: 2021-09-05 | Discharge: 2021-09-05 | Disposition: A | Discharge status: Home / Self Care | Payer: Managed Care, Other (non HMO) | Source: Ambulatory Visit | Attending: Radiation Oncology | Admitting: Radiation Oncology

## 2021-09-05 DIAGNOSIS — Z5112 Encounter for antineoplastic immunotherapy: Secondary | ICD-10-CM | POA: Diagnosis not present

## 2021-09-05 LAB — RAD ONC ARIA SESSION SUMMARY
Course Elapsed Days: 20
Plan Fractions Treated to Date: 15
Plan Prescribed Dose Per Fraction: 2.67 Gy
Plan Total Fractions Prescribed: 15
Plan Total Prescribed Dose: 40.05 Gy
Reference Point Dosage Given to Date: 40.05 Gy
Reference Point Session Dosage Given: 2.67 Gy
Session Number: 15

## 2021-09-06 ENCOUNTER — Ambulatory Visit
Admission: RE | Admit: 2021-09-06 | Discharge: 2021-09-06 | Disposition: A | Discharge status: Home / Self Care | Payer: Managed Care, Other (non HMO) | Source: Ambulatory Visit | Attending: Radiation Oncology | Admitting: Radiation Oncology

## 2021-09-06 ENCOUNTER — Ambulatory Visit: Payer: Managed Care, Other (non HMO)

## 2021-09-06 ENCOUNTER — Other Ambulatory Visit: Payer: Self-pay

## 2021-09-06 DIAGNOSIS — R293 Abnormal posture: Secondary | ICD-10-CM

## 2021-09-06 DIAGNOSIS — Z483 Aftercare following surgery for neoplasm: Secondary | ICD-10-CM

## 2021-09-06 DIAGNOSIS — R600 Localized edema: Secondary | ICD-10-CM

## 2021-09-06 DIAGNOSIS — Z170 Estrogen receptor positive status [ER+]: Secondary | ICD-10-CM

## 2021-09-06 DIAGNOSIS — Z5112 Encounter for antineoplastic immunotherapy: Secondary | ICD-10-CM | POA: Diagnosis not present

## 2021-09-06 DIAGNOSIS — M79601 Pain in right arm: Secondary | ICD-10-CM

## 2021-09-06 LAB — RAD ONC ARIA SESSION SUMMARY
Course Elapsed Days: 21
Plan Fractions Treated to Date: 1
Plan Prescribed Dose Per Fraction: 2 Gy
Plan Total Fractions Prescribed: 6
Plan Total Prescribed Dose: 12 Gy
Reference Point Dosage Given to Date: 42.05 Gy
Reference Point Session Dosage Given: 2 Gy
Session Number: 16

## 2021-09-06 NOTE — Therapy (Signed)
[place] ?[facility] [place] ?[place], [place], [address] ?Phone: [phone]   Fax:  [phone] ? ?Physical Therapy Treatment ? ?Patient Details  ?Name: Susan Reid ?MRN: [id number] ?Date of Birth: Sep 14, 1965 ?Referring Provider (PT): Dr. Stark Klein ? ? ?Encounter Date: 09/06/2021 ? ? PT End of Session - 09/06/21 1616   ? ? Visit Number 17   ? Number of Visits 21   ? Date for PT Re-Evaluation 10/04/21   ? PT Start Time 1407   ? PT Stop Time 1500   ? PT Time Calculation (min) 53 min   ? Activity Tolerance Patient tolerated treatment well   ? Behavior During Therapy [facility] for tasks assessed/performed   ? ?  ?  ? ?  ? ? ?Past Medical History:  ?Diagnosis Date  ? Allergy   ? Family history of adverse reaction to anesthesia   ? Mother got severe N/V and pneumonia following anesthesia  ? Osteopenia   ? ? ?Past Surgical History:  ?Procedure Laterality Date  ? AXILLARY SENTINEL NODE BIOPSY Right 04/20/2021  ? Procedure: RIGHT AXILLARY SENTINEL NODE BIOPSY;  Surgeon: Stark Klein, MD;  Location: [place];  Service: General;  Laterality: Right;  ? BREAST BIOPSY Right 02/25/2020  ? [place]  ? BREAST BIOPSY Right 03/14/2021  ? BREAST LUMPECTOMY WITH SENTINEL LYMPH NODE BIOPSY Right 04/20/2021  ? Procedure: RIGHT BREAST LUMPECTOMY;  Surgeon: Stark Klein, MD;  Location: [place];  Service: General;  Laterality: Right;  ? PORTACATH PLACEMENT Left 04/20/2021  ? Procedure: INSERTION PORT-A-CATH;  Surgeon: Stark Klein, MD;  Location: [place];  Service: General;  Laterality: Left;  ? WISDOM TOOTH EXTRACTION  04/24/1987  ? ? ?There were no vitals filed for this visit. ? ? Subjective Assessment - 09/06/21 1408   ? ? Subjective Started the Boost treatment today. Doing well with fatigue. I can't feel any cording. I have had class 1 Sleeve and glove on but took off  for radiation  ? Patient is accompained by: Family member   ? 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 She had a right lumpectomy and senitnel node biopsy (5 negative nodes) on 04/20/2021. It is ER positive, PR negative, and HER2 positive with a Ki67 of 80%.   ? ?  ?  ? ?  ? ? ? ? ? ? ? ? LYMPHEDEMA/ONCOLOGY QUESTIONNAIRE - 09/06/21 0001   ? ?  ? Right Upper Extremity Lymphedema  ? 10 cm Proximal to Olecranon Process 25.3 cm   ? Olecranon Process 22.7 cm   ? 10 cm Proximal to Ulnar Styloid Process 18.9 cm   ? Just Proximal to Ulnar Styloid Process 14.1 cm   ? Across Hand at [employer] 19 cm   ? At [facility] of 2nd Digit 6.1 cm   ? ?  ?  ? ?  ? ? ? ? ? ? ? ? ? ? ? ? ? [facility] Adult PT Treatment/Exercise - 09/06/21 0001   ? ?  ? Manual Therapy  ? Manual therapy comments Pts arm remeasured   ? Myofascial Release to right axilla/forearm to check for cording  ? Manual Lymphatic Drainage (MLD) Therapist performed all steps. short neck, 5 breaths bilateral axillary LN's and right inguinal LN's,Anterior interaxillary pathway, right axillo-inguinal pathway, Right upper arm lateral, medial to lateral and lateral arm again retracing pathways, then to forearm both sides and retracing pathways, then  hand retracing pathways and ending with LN's   ? Passive ROM In Supine to Rt shoulder into flexion, abduction, ER   ? ?  ?  ? ?  ? ? ? ? ? ? ? ? ? ? ? ? ? ? ? PT Long Term Goals - 07/26/21 1715   ? ?  ? PT LONG TERM GOAL #1  ? Title Patient will demonstrate she has regained full shoulder ROM and function post operatively compared to baselines.   ? Period Weeks   ? Status Achieved   ? Target Date 05/17/21   ?  ? PT LONG TERM GOAL #2  ? Title Patient will report >/= 50% less c/o tightness at end ROM right shoulder.   ? Time 4   ? Period Weeks   ? Status Achieved   ? Target Date 06/01/21   ?  ? PT LONG TERM GOAL #3  ? Title Patient will demonstrate >/= 50% reduction in visible cording (can compare photo under media tab)   ? Time 4   ? Period Weeks   ? Status Achieved   ? Target Date  06/01/21   ?  ? PT LONG TERM GOAL #4  ? Title Pt will be ind with final HEP for continued stretching   ? Period Weeks   ? Status On-going   ? Target Date 08/23/21   ?  ? PT LONG TERM GOAL #5  ? Title Pt will be independent with MLD to the right UE   ? Time 4   ? Period Weeks   ? Status New   ? Target Date 08/23/21   ?  ? Additional Long Term Goals  ? Additional Long Term Goals Yes   ?  ? PT LONG TERM GOAL #6  ? Title Pt will be independent in compression bandaging to reduce right hand and UE edema   ? Time 4   ? Status Achieved  ? Target Date 05/017/23   ?  ? PT LONG TERM GOAL #7  ? Title Pt will have decreased swelling at dorusmof hand on the right by 50%   ? Time 4   ? Period Weeks   ? Status Achieved   ? Target Date 08/23/21   ? ?LTG #8 ?Pt will be fit for class 2 compression sleeve and glove and will be independent in its use ?Time:4 weeks ?STATUS:New ?Target date 10/04/2021 ?  ?          ? ? ? ? ? ? ? Plan - 09/06/21 1620   ? ? Clinical Impression Statement Pt was remeasured with index finger and hand slightly swollen. Pt came from radiation and did not have sleeve on, Measured pt and had pt try on Medi Harmony sleeve for length. She fit into a Size 1 sleeve.  Will have [facility] order Medi Harmony sleeve and glove 30-40 instead of the [facility] which did not have a 30-40 glove. Continued MLD and a short period of MFR to cording.  Pt will not require alot of visits. She is independent with MLD and compression bandaging. Awaiting new sleeve and glove. She has been wearing mostly the class 1 sleeve and glove and wrapping at night.   ? Stability/Clinical Decision Making Stable/Uncomplicated   ? Rehab Potential Excellent   ? PT Frequency 1x / week   ? PT Duration 4 weeks   ? PT Treatment/Interventions ADLs/Self Care Home Management;Therapeutic exercise;Patient/family education;Manual techniques;Manual lymph drainage;Passive range of motion;Compression bandaging   ? PT  Next Visit Plan schedule SOZO out 1 month vs 3 months?  pt ready for DC with receipt of well fitting sleeve/glove and knowing that the new sleeve is working.   ? PT Home Exercise Plan Post op HEP and neural stretching, supine scap series, MLD right UE, bandaging right UE   ? Consulted and Agree with Plan of Care Patient   ? ?  ?  ? ?  ? ? ?Patient will benefit from skilled therapeutic intervention in order to improve the following deficits and impairments:  Postural dysfunction, Decreased range of motion, Decreased knowledge of precautions, Impaired UE functional use, Pain, Increased fascial restricitons ? ?Visit Diagnosis: ?Aftercare following surgery for neoplasm ? ?Pain in right arm ? ?Abnormal posture ? ?Malignant neoplasm of upper-outer quadrant of right breast in female, estrogen receptor positive ([place]) ? ?Localized edema ? ? ? ? ?Problem List ?Patient Active Problem List  ? Diagnosis Date Noted  ? Port-A-Cath in place 05/18/2021  ? Genetic testing 04/03/2021  ? Family history of breast cancer 03/23/2021  ? Malignant neoplasm of upper-outer quadrant of right breast in female, estrogen receptor positive ([place]) 03/20/2021  ? Osteoporosis 12/10/2018  ? ? ?Claris Pong, PT ?09/06/2021, 4:37 PM ? ?[place] [facility] ?[facility] [place] ?[place], [place], [address] ?Phone: [phone]   Fax:  [phone] ? ?Name: VLADA URIOSTEGUI ?MRN: [id number] ?Date of Birth: 1965/06/27 ? ? ? ?

## 2021-09-07 ENCOUNTER — Ambulatory Visit
Admission: RE | Admit: 2021-09-07 | Discharge: 2021-09-07 | Disposition: A | Discharge status: Home / Self Care | Payer: Managed Care, Other (non HMO) | Source: Ambulatory Visit | Attending: Radiation Oncology | Admitting: Radiation Oncology

## 2021-09-07 ENCOUNTER — Other Ambulatory Visit: Payer: Self-pay

## 2021-09-07 DIAGNOSIS — Z5112 Encounter for antineoplastic immunotherapy: Secondary | ICD-10-CM | POA: Diagnosis not present

## 2021-09-07 LAB — RAD ONC ARIA SESSION SUMMARY
Course Elapsed Days: 22
Plan Fractions Treated to Date: 2
Plan Prescribed Dose Per Fraction: 2 Gy
Plan Total Fractions Prescribed: 6
Plan Total Prescribed Dose: 12 Gy
Reference Point Dosage Given to Date: 44.05 Gy
Reference Point Session Dosage Given: 2 Gy
Session Number: 17

## 2021-09-08 ENCOUNTER — Other Ambulatory Visit: Payer: Self-pay

## 2021-09-08 ENCOUNTER — Ambulatory Visit
Admission: RE | Admit: 2021-09-08 | Discharge: 2021-09-08 | Disposition: A | Discharge status: Home / Self Care | Payer: Managed Care, Other (non HMO) | Source: Ambulatory Visit | Attending: Radiation Oncology | Admitting: Radiation Oncology

## 2021-09-08 DIAGNOSIS — Z5112 Encounter for antineoplastic immunotherapy: Secondary | ICD-10-CM | POA: Diagnosis not present

## 2021-09-08 LAB — RAD ONC ARIA SESSION SUMMARY
Course Elapsed Days: 23
Plan Fractions Treated to Date: 3
Plan Prescribed Dose Per Fraction: 2 Gy
Plan Total Fractions Prescribed: 6
Plan Total Prescribed Dose: 12 Gy
Reference Point Dosage Given to Date: 46.05 Gy
Reference Point Session Dosage Given: 2 Gy
Session Number: 18

## 2021-09-08 NOTE — Progress Notes (Signed)
Patient Care Team: Lesleigh Noe, MD as PCP - General (Family Medicine) Druscilla Brownie, MD as Consulting Physician (Dermatology) Rockwell Germany, RN as Oncology Nurse Navigator Tressie Guzzo, Paulette Blanch, RN as Oncology Nurse Navigator Stark Klein, MD as Consulting Physician (General Surgery) [name], MD as Consulting Physician (Hematology and Oncology) Gery Pray, MD as Consulting Physician (Radiation Oncology)  DIAGNOSIS:  Encounter Diagnosis  Name Primary?   Malignant neoplasm of upper-outer quadrant of right breast in female, estrogen receptor positive ([place]) Yes    SUMMARY OF ONCOLOGIC HISTORY: Oncology History  Malignant neoplasm of upper-outer quadrant of right breast in female, estrogen receptor positive ([place])  03/14/2021 Initial Diagnosis   Palpable right breast mass diagnostic mammogram: 1.6 cm mass in the right breast at 9 o'clock. 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    03/22/2021 Cancer Staging   Staging form: Breast, AJCC 8th Edition - Clinical stage from 03/22/2021: Stage IA (cT1b, cN0, cM0, G3, ER+, PR-, HER2+) - Signed by [name], MD on 03/22/2021 Stage prefix: Initial diagnosis Histologic grading system: 3 grade system     Genetic Testing   [facility] CustomNext Panel was Negative. Report date is 04/03/2021.  The CustomNext-Cancer+RNAinsight panel offered by [facility] includes sequencing and rearrangement analysis for the following 47 genes:  APC, ATM, AXIN2, BARD1, BMPR1A, BRCA1, BRCA2, BRIP1, CDH1, CDK4, CDKN2A, CHEK2, CTNNA1, DICER1, EPCAM, GREM1, HOXB13, KIT, MEN1, MLH1, MSH2, MSH3, MSH6, MUTYH, NBN, NF1, NTHL1, PALB2, PDGFRA, PMS2, POLD1, POLE, PTEN, RAD50, RAD51C, RAD51D, SDHA, SDHB, SDHC, SDHD, SMAD4, SMARCA4, STK11, TP53, TSC1, TSC2, and VHL.  RNA data is routinely analyzed for use in variant interpretation for all genes.   04/20/2021 Surgery   Rt Lumpectomy: Grade 3 IDC 1.9  cm 0/5 Ln neg, Margins Neg, Her2+, Copy #5.85, ratio 3.16 ER+(60%)/PR+(0%), Ki-67 80%   04/20/2021 Cancer Staging   Staging form: Breast, AJCC 8th Edition - Pathologic stage from 04/20/2021: Stage IA (pT1c, pN0, cM0, G3, ER+, PR-, HER2+) - Signed by [name], NP on 06/30/2021 Stage prefix: Initial diagnosis Histologic grading system: 3 grade system    05/18/2021 -  Chemotherapy   Patient is on Treatment Plan : BREAST Paclitaxel + Trastuzumab q7d / Trastuzumab q21d        CHIEF COMPLIANT:   Herceptin/adjuvant radiation  INTERVAL HISTORY: Susan Reid is a 56 y.o. with above-mentioned history of  breast cancer, currently on chemotherapy on Paclitaxel + Trastuzumab. She presents to the clinic today for follow-up and treatment. States that she is tolerating the herceptin. States that she urine frequently.  She thinks she may have a prolapse of the bladder and she is going to see urology for that.  She reports no major side effects from Herceptin other than mild dizziness couple of days after that.  She finished with radiation and is here to discuss antiestrogen therapy.   ALLERGIES:  is allergic to nitrates, organic.  MEDICATIONS:  Current Outpatient Medications  Medication Sig Dispense Refill   [START ON 10/05/2021] letrozole (FEMARA) 2.5 MG tablet Take 1 tablet (2.5 mg total) by mouth daily. 90 tablet 3   acetaminophen (TYLENOL) 325 MG tablet Take 1,000 mg by mouth every 6 (six) hours as needed.     Ascorbic Acid (VITAMIN C PO) Take 500 mg by mouth daily.     Calcium Citrate 1040 MG TABS Take 2,080 mg of elemental calcium by mouth daily.     cholecalciferol (VITAMIN D) 1000 UNITS tablet Take  1,000 Units by mouth daily.     fluticasone (FLONASE) 50 MCG/ACT nasal spray Place 1 spray into both nostrils daily as needed for allergies or rhinitis.     lidocaine-prilocaine (EMLA) cream Apply to affected area once 30 g 3   loratadine (CLARITIN) 10 MG tablet Take 10 mg by mouth  daily as needed for allergies.     LORazepam (ATIVAN) 0.5 MG tablet Take 1 tablet (0.5 mg total) by mouth at bedtime as needed (Nausea or vomiting). 30 tablet 0   Multiple Vitamin (MULTIVITAMIN) tablet Take 1 tablet by mouth daily.     No current facility-administered medications for this visit.    PHYSICAL EXAMINATION: ECOG PERFORMANCE STATUS: 1 - Symptomatic but completely ambulatory  Vitals:   09/15/21 0920  BP: 123/71  Pulse: 66  Resp: 18  Temp: 98.3 F (36.8 C)  SpO2: 100%   Filed Weights   09/15/21 0920  Weight: 125 lb 9.6 oz (57 kg)     LABORATORY DATA:  I have reviewed the data as listed    Latest Ref Rng & Units 08/29/2021   12:33 PM 08/04/2021    9:52 AM 07/28/2021   11:12 AM  CMP  Glucose 70 - 99 mg/dL 87   78   90    BUN 6 - 20 mg/dL 17   15   21     Creatinine 0.44 - 1.00 mg/dL 0.71   0.76   0.80    Sodium 135 - 145 mmol/L 140   140   140    Potassium 3.5 - 5.1 mmol/L 4.1   4.1   4.1    Chloride 98 - 111 mmol/L 105   105   107    CO2 22 - 32 mmol/L 25   31   28     Calcium 8.9 - 10.3 mg/dL 9.4   9.3   9.1    Total Protein 6.5 - 8.1 g/dL 7.1   6.5   6.4    Total Bilirubin 0.3 - 1.2 mg/dL 0.7   0.3   0.3    Alkaline Phos 38 - 126 U/L 78   73   78    AST 15 - 41 U/L 32   24   19    ALT 0 - 44 U/L 33   33   25      Lab Results  Component Value Date   WBC 3.2 (L) 09/15/2021   HGB 12.7 09/15/2021   HCT 38.9 09/15/2021   MCV 95.1 09/15/2021   PLT 246 09/15/2021   NEUTROABS 1.9 09/15/2021    ASSESSMENT & PLAN:  Malignant neoplasm of upper-outer quadrant of right breast in female, estrogen receptor positive ([place]) 03/14/2021:Palpable right breast mass diagnostic mammogram: 1.6 cm mass in the right breast at 9 o'clock. 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  04/20/21: Rt Lumpectomy: Grade 3 IDC 0/5 Ln neg, Margins Neg, Her2+, Copy #5.85, ratio 3.16 ER+(60%)/PR+(0%), Ki-67 80%.   Treatment  Plan: 1. adjuvant chemotherapy with Taxol Herceptin followed by Herceptin maintenance 2.  Adjuvant radiation therapy completed 09/13/2021 3.  Adjuvant antiestrogen therapy with letrozole to start 10/05/2021 Zometa infusion every 6 months x2 years --------------------------------------------------------------------------------------------------------------------------------------------------- Current treatment: Herceptin (until January 2024), letrozole to start 10/06/2018 Echocardiogram 07/10/2021: EF 60 to 65% Labs reviewed Toxicities: None  Bladder issues: Referred to urology, seeing Dr. Claudia Desanctis (feels that it is related to prolapse)  Since radiation is complete I recommended that she start  antiestrogen therapy with letrozole 2.5 mg daily x5 to 7 years Letrozole counseling: We discussed the risks and benefits of anti-estrogen therapy with aromatase inhibitors. These include but not limited to insomnia, hot flashes, mood changes, vaginal dryness, bone density loss, and weight gain. We strongly believe that the benefits far outweigh the risks. Patient understands these risks and consented to starting treatment. Planned treatment duration is 5-7 years.  Return to clinic every 3 weeks for Herceptin and every 6 weeks for follow-up with me. I would like to set her up to see Mendel Ryder in 6 weeks to do survivorship care plan along with her infusion appointment.  Osteoporosis: Patient has a T score of -2.7 in the back and [facility]  I recommended that she receive Zometa every 6 months for 2 years.  This is also for breast cancer prevention.  She will receive this with the next treatment.  She will start letrozole 10/05/2021.    Orders Placed This Encounter  Procedures   ECHOCARDIOGRAM COMPLETE    Standing Status:   Future    Standing Expiration Date:   09/16/2022    Order Specific Question:   Where should this test be performed    Answer:   [facility]    Order  Specific Question:   Perflutren DEFINITY (image enhancing agent) should be administered unless hypersensitivity or allergy exist    Answer:   Administer Perflutren    Order Specific Question:   Reason for exam-Echo    Answer:   Chemo  Z09   The patient has a good understanding of the overall plan. she agrees with it. she will call with any problems that may develop before the next visit here. Total time spent: 30 mins including face to face time and time spent for planning, charting and co-ordination of care   Harriette Ohara, MD 09/15/21    I Gardiner Coins am scribing for Dr. Lindi Adie  I have reviewed the above documentation for accuracy and completeness, and I agree with the above.

## 2021-09-11 ENCOUNTER — Other Ambulatory Visit: Payer: Self-pay

## 2021-09-11 ENCOUNTER — Ambulatory Visit
Admission: RE | Admit: 2021-09-11 | Discharge: 2021-09-11 | Disposition: A | Discharge status: Home / Self Care | Payer: Managed Care, Other (non HMO) | Source: Ambulatory Visit | Attending: Radiation Oncology | Admitting: Radiation Oncology

## 2021-09-11 DIAGNOSIS — Z5112 Encounter for antineoplastic immunotherapy: Secondary | ICD-10-CM | POA: Diagnosis not present

## 2021-09-11 LAB — RAD ONC ARIA SESSION SUMMARY
Course Elapsed Days: 26
Plan Fractions Treated to Date: 4
Plan Prescribed Dose Per Fraction: 2 Gy
Plan Total Fractions Prescribed: 6
Plan Total Prescribed Dose: 12 Gy
Reference Point Dosage Given to Date: 48.05 Gy
Reference Point Session Dosage Given: 2 Gy
Session Number: 19

## 2021-09-12 ENCOUNTER — Ambulatory Visit
Admission: RE | Admit: 2021-09-12 | Discharge: 2021-09-12 | Disposition: A | Discharge status: Home / Self Care | Payer: Managed Care, Other (non HMO) | Source: Ambulatory Visit | Attending: Radiation Oncology | Admitting: Radiation Oncology

## 2021-09-12 ENCOUNTER — Other Ambulatory Visit: Payer: Self-pay

## 2021-09-12 ENCOUNTER — Encounter: Payer: Self-pay | Admitting: *Deleted

## 2021-09-12 DIAGNOSIS — Z5112 Encounter for antineoplastic immunotherapy: Secondary | ICD-10-CM | POA: Diagnosis not present

## 2021-09-12 LAB — RAD ONC ARIA SESSION SUMMARY
Course Elapsed Days: 27
Plan Fractions Treated to Date: 5
Plan Prescribed Dose Per Fraction: 2 Gy
Plan Total Fractions Prescribed: 6
Plan Total Prescribed Dose: 12 Gy
Reference Point Dosage Given to Date: 50.05 Gy
Reference Point Session Dosage Given: 2 Gy
Session Number: 20

## 2021-09-13 ENCOUNTER — Ambulatory Visit: Payer: Managed Care, Other (non HMO)

## 2021-09-13 ENCOUNTER — Ambulatory Visit
Admission: RE | Admit: 2021-09-13 | Discharge: 2021-09-13 | Disposition: A | Discharge status: Home / Self Care | Payer: Managed Care, Other (non HMO) | Source: Ambulatory Visit | Attending: Radiation Oncology | Admitting: Radiation Oncology

## 2021-09-13 ENCOUNTER — Encounter: Payer: Self-pay | Admitting: Radiation Oncology

## 2021-09-13 ENCOUNTER — Other Ambulatory Visit: Payer: Self-pay

## 2021-09-13 DIAGNOSIS — Z5112 Encounter for antineoplastic immunotherapy: Secondary | ICD-10-CM | POA: Diagnosis not present

## 2021-09-13 LAB — RAD ONC ARIA SESSION SUMMARY
Course Elapsed Days: 28
Plan Fractions Treated to Date: 6
Plan Prescribed Dose Per Fraction: 2 Gy
Plan Total Fractions Prescribed: 6
Plan Total Prescribed Dose: 12 Gy
Reference Point Dosage Given to Date: 52.05 Gy
Reference Point Session Dosage Given: 2 Gy
Session Number: 21

## 2021-09-14 ENCOUNTER — Ambulatory Visit: Payer: Managed Care, Other (non HMO) | Admitting: Rehabilitation

## 2021-09-14 ENCOUNTER — Ambulatory Visit: Payer: Managed Care, Other (non HMO)

## 2021-09-14 ENCOUNTER — Encounter: Payer: Self-pay | Admitting: Rehabilitation

## 2021-09-14 DIAGNOSIS — Z483 Aftercare following surgery for neoplasm: Secondary | ICD-10-CM

## 2021-09-14 DIAGNOSIS — M79601 Pain in right arm: Secondary | ICD-10-CM

## 2021-09-14 DIAGNOSIS — R293 Abnormal posture: Secondary | ICD-10-CM

## 2021-09-14 DIAGNOSIS — C50411 Malignant neoplasm of upper-outer quadrant of right female breast: Secondary | ICD-10-CM

## 2021-09-14 DIAGNOSIS — R600 Localized edema: Secondary | ICD-10-CM

## 2021-09-14 NOTE — Therapy (Signed)
[facility] [place], [place], [address] Phone: [phone]   Fax:  [phone]  Physical Therapy Treatment  Patient Details  Name: Susan Reid MRN: [id number] Date of Birth: 10-11-1965 Referring Provider (PT): Dr. Stark Klein   Encounter Date: 09/14/2021   PT End of Session - 09/14/21 1100     Visit Number 18    Number of Visits 21    Date for PT Re-Evaluation 10/04/21    PT Start Time 1102    PT Stop Time 5809    PT Time Calculation (min) 53 min    Activity Tolerance Patient tolerated treatment well    Behavior During Therapy [facility] for tasks assessed/performed             Past Medical History:  Diagnosis Date   Allergy    Family history of adverse reaction to anesthesia    Mother got severe N/V and pneumonia following anesthesia   Osteopenia     Past Surgical History:  Procedure Laterality Date   AXILLARY SENTINEL NODE BIOPSY Right 04/20/2021   Procedure: RIGHT AXILLARY SENTINEL NODE BIOPSY;  Surgeon: Stark Klein, MD;  Location: [place];  Service: General;  Laterality: Right;   BREAST BIOPSY Right 02/25/2020   [place]   BREAST BIOPSY Right 03/14/2021   BREAST LUMPECTOMY WITH SENTINEL LYMPH NODE BIOPSY Right 04/20/2021   Procedure: RIGHT BREAST LUMPECTOMY;  Surgeon: Stark Klein, MD;  Location: [place];  Service: General;  Laterality: Right;   PORTACATH PLACEMENT Left 04/20/2021   Procedure: INSERTION PORT-A-CATH;  Surgeon: Stark Klein, MD;  Location: [place];  Service: General;  Laterality: Left;   WISDOM TOOTH EXTRACTION  04/24/1987    There were no vitals filed for this visit.   Subjective Assessment - 09/14/21 1158     Subjective No new garments yet    Pertinent History Patient was diagnosed on 03/07/2021 with 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 She had a right lumpectomy and senitnel node biopsy (5 negative nodes) on 04/20/2021. It is ER positive, PR negative, and  HER2 positive with a Ki67 of 80%.    Currently in Pain? No/denies                               [facility] Adult PT Treatment/Exercise - 09/14/21 0001       Manual Therapy   Manual Lymphatic Drainage (MLD) Therapist performed all steps. short neck, 5 breaths bilateral axillary LN's and right inguinal LN's,Anterior interaxillary pathway, right axillo-inguinal pathway, Right upper arm lateral, medial to lateral and lateral arm again retracing pathways, then to forearm both sides and retracing pathways, then hand retracing pathways and ending with LN's    Passive ROM In Supine to Rt shoulder into flexion, abduction, ER                          PT Long Term Goals - 09/06/21 1632       PT LONG TERM GOAL #1   Title Patient will demonstrate she has regained full shoulder ROM and function post operatively compared to baselines.    Time 8    Period Weeks    Status Achieved    Target Date 05/17/21      PT LONG TERM GOAL #2   Title Patient will report >/= 50% less c/o tightness at end ROM right shoulder.  Time 4    Period Weeks    Status Achieved    Target Date 06/01/21      PT LONG TERM GOAL #3   Title Patient will demonstrate >/= 50% reduction in visible cording (can compare photo under media tab)    Time 4    Period Weeks    Status Achieved    Target Date 06/01/21      PT LONG TERM GOAL #4   Title Pt will be ind with final HEP for continued stretching    Time 3    Period Weeks    Status Achieved    Target Date 09/06/21      PT LONG TERM GOAL #5   Title Pt will be independent with MLD to the right UE    Time 4    Period Weeks    Status On-going    Target Date 10/04/21      Additional Long Term Goals   Additional Long Term Goals Yes      PT LONG TERM GOAL #6   Title Pt will be independent in compression bandaging to reduce right hand and UE edema    Time 4    Period Weeks    Status Achieved    Target Date 09/06/21      PT LONG TERM  GOAL #7   Title Pt will have decreased swelling at dorsum of hand on the right by 50%    Time 4    Period Weeks    Status Achieved    Target Date 08/23/21      PT LONG TERM GOAL #8   Title Pt will be fit for Class 2 compression sleeve and glove and be independent in there use    Time 4    Period Weeks    Target Date 10/04/21                   Plan - 09/14/21 1158     Clinical Impression Statement Thumb and back of had slightly swollen today. pt notes from the boost.  Continued MLD as pt awaits for compression sleeve which she will hopefully have back on next visit as she has a week off next week for vacation.    PT Frequency 1x / week    PT Duration 4 weeks    PT Treatment/Interventions ADLs/Self Care Home Management;Therapeutic exercise;Patient/family education;Manual techniques;Manual lymph drainage;Passive range of motion;Compression bandaging    PT Next Visit Plan schedule SOZO out 1 month vs 3 months? pt ready for DC with receipt of well fitting sleeve/glove and knowing that the new sleeve is working.             Patient will benefit from skilled therapeutic intervention in order to improve the following deficits and impairments:     Visit Diagnosis: Aftercare following surgery for neoplasm  Pain in right arm  Abnormal posture  Malignant neoplasm of upper-outer quadrant of right breast in female, estrogen receptor positive ([place])  Localized edema     Problem List Patient Active Problem List   Diagnosis Date Noted   Port-A-Cath in place 05/18/2021   Genetic testing 04/03/2021   Family history of breast cancer 03/23/2021   Malignant neoplasm of upper-outer quadrant of right breast in female, estrogen receptor positive ([place]) 03/20/2021   Osteoporosis 12/10/2018    Stark Bray, PT 09/14/2021, 11:59 AM  [facility] [address] [place], [place], [address] Phone: [phone]  Fax:   [phone]  Name: Susan Reid MRN: [id number] Date of Birth: [birth year]

## 2021-09-15 ENCOUNTER — Ambulatory Visit: Payer: Managed Care, Other (non HMO)

## 2021-09-15 ENCOUNTER — Other Ambulatory Visit: Payer: Self-pay

## 2021-09-15 ENCOUNTER — Inpatient Hospital Stay: Payer: Managed Care, Other (non HMO)

## 2021-09-15 ENCOUNTER — Inpatient Hospital Stay (HOSPITAL_BASED_OUTPATIENT_CLINIC_OR_DEPARTMENT_OTHER): Payer: Managed Care, Other (non HMO) | Admitting: Hematology and Oncology

## 2021-09-15 VITALS — BP 123/71 | HR 66 | Temp 98.3°F | Resp 18 | Ht 65.0 in | Wt 125.6 lb

## 2021-09-15 DIAGNOSIS — Z170 Estrogen receptor positive status [ER+]: Secondary | ICD-10-CM | POA: Diagnosis not present

## 2021-09-15 DIAGNOSIS — C50411 Malignant neoplasm of upper-outer quadrant of right female breast: Secondary | ICD-10-CM | POA: Diagnosis not present

## 2021-09-15 DIAGNOSIS — Z5112 Encounter for antineoplastic immunotherapy: Secondary | ICD-10-CM | POA: Diagnosis not present

## 2021-09-15 DIAGNOSIS — Z95828 Presence of other vascular implants and grafts: Secondary | ICD-10-CM

## 2021-09-15 LAB — CBC WITH DIFFERENTIAL (CANCER CENTER ONLY)
Abs Immature Granulocytes: 0 10*3/uL (ref 0.00–0.07)
Basophils Absolute: 0 10*3/uL (ref 0.0–0.1)
Basophils Relative: 1 %
Eosinophils Absolute: 0.2 10*3/uL (ref 0.0–0.5)
Eosinophils Relative: 8 %
HCT: 38.9 % (ref 36.0–46.0)
Hemoglobin: 12.7 g/dL (ref 12.0–15.0)
Immature Granulocytes: 0 %
Lymphocytes Relative: 22 %
Lymphs Abs: 0.7 10*3/uL (ref 0.7–4.0)
MCH: 31.1 pg (ref 26.0–34.0)
MCHC: 32.6 g/dL (ref 30.0–36.0)
MCV: 95.1 fL (ref 80.0–100.0)
Monocytes Absolute: 0.3 10*3/uL (ref 0.1–1.0)
Monocytes Relative: 9 %
Neutro Abs: 1.9 10*3/uL (ref 1.7–7.7)
Neutrophils Relative %: 60 %
Platelet Count: 246 10*3/uL (ref 150–400)
RBC: 4.09 MIL/uL (ref 3.87–5.11)
RDW: 12.6 % (ref 11.5–15.5)
WBC Count: 3.2 10*3/uL — ABNORMAL LOW (ref 4.0–10.5)
nRBC: 0 % (ref 0.0–0.2)

## 2021-09-15 LAB — CMP (CANCER CENTER ONLY)
ALT: 19 U/L (ref 0–44)
AST: 22 U/L (ref 15–41)
Albumin: 4.5 g/dL (ref 3.5–5.0)
Alkaline Phosphatase: 77 U/L (ref 38–126)
Anion gap: 6 (ref 5–15)
BUN: 14 mg/dL (ref 6–20)
CO2: 30 mmol/L (ref 22–32)
Calcium: 9.8 mg/dL (ref 8.9–10.3)
Chloride: 107 mmol/L (ref 98–111)
Creatinine: 0.75 mg/dL (ref 0.44–1.00)
GFR, Estimated: >60 mL/min (ref >60)
Glucose, Bld: 90 mg/dL (ref 70–99)
Potassium: 4.1 mmol/L (ref 3.5–5.1)
Sodium: 143 mmol/L (ref 135–145)
Total Bilirubin: 0.6 mg/dL (ref 0.3–1.2)
Total Protein: 6.8 g/dL (ref 6.5–8.1)

## 2021-09-15 MED ORDER — SODIUM CHLORIDE 0.9% FLUSH
10.0000 mL | INTRAVENOUS | Status: DC | PRN
  Administered 2021-09-15: 10 mL

## 2021-09-15 MED ORDER — HEPARIN SOD (PORK) LOCK FLUSH 100 UNIT/ML IV SOLN
500.0000 [IU] | Freq: Once | INTRAVENOUS | Status: AC | PRN
  Administered 2021-09-15: 500 [IU]

## 2021-09-15 MED ORDER — SODIUM CHLORIDE 0.9 % IV SOLN: Freq: Once | INTRAVENOUS | Status: AC

## 2021-09-15 MED ORDER — LETROZOLE 2.5 MG PO TABS: 2.5000 mg | ORAL_TABLET | Freq: Every day | ORAL | 3 refills | Status: DC

## 2021-09-15 MED ORDER — DIPHENHYDRAMINE HCL 25 MG PO CAPS
50.0000 mg | ORAL_CAPSULE | Freq: Once | ORAL | Status: AC
  Administered 2021-09-15: 50 mg via ORAL
  Filled 2021-09-15: qty 2

## 2021-09-15 MED ORDER — SODIUM CHLORIDE 0.9% FLUSH
10.0000 mL | Freq: Once | INTRAVENOUS | Status: AC
  Administered 2021-09-15: 10 mL

## 2021-09-15 MED ORDER — TRASTUZUMAB-ANNS CHEMO 150 MG IV SOLR
6.0000 mg/kg | Freq: Once | INTRAVENOUS | Status: AC
  Administered 2021-09-15: 336 mg via INTRAVENOUS
  Filled 2021-09-15: qty 16

## 2021-09-15 MED ORDER — ACETAMINOPHEN 325 MG PO TABS
650.0000 mg | ORAL_TABLET | Freq: Once | ORAL | Status: AC
  Administered 2021-09-15: 650 mg via ORAL
  Filled 2021-09-15: qty 2

## 2021-09-15 NOTE — Patient Instructions (Addendum)
[facility]  Discharge Instructions: °Thank you for choosing [facility] to provide your oncology and hematology care.  ° °If you have a lab appointment with the Cancer Center, please go directly to the Cancer Center and check in at the registration area. °  °Wear comfortable clothing and clothing appropriate for easy access to any Portacath or PICC line.  ° °We strive to give you quality time with your provider. You may need to reschedule your appointment if you arrive late (15 or more minutes).  Arriving late affects you and other patients whose appointments are after yours.  Also, if you miss three or more appointments without notifying the office, you may be dismissed from the clinic at the provider’s discretion.    °  °For prescription refill requests, have your pharmacy contact our office and allow 72 hours for refills to be completed.   ° °Today you received the following chemotherapy and/or immunotherapy agent: Trastuzumab (Kanjinti). °  °To help prevent nausea and vomiting after your treatment, we encourage you to take your nausea medication as directed. ° °BELOW ARE SYMPTOMS THAT SHOULD BE REPORTED IMMEDIATELY: °*FEVER GREATER THAN 100.4 F (38 °C) OR HIGHER °*CHILLS OR SWEATING °*NAUSEA AND VOMITING THAT IS NOT CONTROLLED WITH YOUR NAUSEA MEDICATION °*UNUSUAL SHORTNESS OF BREATH °*UNUSUAL BRUISING OR BLEEDING °*URINARY PROBLEMS (pain or burning when urinating, or frequent urination) °*BOWEL PROBLEMS (unusual diarrhea, constipation, pain near the anus) °TENDERNESS IN MOUTH AND THROAT WITH OR WITHOUT PRESENCE OF ULCERS (sore throat, sores in mouth, or a toothache) °UNUSUAL RASH, SWELLING OR PAIN  °UNUSUAL VAGINAL DISCHARGE OR ITCHING  ° °Items with * indicate a potential emergency and should be followed up as soon as possible or go to the Emergency Department if any problems should occur. ° °Please show the CHEMOTHERAPY ALERT CARD or IMMUNOTHERAPY ALERT CARD at  check-in to the Emergency Department and triage nurse. ° °Should you have questions after your visit or need to cancel or reschedule your appointment, please contact [facility]  Dept: [phone]  and follow the prompts.  Office hours are 8:00 a.m. to 4:30 p.m. Monday - Friday. Please note that voicemails left after 4:00 p.m. may not be returned until the following business day.  We are closed weekends and major holidays. You have access to a nurse at all times for urgent questions. Please call the main number to the clinic Dept: [phone] and follow the prompts. ° ° °For any non-urgent questions, you may also contact your provider using MyChart. We now offer e-Visits for anyone 18 and older to request care online for non-urgent symptoms. For details visit [email or web address]. °  °Also download the MyChart app! Go to the app store, search "MyChart", open the app, select [facility], and log in with your MyChart username and password. ° °Due to Covid, a mask is required upon entering the hospital/clinic. If you do not have a mask, one will be given to you upon arrival. For doctor visits, patients may have 1 support person aged 18 or older with them. For treatment visits, patients cannot have anyone with them due to current Covid guidelines and our immunocompromised population.  ° °

## 2021-09-15 NOTE — Assessment & Plan Note (Addendum)
03/14/2021:Palpable right breast mass diagnostic mammogram: 1.6 cm mass in the right breast at 9 o'clock. 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 04/20/21: Rt Lumpectomy: Grade 3 IDC 0/5 Ln neg, Margins Neg,Her2+, Copy #5.85, ratio 3.16 ER+(60%)/PR+(0%), Ki-67 80%.  Treatment Plan: 1.adjuvant chemotherapy with Taxol Herceptin followed by Herceptin maintenance 2.Adjuvant radiation therapy completed 09/13/2021 3.Adjuvant antiestrogen therapy with letrozole to start 10/05/2021 Zometa infusion every 6 months x2 years --------------------------------------------------------------------------------------------------------------------------------------------------- Current treatment: Herceptin (until January 2024), letrozole to start 10/06/2018 Echocardiogram 07/10/2021: EF 60 to 65% Labs reviewed Toxicities: None  Bladder issues: Referred to urology, seeing Dr. Claudia Desanctis (feels that it is related to prolapse)  Since radiation is complete I recommended that she start antiestrogen therapy with letrozole 2.5 mg daily x5 to 7 years Letrozole counseling: We discussed the risks and benefits of anti-estrogen therapy with aromatase inhibitors. These include but not limited to insomnia, hot flashes, mood changes, vaginal dryness, bone density loss, and weight gain. We strongly believe that the benefits far outweigh the risks. Patient understands these risks and consented to starting treatment. Planned treatment duration is 5-7 years.  Return to clinic every 3 weeks for Herceptin and every 6 weeks for follow-up with me. I would like to set her up to see Mendel Ryder in 6 weeks to do survivorship care plan along with her infusion appointment.  Osteoporosis: Patient has a T score of -2.7 in the back and [facility]  I recommended that she receive Zometa every 6 months for 2 years.  This is also for breast cancer  prevention.  She will receive this with the next treatment.  She will start letrozole 10/05/2021.

## 2021-09-19 ENCOUNTER — Telehealth: Payer: Self-pay | Admitting: Hematology and Oncology

## 2021-09-19 ENCOUNTER — Ambulatory Visit: Payer: Managed Care, Other (non HMO)

## 2021-09-19 NOTE — Telephone Encounter (Signed)
Scheduled appointment per 5/26 los. Left message.

## 2021-09-25 ENCOUNTER — Encounter: Payer: Self-pay | Admitting: Hematology and Oncology

## 2021-09-28 ENCOUNTER — Ambulatory Visit: Payer: Managed Care, Other (non HMO) | Attending: General Surgery

## 2021-09-28 DIAGNOSIS — Z483 Aftercare following surgery for neoplasm: Secondary | ICD-10-CM | POA: Insufficient documentation

## 2021-09-28 DIAGNOSIS — C50411 Malignant neoplasm of upper-outer quadrant of right female breast: Secondary | ICD-10-CM | POA: Insufficient documentation

## 2021-09-28 DIAGNOSIS — Z170 Estrogen receptor positive status [ER+]: Secondary | ICD-10-CM | POA: Insufficient documentation

## 2021-09-28 DIAGNOSIS — R600 Localized edema: Secondary | ICD-10-CM | POA: Insufficient documentation

## 2021-09-28 DIAGNOSIS — M79601 Pain in right arm: Secondary | ICD-10-CM | POA: Diagnosis present

## 2021-09-28 DIAGNOSIS — R293 Abnormal posture: Secondary | ICD-10-CM | POA: Insufficient documentation

## 2021-09-28 NOTE — Therapy (Signed)
[facility] @ [address] [place], [place], [address] Phone: [phone]   Fax:  [phone]  Physical Therapy Treatment  Patient Details  Name: Susan Reid MRN: [id number] Date of Birth: [birth year] Referring Provider (PT): Dr. Stark Klein   Encounter Date: 09/28/2021   PT End of Session - 09/28/21 1557     Visit Number 19    Number of Visits 21    Date for PT Re-Evaluation 11/09/21    PT Start Time 1559    PT Stop Time 9532    PT Time Calculation (min) 36 min    Activity Tolerance Patient tolerated treatment well    Behavior During Therapy [facility] for tasks assessed/performed             Past Medical History:  Diagnosis Date   Allergy    Family history of adverse reaction to anesthesia    Mother got severe N/V and pneumonia following anesthesia   Osteopenia     Past Surgical History:  Procedure Laterality Date   AXILLARY SENTINEL NODE BIOPSY Right 04/20/2021   Procedure: RIGHT AXILLARY SENTINEL NODE BIOPSY;  Surgeon: Stark Klein, MD;  Location: [facility];  Service: General;  Laterality: Right;   BREAST BIOPSY Right 02/25/2020   [place]   BREAST BIOPSY Right 03/14/2021   BREAST LUMPECTOMY WITH SENTINEL LYMPH NODE BIOPSY Right 04/20/2021   Procedure: RIGHT BREAST LUMPECTOMY;  Surgeon: Stark Klein, MD;  Location: [facility];  Service: General;  Laterality: Right;   PORTACATH PLACEMENT Left 04/20/2021   Procedure: INSERTION PORT-A-CATH;  Surgeon: Stark Klein, MD;  Location: [facility];  Service: General;  Laterality: Left;   WISDOM TOOTH EXTRACTION  04/24/1987    There were no vitals filed for this visit.   Subjective Assessment - 09/28/21 1556     Subjective I think my arm is doing well. I just took the garments off about 2:45. I think the swelling is staying down better. No problems with the cording. I am doing the MLD every night    Pertinent History Patient was diagnosed on 03/07/2021 with right grade II-III invasive  ductal carcinoma breast cancer. She had a right lumpectomy and senitnel node biopsy (5 negative nodes) on 04/20/2021. It is ER positive, PR negative, and HER2 positive with a Ki67 of 80%.    Patient Stated Goals See if my arm is doing ok    Currently in Pain? No/denies    Pain Score 0-No pain    Multiple Pain Sites No                   LYMPHEDEMA/ONCOLOGY QUESTIONNAIRE - 09/28/21 0001       Right Upper Extremity Lymphedema   10 cm Proximal to Olecranon Process 25.2 cm    Olecranon Process 22.3 cm    10 cm Proximal to Ulnar Styloid Process 19 cm    Just Proximal to Ulnar Styloid Process 14.1 cm    Across Hand at [employer] 18.7 cm    At [place] of 2nd Digit 6.1 cm      Left Upper Extremity Lymphedema   10 cm Proximal to Olecranon Process 24.3 cm    Olecranon Process 23.2 cm    10 cm Proximal to Ulnar Styloid Process 18.4 cm    Just Proximal to Ulnar Styloid Process 13.7 cm    Across Hand at [employer] 18.4 cm    At [place] of 2nd Digit 5.7 cm  L-DEX FLOWSHEETS - 09/28/21 1600       L-DEX LYMPHEDEMA SCREENING   Measurement Type Unilateral    L-DEX MEASUREMENT EXTREMITY Upper Extremity    POSITION  Standing    DOMINANT SIDE Right    At Risk Side Right    BASELINE SCORE (UNILATERAL) 2.8    L-DEX SCORE (UNILATERAL) 4.1    VALUE CHANGE (UNILAT) 1.3                       [facility] Adult PT Treatment/Exercise - 09/28/21 0001       Manual Therapy   Manual therapy comments Pts. bilateral UE's measured    Edema Management Visually checked pts arm; veins with excellent visibility on right, no bogginess or pitting noted. Measurements very close to prior to her developing swelling.Performed SOZO. score now only 1.3 above baseline. Pt with questions about wrapping and wondering when she can try going at night without the wrap. She will be out of town this weekend and not that active so will try going without bandaging at night to assess her arm,  but will wear her sleeve in the day time. Advised not to go too many nights in a row without bandaging. Discussed option of night garment, but she is doing so well with bandaging may not need.                         PT Long Term Goals - 09/28/21 1643       PT LONG TERM GOAL #1   Title Patient will demonstrate she has regained full shoulder ROM and function post operatively compared to baselines.    Time 8    Period Weeks    Status Achieved    Target Date 05/17/21      PT LONG TERM GOAL #2   Title Patient will report >/= 50% less c/o tightness at end ROM right shoulder.    Time 4    Period Weeks    Status Achieved    Target Date 06/01/21      PT LONG TERM GOAL #3   Title Patient will demonstrate >/= 50% reduction in visible cording (can compare photo under media tab)    Time 4    Period Weeks    Status Achieved    Target Date 06/01/21      PT LONG TERM GOAL #4   Title Pt will be ind with final HEP for continued stretching    Time 3    Period Weeks    Status Achieved    Target Date 09/06/21      PT LONG TERM GOAL #5   Title Pt will be independent with MLD to the right UE    Time 4    Period Weeks    Status Achieved    Target Date 09/28/21      Additional Long Term Goals   Additional Long Term Goals Yes      PT LONG TERM GOAL #6   Title Pt will be independent in compression bandaging to reduce right hand and UE edema    Time 4    Period Weeks    Status Achieved    Target Date 09/06/21      PT LONG TERM GOAL #7   Title Pt will have decreased swelling at dorsum of hand on the right by 50%    Time 4    Period Weeks    Status Achieved  PT LONG TERM GOAL #8   Title Pt will be fit for Class 2 compression sleeve and glove and be independent in there use    Time 4    Period Weeks    Status Achieved    Target Date 09/28/21      PT LONG TERM GOAL  #9   TITLE Pt will be independent with self management of Lymphedema    Time 6    Period Weeks     Status New    Target Date 11/09/21                   Plan - 09/28/21 1646     Clinical Impression Statement Pts hand and arm look excellent today. She has been very compliant with MLD and wrapping at night, and garment wear during the day.  Her measurements today were very similar to her measurements prior to getting UE swelling, and her SOZO screen was only 1.3 above her baseline. There is no pitting or bogginess and only a slight bit of swelling noted at the thumb.  We will reassess independent selt managment in about 4 weeks and if she is doing well will DC at that time. She may want to consider a night garment.    Stability/Clinical Decision Making Stable/Uncomplicated    Rehab Potential Excellent    PT Frequency 1x / week    PT Duration 6 weeks    PT Treatment/Interventions ADLs/Self Care Home Management;Therapeutic exercise;Patient/family education;Manual techniques;Manual lymph drainage;Passive range of motion;Compression bandaging    PT Next Visit Plan REmeasure, redo SOZO and schedule out 3 months,    PT Home Exercise Plan Post op HEP and neural stretching, supine scap series, MLD right UE, bandaging right UE    Consulted and Agree with Plan of Care Patient             Patient will benefit from skilled therapeutic intervention in order to improve the following deficits and impairments:  Postural dysfunction, Decreased range of motion, Decreased knowledge of precautions, Impaired UE functional use, Pain, Increased fascial restricitons  Visit Diagnosis: Aftercare following surgery for neoplasm  Pain in right arm  Abnormal posture  Malignant neoplasm of upper-outer quadrant of right breast in female, estrogen receptor positive ([place])  Localized edema     Problem List Patient Active Problem List   Diagnosis Date Noted   Port-A-Cath in place 05/18/2021   Genetic testing 04/03/2021   Family history of breast cancer 03/23/2021   Malignant neoplasm of  upper-outer quadrant of right breast in female, estrogen receptor positive ([place]) 03/20/2021   Osteoporosis 12/10/2018    Claris Pong, PT 09/28/2021, 4:52 PM  [facility] @ [address] [place], [place], [address] Phone: [phone]   Fax:  [phone]  Name: NEELA ZECCA MRN: [id number] Date of Birth: [birth year]

## 2021-10-05 ENCOUNTER — Encounter: Payer: Self-pay | Admitting: Physical Therapy

## 2021-10-05 ENCOUNTER — Ambulatory Visit: Payer: Managed Care, Other (non HMO) | Attending: Urology | Admitting: Physical Therapy

## 2021-10-05 DIAGNOSIS — M62838 Other muscle spasm: Secondary | ICD-10-CM | POA: Diagnosis present

## 2021-10-05 DIAGNOSIS — R293 Abnormal posture: Secondary | ICD-10-CM | POA: Insufficient documentation

## 2021-10-05 DIAGNOSIS — R279 Unspecified lack of coordination: Secondary | ICD-10-CM | POA: Insufficient documentation

## 2021-10-05 DIAGNOSIS — M6281 Muscle weakness (generalized): Secondary | ICD-10-CM | POA: Insufficient documentation

## 2021-10-05 NOTE — Therapy (Signed)
OUTPATIENT PHYSICAL THERAPY FEMALE PELVIC EVALUATION   Patient Name: CLARY BOULAIS MRN: [id number] DOB:03-23-1966, 56 y.o., female Today's Date: 10/05/2021    Past Medical History:  Diagnosis Date   Allergy    Family history of adverse reaction to anesthesia    Mother got severe N/V and pneumonia following anesthesia   Osteopenia    Past Surgical History:  Procedure Laterality Date   AXILLARY SENTINEL NODE BIOPSY Right 04/20/2021   Procedure: RIGHT AXILLARY SENTINEL NODE BIOPSY;  Surgeon: Stark Klein, MD;  Location: [facility];  Service: General;  Laterality: Right;   BREAST BIOPSY Right 02/25/2020   [place]   BREAST BIOPSY Right 03/14/2021   BREAST LUMPECTOMY WITH SENTINEL LYMPH NODE BIOPSY Right 04/20/2021   Procedure: RIGHT BREAST LUMPECTOMY;  Surgeon: Stark Klein, MD;  Location: [facility];  Service: General;  Laterality: Right;   PORTACATH PLACEMENT Left 04/20/2021   Procedure: INSERTION PORT-A-CATH;  Surgeon: Stark Klein, MD;  Location: [facility];  Service: General;  Laterality: Left;   [place] EXTRACTION  04/24/1987   Patient Active Problem List   Diagnosis Date Noted   Port-A-Cath in place 05/18/2021   Genetic testing 04/03/2021   Family history of breast cancer 03/23/2021   Malignant neoplasm of upper-outer quadrant of right breast in female, estrogen receptor positive ([place]) 03/20/2021   Osteoporosis 12/10/2018    PCP: Lesleigh Noe, MD  REFERRING PROVIDER: Robley Fries, MD  REFERRING DIAG: N81.11 (ICD-10-CM) - Cystocele, midline N95.2 (ICD-10-CM) - Postmenopausal atrophic vaginitis  THERAPY DIAG:  Muscle weakness (generalized)  Abnormal posture  Unspecified lack of coordination  Other muscle spasm  Rationale for Evaluation and Treatment Rehabilitation  ONSET DATE: 4 weeks ago  SUBJECTIVE:                                                                                                                                                                                            SUBJECTIVE STATEMENT: With drop in estrogen with CA treatments, noted vaginal dryness, and started feeling a bulge between legs. No longer  having Chemo or radiation.   Fluid intake: Yes: 60-70oz per day; coffee 2 cups in AM     PAIN:  Are you having pain? No  PRECAUTIONS: Other: CA patient  WEIGHT BEARING RESTRICTIONS No  FALLS:  Has patient fallen in last 6 months? No  LIVING ENVIRONMENT: Lives with: lives with their spouse Lives in: House/apartment   OCCUPATION: Surveyor, quantity, desk job  PLOF: Independent  PATIENT GOALS to have less of the prolapse feeling  PERTINENT HISTORY:  Patient was diagnosed on 03/07/2021 with right grade II-III invasive ductal carcinoma breast  cancer. She had a right lumpectomy and senitnel node biopsy (5 negative nodes) on 04/20/2021. It is ER positive, PR negative, and HER2 positive with a Ki67 of 80%. Pt reports she is now finished with chemo/radiation treatments but does continue immunosuppression treatments.  Sexual abuse: No  BOWEL MOVEMENT Pain with bowel movement: No Type of bowel movement:Type (Bristol Stool Scale) 4, Frequency daily, and Strain No Fully empty rectum: No Leakage: No Pads: No Fiber supplement: No  URINATION Pain with urination: No Fully empty bladder: Yes:   Stream: Strong Urgency: No Frequency: not sooner than every 2 hours, none at night Leakage:  double urinating rarely Pads: No  INTERCOURSE Pain with intercourse:  None, uses lubricant Ability to have vaginal penetration:  Yes:   Climax: not painful Marinoff Scale: 0/3  PREGNANCY Vaginal deliveries 2 Tearing Yes: with first C-section deliveries 0 Currently pregnant No  PROLAPSE Cystocele "slight" per pt - started with a sensation of "falling out" but then could feel it with hand internally.     OBJECTIVE:   DIAGNOSTIC FINDINGS:    COGNITION:  Overall cognitive status: Within functional limits for tasks  assessed     SENSATION:  Light touch: Appears intact  Proprioception: Appears intact  MUSCLE LENGTH: Bil hamstrings and adductors limited by 25%                POSTURE: rounded shoulders and posterior pelvic tilt   LUMBARAROM/PROM  A/PROM A/PROM  eval  Flexion   Extension   Right lateral flexion   Left lateral flexion   Right rotation   Left rotation    (Blank rows = not tested)  LOWER EXTREMITY ROM:  WFL  LOWER EXTREMITY MMT:  Rt hip abduction 3+/5, all other Rt 4/5; Lt hip grossly 4+/5; knees and ankles 5/5   PALPATION:   General  no TTP                External Perineal Exam no TTP, mild dryness noted at vulva                              Internal Pelvic Floor no TTP, mild dryness noted  Patient confirms identification and approves PT to assess internal pelvic floor and treatment Yes  PELVIC MMT:   MMT eval  Vaginal 0/5 initially with max cues and extra time and reps 2/5; 6s isometric; 4 reps  Internal Anal Sphincter   External Anal Sphincter   Puborectalis   Diastasis Recti   (Blank rows = not tested)        TONE: Decreased   PROLAPSE: Possible grade 1 anterior and posterior wall laxity in hooklying with strong cough, potentially grade two in standing due to gravity and pt reporting symptoms worse in standing  TODAY'S TREATMENT  10/05/2021 EVAL Examination completed, findings reviewed, pt educated on POC, HEP, and prolapse relief positions. Pt motivated to participate in PT and agreeable to attempt recommendations.     PATIENT EDUCATION:  Education details: [id number] Person educated: Patient Education method: Explanation, Demonstration, Tactile cues, Verbal cues, and Handouts Education comprehension: verbalized understanding and returned demonstration   HOME EXERCISE PROGRAM: [id number]  ASSESSMENT:  CLINICAL IMPRESSION: Patient is a 56 y.o. female  who was seen today for physical therapy evaluation and treatment for cystocele and   Postmenopausal atrophic vaginitis. Pt reports she noticed symptoms about one month ago and really correlated with decrease estrogen with CA treatments. Began to feel a  bulge between her legs and then mildly worse to be able to feel it with hand internally. Pt reports she is not lifting anything so wouldn't know if this makes it worse but can tell prolonged standing or a busier day on her feet makes it worse at the end of the day. Pt found to have decreased bil hip strength and flexibility and decreased core strength. Pt consented to internal vaginal assessment this date and found to have decreased strength, coordination, and endurance with dryness noted internally and external at perineal area. Pt needed extra time and NMRE training for improved pelvic floor muscle recruitment for improved technique with contraction of pelvic floor. Pt tolerated well without pain, and improved technique with reps. Pt would benefit from additional PT to further address deficits.      OBJECTIVE IMPAIRMENTS decreased coordination, decreased endurance, decreased strength, impaired flexibility, impaired tone, improper body mechanics, and postural dysfunction.   ACTIVITY LIMITATIONS carrying and squatting  PARTICIPATION LIMITATIONS: community activity  PERSONAL FACTORS 1-2 comorbidities: x2 vaginal births, decreased estrogen, medical history   are also affecting patient's functional outcome.   REHAB POTENTIAL: Good  CLINICAL DECISION MAKING: Stable/uncomplicated  EVALUATION COMPLEXITY: Low   GOALS: Goals reviewed with patient? Yes  SHORT TERM GOALS: Target date: 11/02/2021  Pt to be I with HEP.  Baseline: Goal status: INITIAL  2.  Pt to demonstrate improved coordination of pelvic floor and breathing mechanics of body weight squat without strain at pelvic floor and prolapse.  Baseline:  Goal status: INITIAL  3.  Pt to demonstrate at least 3/5 pelvic floor strength for improved pelvic stability and decreased  strain at pelvic floor/ prolapse. Baseline:  Goal status: INITIAL  4.  Pt to be I with voiding, breathing, and lifting mechanics to decrease strain at prolapse and pelvic floor.  Baseline:  Goal status: INITIAL    LONG TERM GOALS: Target date:  01/05/22    Pt to be I with advanced HEP.  Baseline:  Goal status: INITIAL  2.  .Pt to demonstrate at least 4/5 pelvic floor strength for improved pelvic stability and decreased strain at pelvic floor/ prolapse. Baseline:  Goal status: INITIAL  3.  Pt to report 75% reduction in prolapse symptoms due to improved pelvic floor strengthening and coordination of pelvic floor to decrease strain during activity.  Baseline:  Goal status: INITIAL  4.  Pt to demonstrate at least 5/5 bil hip strength for improved pelvic stability and functional squats without prolapse symptoms.  Baseline:  Goal status: INITIAL  PLAN: PT FREQUENCY: 1x/week  PT DURATION:  8 sessions  PLANNED INTERVENTIONS: Therapeutic exercises, Therapeutic activity, Neuromuscular re-education, Patient/Family education, Joint mobilization, Spinal mobilization, Cryotherapy, Moist heat, Manual lymph drainage, scar mobilization, Taping, Biofeedback, and Manual therapy  PLAN FOR NEXT SESSION: coordination of pelvic floor and breathing mechanics with exercises, voiding mechanics, squatty potty use, HEP review   Stacy Gardner, PT, DPT 06/15/235:01 PM

## 2021-10-06 ENCOUNTER — Other Ambulatory Visit: Payer: Self-pay

## 2021-10-06 ENCOUNTER — Inpatient Hospital Stay: Payer: Managed Care, Other (non HMO) | Attending: Hematology and Oncology

## 2021-10-06 VITALS — BP 117/68 | HR 77 | Temp 97.7°F | Resp 18 | Wt 126.5 lb

## 2021-10-06 DIAGNOSIS — C50411 Malignant neoplasm of upper-outer quadrant of right female breast: Secondary | ICD-10-CM | POA: Insufficient documentation

## 2021-10-06 DIAGNOSIS — Z5112 Encounter for antineoplastic immunotherapy: Secondary | ICD-10-CM | POA: Insufficient documentation

## 2021-10-06 DIAGNOSIS — Z170 Estrogen receptor positive status [ER+]: Secondary | ICD-10-CM

## 2021-10-06 DIAGNOSIS — Z95828 Presence of other vascular implants and grafts: Secondary | ICD-10-CM

## 2021-10-06 MED ORDER — ZOLEDRONIC ACID 4 MG/100ML IV SOLN
4.0000 mg | Freq: Once | INTRAVENOUS | Status: AC
  Administered 2021-10-06: 4 mg via INTRAVENOUS
  Filled 2021-10-06: qty 100

## 2021-10-06 MED ORDER — DIPHENHYDRAMINE HCL 25 MG PO CAPS
50.0000 mg | ORAL_CAPSULE | Freq: Once | ORAL | Status: AC
  Administered 2021-10-06: 50 mg via ORAL
  Filled 2021-10-06: qty 2

## 2021-10-06 MED ORDER — SODIUM CHLORIDE 0.9 % IV SOLN: Freq: Once | INTRAVENOUS | Status: AC

## 2021-10-06 MED ORDER — ACETAMINOPHEN 325 MG PO TABS
650.0000 mg | ORAL_TABLET | Freq: Once | ORAL | Status: AC
  Administered 2021-10-06: 650 mg via ORAL
  Filled 2021-10-06: qty 2

## 2021-10-06 MED ORDER — SODIUM CHLORIDE 0.9% FLUSH
10.0000 mL | INTRAVENOUS | Status: DC | PRN
  Administered 2021-10-06: 10 mL

## 2021-10-06 MED ORDER — TRASTUZUMAB-ANNS CHEMO 150 MG IV SOLR
6.0000 mg/kg | Freq: Once | INTRAVENOUS | Status: AC
  Administered 2021-10-06: 336 mg via INTRAVENOUS
  Filled 2021-10-06: qty 16

## 2021-10-06 MED ORDER — HEPARIN SOD (PORK) LOCK FLUSH 100 UNIT/ML IV SOLN
500.0000 [IU] | Freq: Once | INTRAVENOUS | Status: AC | PRN
  Administered 2021-10-06: 500 [IU]

## 2021-10-06 NOTE — Patient Instructions (Signed)
[facility] MEDICAL [facility]COLOGY  Discharge Instructions: °Thank you for choosing [facility] to provide your oncology and hematology care.  ° °If you have a lab appointment with the Cancer Center, please go directly to the Cancer Center and check in at the registration area. °  °Wear comfortable clothing and clothing appropriate for easy access to any Portacath or PICC line.  ° °We strive to give you quality time with your provider. You may need to reschedule your appointment if you arrive late (15 or more minutes).  Arriving late affects you and other patients whose appointments are after yours.  Also, if you miss three or more appointments without notifying the office, you may be dismissed from the clinic at the provider’s discretion.    °  °For prescription refill requests, have your pharmacy contact our office and allow 72 hours for refills to be completed.   ° °Today you received the following chemotherapy and/or immunotherapy agent: Trastuzumab (Kanjinti). °  °To help prevent nausea and vomiting after your treatment, we encourage you to take your nausea medication as directed. ° °BELOW ARE SYMPTOMS THAT SHOULD BE REPORTED IMMEDIATELY: °*FEVER GREATER THAN 100.4 F (38 °C) OR HIGHER °*CHILLS OR SWEATING °*NAUSEA AND VOMITING THAT IS NOT CONTROLLED WITH YOUR NAUSEA MEDICATION °*UNUSUAL SHORTNESS OF BREATH °*UNUSUAL BRUISING OR BLEEDING °*URINARY PROBLEMS (pain or burning when urinating, or frequent urination) °*BOWEL PROBLEMS (unusual diarrhea, constipation, pain near the anus) °TENDERNESS IN MOUTH AND THROAT WITH OR WITHOUT PRESENCE OF ULCERS (sore throat, sores in mouth, or a toothache) °UNUSUAL RASH, SWELLING OR PAIN  °UNUSUAL VAGINAL DISCHARGE OR ITCHING  ° °Items with * indicate a potential emergency and should be followed up as soon as possible or go to the Emergency Department if any problems should occur. ° °Please show the CHEMOTHERAPY ALERT CARD or IMMUNOTHERAPY ALERT CARD at  check-in to the Emergency Department and triage nurse. ° °Should you have questions after your visit or need to cancel or reschedule your appointment, please contact [facility]  Dept: [phone]  and follow the prompts.  Office hours are 8:00 a.m. to 4:30 p.m. Monday - Friday. Please note that voicemails left after 4:00 p.m. may not be returned until the following business day.  We are closed weekends and major holidays. You have access to a nurse at all times for urgent questions. Please call the main number to the clinic Dept: [phone] and follow the prompts. ° ° °For any non-urgent questions, you may also contact your provider using MyChart. We now offer e-Visits for anyone 18 and older to request care online for non-urgent symptoms. For details visit [email or web address]. °  °Also download the MyChart app! Go to the app store, search "MyChart", open the app, select [facility], and log in with your MyChart username and password. ° °Due to Covid, a mask is required upon entering the hospital/clinic. If you do not have a mask, one will be given to you upon arrival. For doctor visits, patients may have 1 support Travaris Kosh aged 18 or older with them. For treatment visits, patients cannot have anyone with them due to current Covid guidelines and our immunocompromised population.  ° °

## 2021-10-10 ENCOUNTER — Ambulatory Visit (HOSPITAL_COMMUNITY)
Admission: RE | Admit: 2021-10-10 | Discharge: 2021-10-10 | Disposition: A | Discharge status: Home / Self Care | Payer: Managed Care, Other (non HMO) | Source: Ambulatory Visit | Attending: Hematology and Oncology | Admitting: Hematology and Oncology

## 2021-10-10 DIAGNOSIS — Z0189 Encounter for other specified special examinations: Secondary | ICD-10-CM

## 2021-10-10 DIAGNOSIS — Z170 Estrogen receptor positive status [ER+]: Secondary | ICD-10-CM | POA: Diagnosis not present

## 2021-10-10 DIAGNOSIS — Z01818 Encounter for other preprocedural examination: Secondary | ICD-10-CM | POA: Insufficient documentation

## 2021-10-10 DIAGNOSIS — C50411 Malignant neoplasm of upper-outer quadrant of right female breast: Secondary | ICD-10-CM | POA: Diagnosis not present

## 2021-10-10 DIAGNOSIS — Z5111 Encounter for antineoplastic chemotherapy: Secondary | ICD-10-CM

## 2021-10-10 LAB — ECHOCARDIOGRAM COMPLETE
AR max vel: 2.55 cm2
AV Peak grad: 5.2 mmHg
Ao pk vel: 1.14 m/s
Area-P 1/2: 3.42 cm2
S' Lateral: 2.5 cm

## 2021-10-11 ENCOUNTER — Encounter: Payer: Self-pay | Admitting: Radiation Oncology

## 2021-10-11 NOTE — Progress Notes (Incomplete)
  Radiation Oncology         [phone] ________________________________  Patient Name: Susan Reid MRN: [id number] DOB: 15-Jul-1965 Referring Physician: Waunita Schooner Date of Service: 09/13/2021 [facility] [place]                                                        End Of Treatment Note  Diagnoses: C50.411-Malignant neoplasm of upper-outer quadrant of right female breast Z17.0-Estrogen receptor positive status [ER+]  Cancer Staging:  S/p right lumpectomy and chemotherapy: Stage IA (pT1c, pN0, cM0) Right Breast UOQ, Invasive Ductal Carcinoma, ER+ / PR- / Her2+, Grade 3  Intent: Curative  Radiation Treatment Dates: 08/16/2021 through 09/13/2021 Site Technique Total Dose (Gy) Dose per Fx (Gy) Completed Fx Beam Energies  Breast, Right: Breast_R 3D 40.05/40.05 2.67 15/15 10X  Breast, Right: Breast_R_Bst 3D 12/12 2 6/6 6X, 10X   Narrative: The patient tolerated radiation therapy quite well. During her final weekly treatment check on 09/12/21, the patient reported very mild intermittent fatigue in the evening hours. She denied any other symptoms. Physical exam performed on that same date showed minimal erythema and hyperpigmentation changes to the right breast area.    Plan: The patient will follow-up with radiation oncology in one month .  ________________________________________________ -----------------------------------  Blair Promise, PhD, MD  This document serves as a record of services personally performed by Gery Pray, MD. It was created on his behalf by Roney Mans, a trained medical scribe. The creation of this record is based on the scribe's personal observations and the provider's statements to them. This document has been checked and approved by the attending provider.

## 2021-10-16 ENCOUNTER — Ambulatory Visit
Admission: RE | Admit: 2021-10-16 | Discharge: 2021-10-16 | Disposition: A | Discharge status: Home / Self Care | Payer: Managed Care, Other (non HMO) | Source: Ambulatory Visit | Attending: Radiation Oncology | Admitting: Radiation Oncology

## 2021-10-16 ENCOUNTER — Other Ambulatory Visit: Payer: Self-pay

## 2021-10-16 DIAGNOSIS — Z170 Estrogen receptor positive status [ER+]: Secondary | ICD-10-CM | POA: Insufficient documentation

## 2021-10-16 DIAGNOSIS — C50411 Malignant neoplasm of upper-outer quadrant of right female breast: Secondary | ICD-10-CM | POA: Insufficient documentation

## 2021-10-16 HISTORY — DX: Personal history of irradiation: Z92.3

## 2021-10-16 NOTE — Progress Notes (Addendum)
Susan Reid is here today for follow up post radiation to the breast.   Breast Side:right breast   They completed their radiation on: 09/13/2021   Does the patient complain of any of the following: Post radiation skin issues: no Breast Tenderness: no Breast Swelling: no Lymphadema: right hand which is improving Range of Motion limitations: none  Fatigue post radiation: very little Appetite good/fair/poor: normal  Additional comments if applicable: None noted.  BP 120/71 (Patient Position: Sitting)   Pulse 74   Temp 98.6 F (37 C) (Oral)   Resp 17   Wt 126 lb 9.6 oz (57.4 kg)   LMP 12/20/2017   SpO2 99%   BMI 21.07 kg/m

## 2021-10-26 ENCOUNTER — Ambulatory Visit: Payer: Managed Care, Other (non HMO) | Attending: Urology | Admitting: Physical Therapy

## 2021-10-26 DIAGNOSIS — R279 Unspecified lack of coordination: Secondary | ICD-10-CM | POA: Diagnosis present

## 2021-10-26 DIAGNOSIS — M6281 Muscle weakness (generalized): Secondary | ICD-10-CM | POA: Insufficient documentation

## 2021-10-26 DIAGNOSIS — M62838 Other muscle spasm: Secondary | ICD-10-CM | POA: Diagnosis present

## 2021-10-26 NOTE — Therapy (Addendum)
OUTPATIENT PHYSICAL THERAPY FEMALE PELVIC TREATMENT   Patient Name: Susan Reid MRN: [id number] DOB:[birth year], 56 y.o., female Today's Date: 10/26/2021   PT End of Session - 10/26/21 1058     Visit Number 21   pf 2   Date for PT Re-Evaluation 01/05/22    Authorization Type cigna managed    PT Start Time 63    PT Stop Time 1146    PT Time Calculation (min) 46 min    Activity Tolerance Patient tolerated treatment well    Behavior During Therapy WFL for tasks assessed/performed             Past Medical History:  Diagnosis Date   Allergy    Family history of adverse reaction to anesthesia    Mother got severe N/V and pneumonia following anesthesia   History of radiation therapy    Right breast- 08/16/21-09/13/21- Dr. Gery Reid   Osteopenia    Past Surgical History:  Procedure Laterality Date   AXILLARY SENTINEL NODE BIOPSY Right 04/20/2021   Procedure: RIGHT AXILLARY SENTINEL NODE BIOPSY;  Surgeon: Susan Klein, MD;  Location: [facility];  Service: General;  Laterality: Right;   BREAST BIOPSY Right 02/25/2020   [place]   BREAST BIOPSY Right 03/14/2021   BREAST LUMPECTOMY WITH SENTINEL LYMPH NODE BIOPSY Right 04/20/2021   Procedure: RIGHT BREAST LUMPECTOMY;  Surgeon: Susan Klein, MD;  Location: [facility];  Service: General;  Laterality: Right;   PORTACATH PLACEMENT Left 04/20/2021   Procedure: INSERTION PORT-A-CATH;  Surgeon: Susan Klein, MD;  Location: [facility];  Service: General;  Laterality: Left;   [place] EXTRACTION  04/24/1987   Patient Active Problem List   Diagnosis Date Noted   Port-A-Cath in place 05/18/2021   Genetic testing 04/03/2021   Family history of breast cancer 03/23/2021   Malignant neoplasm of upper-outer quadrant of right breast in female, estrogen receptor positive ([place]) 03/20/2021   Osteoporosis 12/10/2018    PCP: Susan Noe, MD  REFERRING PROVIDER: Robley Fries, MD  REFERRING DIAG: N81.11 (ICD-10-CM) - Cystocele,  midline N95.2 (ICD-10-CM) - Postmenopausal atrophic vaginitis  THERAPY DIAG:  Muscle weakness (generalized)  Unspecified lack of coordination  Other muscle spasm  Rationale for Evaluation and Treatment Rehabilitation  ONSET DATE: 4 weeks ago  SUBJECTIVE:                                                                                                                                                                                           SUBJECTIVE STATEMENT: Pt reports she has seen an improvement with prolapse, also cream MD ave her has been helping with moisture overall at  feminine area. Pt no longer notices bulge until later in the evenings, does walk in the morning and doesn't notice it then. Relief positions help a lot   Fluid intake: Yes: 60-70oz per day; coffee 2 cups in AM     PAIN:  Are you having pain? No  PRECAUTIONS: Other: CA patient  WEIGHT BEARING RESTRICTIONS No  FALLS:  Has patient fallen in last 6 months? No  LIVING ENVIRONMENT: Lives with: lives with their spouse Lives in: House/apartment   OCCUPATION: Surveyor, quantity, desk job  PLOF: Independent  PATIENT GOALS to have less of the prolapse feeling  PERTINENT HISTORY:  Patient was diagnosed on 03/07/2021 with 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 She had a right lumpectomy and senitnel node biopsy (5 negative nodes) on 04/20/2021. It is ER positive, PR negative, and HER2 positive with a Ki67 of 80%. Pt reports she is now finished with chemo/radiation treatments but does continue immunosuppression treatments.  Sexual abuse: No  BOWEL MOVEMENT Pain with bowel movement: No Type of bowel movement:Type (Bristol Stool Scale) 4, Frequency daily, and Strain No Fully empty rectum: No Leakage: No Pads: No Fiber supplement: No  URINATION Pain with urination: No Fully empty bladder: Yes:   Stream: Strong Urgency: No Frequency: not sooner than every 2 hours, none at  night Leakage:  double urinating rarely Pads: No  INTERCOURSE Pain with intercourse:  None, uses lubricant Ability to have vaginal penetration:  Yes:   Climax: not painful Susan Reid Scale: 0/3  PREGNANCY Vaginal deliveries 2 Tearing Yes: with first C-section deliveries 0 Currently pregnant No  PROLAPSE Cystocele "slight" per pt - started with a sensation of "falling out" but then could feel it with hand internally.     OBJECTIVE:   DIAGNOSTIC FINDINGS:    COGNITION:  Overall cognitive status: Within functional limits for tasks assessed     SENSATION:  Light touch: Appears intact  Proprioception: Appears intact  MUSCLE LENGTH: Bil hamstrings and adductors limited by 25%                POSTURE: rounded shoulders and posterior pelvic tilt   LUMBARAROM/PROM    LOWER EXTREMITY ROM:  WFL  LOWER EXTREMITY MMT:  Rt hip abduction 3+/5, all other Rt 4/5; Lt hip grossly 4+/5; knees and ankles 5/5   PALPATION:   General  no TTP                External Perineal Exam no TTP, mild dryness noted at vulva                              Internal Pelvic Floor no TTP, mild dryness noted  Patient confirms identification and approves PT to assess internal pelvic floor and treatment Yes  PELVIC MMT:   MMT eval  Vaginal 0/5 initially with max cues and extra time and reps 2/5; 6s isometric; 4 reps  Internal Anal Sphincter   External Anal Sphincter   Puborectalis   Diastasis Recti   (Blank rows = not tested)        TONE: Decreased   PROLAPSE: Possible grade 1 anterior and posterior wall laxity in hooklying with strong cough, potentially grade two in standing due to gravity and pt reporting symptoms worse in standing  TODAY'S TREATMENT   10/26/21: Susan Reid with ball squeezes 2x10 Same hand/ball press 2x10 Sidelying hip abduction with ball press 2x10 Hip flexion with red band 2x20 X5 Sit to  stand from mat table All exercises cued for breathing mechanics and  pelvic floor coordination.  HEP also updated and reviewed with pt     10/05/2021 EVAL Examination completed, findings reviewed, pt educated on POC, HEP, and prolapse relief positions. Pt motivated to participate in PT and agreeable to attempt recommendations.     PATIENT EDUCATION:  Education details: [id number] Person educated: Patient Education method: Explanation, Demonstration, Tactile cues, Verbal cues, and Handouts Education comprehension: verbalized understanding and returned demonstration   HOME EXERCISE PROGRAM: [id number]  ASSESSMENT:  CLINICAL IMPRESSION: Patient presents for first follow up since eval, reports she has notices a difference in prolapse symptoms, now only noticing them in evening and relief positions help a lot. Pt also has been using a vaginal suppository from MD non estrogen based, that has helped with dryness and also her symptoms. Pt session focused on hip and core strengthening with coordination of pelvic floor and breathing mechanics to decrease strain at prolapse and pelvic floor. Pt tolerated well denied symptoms. HEP also updated. Pt would benefit from additional PT to further address deficits as she still has prolapse symptoms, and would benefit from training for lifting as tolerating and core strengthening to decease strain at pelvic floor. .      OBJECTIVE IMPAIRMENTS decreased coordination, decreased endurance, decreased strength, impaired flexibility, impaired tone, improper body mechanics, and postural dysfunction.   ACTIVITY LIMITATIONS carrying and squatting  PARTICIPATION LIMITATIONS: community activity  PERSONAL FACTORS 1-2 comorbidities: x2 vaginal births, decreased estrogen, medical history   are also affecting patient's functional outcome.   REHAB POTENTIAL: Good  CLINICAL DECISION MAKING: Stable/uncomplicated  EVALUATION COMPLEXITY: Low   GOALS: Goals reviewed with patient? Yes  SHORT TERM GOALS: Target date: 11/02/2021  Pt to be I  with HEP.  Baseline: Goal status: INITIAL  2.  Pt to demonstrate improved coordination of pelvic floor and breathing mechanics of body weight squat without strain at pelvic floor and prolapse.  Baseline:  Goal status: INITIAL  3.  Pt to demonstrate at least 3/5 pelvic floor strength for improved pelvic stability and decreased strain at pelvic floor/ prolapse. Baseline:  Goal status: INITIAL  4.  Pt to be I with voiding, breathing, and lifting mechanics to decrease strain at prolapse and pelvic floor.  Baseline:  Goal status: INITIAL    LONG TERM GOALS: Target date:  01/05/22    Pt to be I with advanced HEP.  Baseline:  Goal status: INITIAL  2.  .Pt to demonstrate at least 4/5 pelvic floor strength for improved pelvic stability and decreased strain at pelvic floor/ prolapse. Baseline:  Goal status: INITIAL  3.  Pt to report 75% reduction in prolapse symptoms due to improved pelvic floor strengthening and coordination of pelvic floor to decrease strain during activity.  Baseline:  Goal status: INITIAL  4.  Pt to demonstrate at least 5/5 bil hip strength for improved pelvic stability and functional squats without prolapse symptoms.  Baseline:  Goal status: INITIAL  PLAN: PT FREQUENCY: 1x/week  PT DURATION:  8 sessions  PLANNED INTERVENTIONS: Therapeutic exercises, Therapeutic activity, Neuromuscular re-education, Patient/Family education, Joint mobilization, Spinal mobilization, Cryotherapy, Moist heat, Manual lymph drainage, scar mobilization, Taping, Biofeedback, and Manual therapy  PLAN FOR NEXT SESSION: coordination of pelvic floor and breathing mechanics with exercises, voiding mechanics, squatty potty use  Stacy Gardner, PT, DPT 10/26/2309:54 AM   PHYSICAL THERAPY DISCHARGE SUMMARY  Visits from Start of Care: 2 for pelvic floor and 21 total  Current functional level related to  goals / functional outcomes: Unable to formally reassess as pt unable to continue PT  due to max visits   Remaining deficits: Unable to formally reassess   Education / Equipment: HEP   Patient agrees to discharge. Patient goals were partially met. Patient is being discharged due to  max visit limit met.  Stacy Gardner, PT, DPT 08/31/238:39 AM

## 2021-10-27 ENCOUNTER — Inpatient Hospital Stay: Payer: Managed Care, Other (non HMO) | Admitting: Adult Health

## 2021-10-27 ENCOUNTER — Inpatient Hospital Stay: Payer: Managed Care, Other (non HMO)

## 2021-10-27 ENCOUNTER — Inpatient Hospital Stay: Payer: Managed Care, Other (non HMO) | Attending: Hematology and Oncology

## 2021-10-27 ENCOUNTER — Inpatient Hospital Stay (HOSPITAL_BASED_OUTPATIENT_CLINIC_OR_DEPARTMENT_OTHER): Payer: Managed Care, Other (non HMO) | Admitting: Adult Health

## 2021-10-27 ENCOUNTER — Other Ambulatory Visit: Payer: Self-pay

## 2021-10-27 ENCOUNTER — Encounter: Payer: Self-pay | Admitting: Surgery

## 2021-10-27 VITALS — BP 121/70 | HR 62 | Temp 97.7°F | Wt 125.6 lb

## 2021-10-27 DIAGNOSIS — Z170 Estrogen receptor positive status [ER+]: Secondary | ICD-10-CM | POA: Insufficient documentation

## 2021-10-27 DIAGNOSIS — M810 Age-related osteoporosis without current pathological fracture: Secondary | ICD-10-CM | POA: Insufficient documentation

## 2021-10-27 DIAGNOSIS — C50411 Malignant neoplasm of upper-outer quadrant of right female breast: Secondary | ICD-10-CM

## 2021-10-27 DIAGNOSIS — Z79811 Long term (current) use of aromatase inhibitors: Secondary | ICD-10-CM | POA: Diagnosis not present

## 2021-10-27 DIAGNOSIS — Z95828 Presence of other vascular implants and grafts: Secondary | ICD-10-CM

## 2021-10-27 DIAGNOSIS — Z5112 Encounter for antineoplastic immunotherapy: Secondary | ICD-10-CM | POA: Insufficient documentation

## 2021-10-27 LAB — CMP (CANCER CENTER ONLY)
ALT: 18 U/L (ref 0–44)
AST: 22 U/L (ref 15–41)
Albumin: 4.4 g/dL (ref 3.5–5.0)
Alkaline Phosphatase: 79 U/L (ref 38–126)
Anion gap: 5 (ref 5–15)
BUN: 17 mg/dL (ref 6–20)
CO2: 28 mmol/L (ref 22–32)
Calcium: 9.4 mg/dL (ref 8.9–10.3)
Chloride: 108 mmol/L (ref 98–111)
Creatinine: 0.73 mg/dL (ref 0.44–1.00)
GFR, Estimated: >60 mL/min (ref >60)
Glucose, Bld: 97 mg/dL (ref 70–99)
Potassium: 4.1 mmol/L (ref 3.5–5.1)
Sodium: 141 mmol/L (ref 135–145)
Total Bilirubin: 0.4 mg/dL (ref 0.3–1.2)
Total Protein: 7 g/dL (ref 6.5–8.1)

## 2021-10-27 LAB — CBC WITH DIFFERENTIAL (CANCER CENTER ONLY)
Abs Immature Granulocytes: 0 10*3/uL (ref 0.00–0.07)
Basophils Absolute: 0 10*3/uL (ref 0.0–0.1)
Basophils Relative: 1 %
Eosinophils Absolute: 0.2 10*3/uL (ref 0.0–0.5)
Eosinophils Relative: 5 %
HCT: 40 % (ref 36.0–46.0)
Hemoglobin: 13.2 g/dL (ref 12.0–15.0)
Immature Granulocytes: 0 %
Lymphocytes Relative: 28 %
Lymphs Abs: 1 10*3/uL (ref 0.7–4.0)
MCH: 30.2 pg (ref 26.0–34.0)
MCHC: 33 g/dL (ref 30.0–36.0)
MCV: 91.5 fL (ref 80.0–100.0)
Monocytes Absolute: 0.4 10*3/uL (ref 0.1–1.0)
Monocytes Relative: 11 %
Neutro Abs: 2 10*3/uL (ref 1.7–7.7)
Neutrophils Relative %: 55 %
Platelet Count: 221 10*3/uL (ref 150–400)
RBC: 4.37 MIL/uL (ref 3.87–5.11)
RDW: 12.2 % (ref 11.5–15.5)
WBC Count: 3.6 10*3/uL — ABNORMAL LOW (ref 4.0–10.5)
nRBC: 0 % (ref 0.0–0.2)

## 2021-10-27 MED ORDER — SODIUM CHLORIDE 0.9% FLUSH
10.0000 mL | Freq: Once | INTRAVENOUS | Status: AC
  Administered 2021-10-27: 10 mL

## 2021-10-27 MED ORDER — SODIUM CHLORIDE 0.9 % IV SOLN: Freq: Once | INTRAVENOUS | Status: AC

## 2021-10-27 MED ORDER — HEPARIN SOD (PORK) LOCK FLUSH 100 UNIT/ML IV SOLN
500.0000 [IU] | Freq: Once | INTRAVENOUS | Status: AC | PRN
  Administered 2021-10-27: 500 [IU]

## 2021-10-27 MED ORDER — SODIUM CHLORIDE 0.9% FLUSH
10.0000 mL | INTRAVENOUS | Status: DC | PRN
  Administered 2021-10-27: 10 mL

## 2021-10-27 MED ORDER — DIPHENHYDRAMINE HCL 25 MG PO CAPS
50.0000 mg | ORAL_CAPSULE | Freq: Once | ORAL | Status: AC
  Administered 2021-10-27: 50 mg via ORAL
  Filled 2021-10-27: qty 2

## 2021-10-27 MED ORDER — TRASTUZUMAB-ANNS CHEMO 150 MG IV SOLR
6.0000 mg/kg | Freq: Once | INTRAVENOUS | Status: AC
  Administered 2021-10-27: 336 mg via INTRAVENOUS
  Filled 2021-10-27: qty 16

## 2021-10-27 MED ORDER — ACETAMINOPHEN 325 MG PO TABS
650.0000 mg | ORAL_TABLET | Freq: Once | ORAL | Status: AC
  Administered 2021-10-27: 650 mg via ORAL
  Filled 2021-10-27: qty 2

## 2021-10-27 NOTE — Progress Notes (Signed)
Per Wilber Bihari, NP, I called the [facility] to schedule the pt for a diagnostic mammogram and left breast ultrasound, due to a new left breast nodule.  The pt is scheduled for this Monday, July 10, at 2:30 and she needs to arrive at 2:15.  I notified the pt's infusion nurse Jerene Pitch, who gave the pt the message.

## 2021-10-27 NOTE — Progress Notes (Signed)
SURVIVORSHIP VISIT:   BRIEF ONCOLOGIC HISTORY:  Oncology History  Malignant neoplasm of upper-outer quadrant of right breast in female, estrogen receptor positive ([place])  03/14/2021 Initial Diagnosis   Palpable right breast mass diagnostic mammogram: 1.6 cm mass in the right breast at 9 o'clock. 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    03/22/2021 Cancer Staging   Staging form: Breast, AJCC 8th Edition - Clinical stage from 03/22/2021: Stage IA (cT1b, cN0, cM0, G3, ER+, PR-, HER2+) - Signed by [name], MD on 03/22/2021 Stage prefix: Initial diagnosis Histologic grading system: 3 grade system    Genetic Testing   [facility] CustomNext Panel was Negative. Report date is 04/03/2021.  The CustomNext-Cancer+RNAinsight panel offered by [facility] includes sequencing and rearrangement analysis for the following 47 genes:  APC, ATM, AXIN2, BARD1, BMPR1A, BRCA1, BRCA2, BRIP1, CDH1, CDK4, CDKN2A, CHEK2, CTNNA1, DICER1, EPCAM, GREM1, HOXB13, KIT, MEN1, MLH1, MSH2, MSH3, MSH6, MUTYH, NBN, NF1, NTHL1, PALB2, PDGFRA, PMS2, POLD1, POLE, PTEN, RAD50, RAD51C, RAD51D, SDHA, SDHB, SDHC, SDHD, SMAD4, SMARCA4, STK11, TP53, TSC1, TSC2, and VHL.  RNA data is routinely analyzed for use in variant interpretation for all genes.   04/20/2021 Surgery   Rt Lumpectomy: Grade 3 IDC 1.9 cm 0/5 Ln neg, Margins Neg, Her2+, Copy #5.85, ratio 3.16 ER+(60%)/PR+(0%), Ki-67 80%   04/20/2021 Cancer Staging   Staging form: Breast, AJCC 8th Edition - Pathologic stage from 04/20/2021: Stage IA (pT1c, pN0, cM0, G3, ER+, PR-, HER2+) - Signed by [name], NP on 06/30/2021 Stage prefix: Initial diagnosis Histologic grading system: 3 grade system   05/18/2021 -  Chemotherapy   Patient is on Treatment Plan : BREAST Paclitaxel + Trastuzumab q7d / Trastuzumab q21d     08/16/2021 - 09/13/2021 Radiation Therapy   Site Technique Total Dose (Gy) Dose per Fx  (Gy) Completed Fx Beam Energies  Breast, Right: Breast_R 3D 40.05/40.05 2.67 15/15 10X  Breast, Right: Breast_R_Bst 3D 12/12 2 6/6 6X, 10X     10/05/2021 -  Anti-estrogen oral therapy   Letrozole x 5-7 years     INTERVAL HISTORY:  Ms. Hetzer to review her survivorship care plan detailing her treatment course for breast cancer, as well as monitoring long-term side effects of that treatment, education regarding health maintenance, screening, and overall wellness and health promotion.     Overall, Ms. Goh reports feeling quite well.  She continues on Herceptin every 3 weeks and is also taking letrozole which she is tolerating moderately well.  Her most recent echocardiogram was completed on October 10, 2021 which showed an ejection fraction of 60 to 65%.  Global longitudinal strain was also normal.  REVIEW OF SYSTEMS:  Review of Systems  Constitutional:  Negative for appetite change, chills, fatigue, fever and unexpected weight change.  HENT:   Negative for hearing loss, lump/mass and trouble swallowing.   Eyes:  Negative for eye problems and icterus.  Respiratory:  Negative for chest tightness, cough and shortness of breath.   Cardiovascular:  Negative for chest pain, leg swelling and palpitations.  Gastrointestinal:  Negative for abdominal distention, abdominal pain, constipation, diarrhea, nausea and vomiting.  Endocrine: Negative for hot flashes.  Genitourinary:  Negative for difficulty urinating.   Musculoskeletal:  Negative for arthralgias.  Skin:  Negative for itching and rash.  Neurological:  Negative for dizziness, extremity weakness, headaches and numbness.  Hematological:  Negative for adenopathy. Does not bruise/bleed easily.  Psychiatric/Behavioral:  Negative for depression. The patient is not nervous/anxious.  Breast: Denies any new nodularity, masses, tenderness, nipple changes, or nipple discharge.      ONCOLOGY TREATMENT TEAM:  1. Surgeon:  Dr. Barry Dienes at [facility] 2. Medical Oncologist: Dr. Lindi Adie  3. Radiation Oncologist: Dr. Sondra Come    PAST MEDICAL/SURGICAL HISTORY:  Past Medical History:  Diagnosis Date  . Allergy   . Family history of adverse reaction to anesthesia    Mother got severe N/V and pneumonia following anesthesia  . History of radiation therapy    Right breast- 08/16/21-09/13/21- Dr. Gery Pray  . Osteopenia    Past Surgical History:  Procedure Laterality Date  . AXILLARY SENTINEL NODE BIOPSY Right 04/20/2021   Procedure: RIGHT AXILLARY SENTINEL NODE BIOPSY;  Surgeon: Stark Klein, MD;  Location: [facility];  Service: General;  Laterality: Right;  . 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 BREAST BIOPSY Right 03/14/2021  . BREAST LUMPECTOMY WITH SENTINEL LYMPH NODE BIOPSY Right 04/20/2021   Procedure: RIGHT BREAST LUMPECTOMY;  Surgeon: Stark Klein, MD;  Location: [facility];  Service: General;  Laterality: Right;  . PORTACATH PLACEMENT Left 04/20/2021   Procedure: INSERTION PORT-A-CATH;  Surgeon: Stark Klein, MD;  Location: [facility];  Service: General;  Laterality: Left;  . WISDOM TOOTH EXTRACTION  04/24/1987     ALLERGIES:  Allergies  Allergen Reactions  . Nitrates, Organic Anaphylaxis    Throat closes     CURRENT MEDICATIONS:  Outpatient Encounter Medications as of 10/27/2021  Medication Sig  . acetaminophen (TYLENOL) 325 MG tablet Take 1,000 mg by mouth every 6 (six) hours as needed.  . Ascorbic Acid (VITAMIN C PO) Take 500 mg by mouth daily.  . cholecalciferol (VITAMIN D) 1000 UNITS tablet Take 1,000 Units by mouth daily.  . fluticasone (FLONASE) 50 MCG/ACT nasal spray Place 1 spray into both nostrils daily as needed for allergies or rhinitis.  Marland Kitchen letrozole (FEMARA) 2.5 MG tablet Take 1 tablet (2.5 mg total) by mouth daily.  Marland Kitchen lidocaine-prilocaine (EMLA) cream Apply to affected area once  . loratadine (CLARITIN) 10 MG tablet Take 10 mg by mouth daily as needed for allergies.  . Multiple Vitamin  (MULTIVITAMIN) tablet Take 1 tablet by mouth daily.  . Calcium Citrate 1040 MG TABS Take 2,080 mg of elemental calcium by mouth daily. (Patient not taking: Reported on 10/27/2021)  . LORazepam (ATIVAN) 0.5 MG tablet Take 1 tablet (0.5 mg total) by mouth at bedtime as needed (Nausea or vomiting). (Patient not taking: Reported on 10/16/2021)   No facility-administered encounter medications on file as of 10/27/2021.     ONCOLOGIC FAMILY HISTORY:  Family History  Problem Relation Age of Onset  . Arthritis Mother   . Hyperlipidemia Mother   . Heart disease Mother   . Stroke Mother 73  . Hypertension Mother   . Kidney disease Mother   . Breast cancer Mother 23  . Cirrhosis Mother   . Mental illness Father   . Depression Father   . Lung cancer Maternal Aunt 32  . Lung cancer Maternal Uncle 75  . Cancer Maternal Grandfather        Oral  - smoker  . Lung cancer Maternal Grandfather   . Cancer Paternal Grandmother        unknown type  . Prostate cancer Paternal Grandfather   . Breast cancer Maternal Great-grandmother   . Colon cancer Neg Hx   . Colon polyps Neg Hx   . Esophageal cancer Neg Hx   . Rectal cancer Neg Hx   .  Stomach cancer Neg Hx      GENETIC COUNSELING/TESTING: ***  SOCIAL HISTORY:  Social History   Socioeconomic History  . Marital status: Married    Spouse name: [name]  . Number of children: 2  . Years of education: High school  . Highest education level: Not on file  Occupational History  . Not on file  Tobacco Use  . Smoking status: Never  . Smokeless tobacco: Never  Vaping Use  . Vaping Use: Never used  Substance and Sexual Activity  . Alcohol use: Yes    Comment: one a month or less  . Drug use: No  . Sexual activity: Yes    Birth control/protection: Post-menopausal  Other Topics Concern  . Not on file  Social History Narrative   10/28/19   From: the area   Living: with Husband, Don   Work: Nature conservation officer       Family: 2 children - Psychologist, educational (nurse at [employer]) and [name] (family medicine resident in [place]) - both married 2020 and 2021      Enjoys: work outside in the yard, hiking      Exercise: walking daily, was going to the gym   Diet: diabetic diet - avoiding added sugar      Safety   Seat belts: Yes    Guns: Yes  and secure   Safe in relationships: Yes    Social Determinants of Radio broadcast assistant Strain: Not on file  Food Insecurity: Not on file  Transportation Needs: Not on file  Physical Activity: Not on file  Stress: Not on file  Social Connections: Not on file  Intimate Partner Violence: Not on file     OBSERVATIONS/OBJECTIVE:  BP 121/70 (Patient Position: Sitting)   Pulse 62   Temp 97.7 F (36.5 C) (Temporal)   Wt 125 lb 9.6 oz (57 kg)   LMP 12/20/2017   SpO2 100%   BMI 20.90 kg/m  GENERAL: Patient is a well appearing female in no acute distress HEENT:  Sclerae anicteric.  Oropharynx clear and moist. No ulcerations or evidence of oropharyngeal candidiasis. Neck is supple.  NODES:  No cervical, supraclavicular, or axillary lymphadenopathy palpated.  BREAST EXAM: thickness in left upper outer breast noted, right breast s/p lumpectomy and radiation no sign of recurrence. LUNGS:  Clear to auscultation bilaterally.  No wheezes or rhonchi. HEART:  Regular rate and rhythm. No murmur appreciated. ABDOMEN:  Soft, nontender.  Positive, normoactive bowel sounds. No organomegaly palpated. MSK:  No focal spinal tenderness to palpation. Full range of motion bilaterally in the upper extremities. EXTREMITIES:  No peripheral edema.   SKIN:  Clear with no obvious rashes or skin changes. No nail dyscrasia. NEURO:  Nonfocal. Well oriented.  Appropriate affect.   LABORATORY DATA:  None for this visit.  DIAGNOSTIC IMAGING:  None for this visit.      ASSESSMENT AND PLAN:  Ms.. Sookdeo is a pleasant 56 y.o. female with Stage IA right breast invasive ductal carcinoma,  ER+/PR-/HER2+, diagnosed in 02/2021, treated with lumpectomy,  adjuvant chemotherapy, maintenance Trastuzumab, adjuvant radiation therapy, and anti-estrogen therapy with Letrozole beginning in 09/2021.  She presents to the Survivorship Clinic for our initial meeting and routine follow-up post-completion of treatment for breast cancer.    1. Stage IA right breast cancer:  Ms. Mahl is continuing to recover from definitive treatment for breast cancer. She will follow-up with her medical oncologist, Dr. Lindi Adie in 11/2021 with history and  physical exam per surveillance protocol.  She will continue her anti-estrogen therapy with Letrozole and her maintenance trastuzumab every 3 weeks to complete one year of treatment.. T. Her mammogram is due 02/2022; orders placed today.  Due to thickness noted in her left breast I placed orders for left breast mammogram and ultrasound to be completed within the week.  Today, a comprehensive survivorship care plan and treatment summary was reviewed with the patient today detailing her breast cancer diagnosis, treatment course, potential late/long-term effects of treatment, appropriate follow-up care with recommendations for the future, and patient education resources.  A copy of this summary, along with a letter will be sent to the patient's primary care provider via mail/fax/In Basket message after today's visit.    2. Bone health:  Given Ms. Gorczyca's age/history of breast cancer and her current treatment regimen including anti-estrogen therapy with Letrozole, she is at risk for bone demineralization.  Her last DEXA scan was 02/06/2021, which showed osteoporosis with a t score of -1.4 in her L1-L4 spine.  In the meantime, she was encouraged to increase her consumption of foods rich in calcium, as well as increase her weight-bearing activities.  She was given education on specific activities to promote bone health.  3. Cancer screening:  Due to Ms. Fenn's history and her age, she  should receive screening for skin cancers, colon cancer, and gynecologic cancers.  The information and recommendations are listed on the patient's comprehensive care plan/treatment summary and were reviewed in detail with the patient.    4. Health maintenance and wellness promotion: Ms. Blattner was encouraged to consume 5-7 servings of fruits and vegetables per day. We reviewed the "Nutrition Rainbow" handout.  She was also encouraged to engage in moderate to vigorous exercise for 30 minutes per day most days of the week. We discussed the [employer] fitness program, which is designed for cancer survivors to help them become more physically fit after cancer treatments.  She was instructed to limit her alcohol consumption and continue to abstain from tobacco use.     5. Support services/counseling: It is not uncommon for this period of the patient's cancer care trajectory to be one of many emotions and stressors.  She was given information regarding our available services and encouraged to contact me with any questions or for help enrolling in any of our support group/programs.    Follow up instructions:    -Return to cancer center every 3 weeks for herceptin  -Mammogram due in  -Follow up with surgery *** -She is welcome to return back to the Survivorship Clinic at any time; no additional follow-up needed at this time.  -Consider referral back to survivorship as a long-term survivor for continued surveillance  The patient was provided an opportunity to ask questions and all were answered. The patient agreed with the plan and demonstrated an understanding of the instructions.   The patient was advised to call back or seek an in-person evaluation if the symptoms worsen or if the condition fails to improve as anticipated.   I provided *** minutes of {Blank single:19197::"face-to-face video visit time","non face-to-face telephone visit time"} during this encounter, and > 50% was spent counseling as  documented under my assessment & plan.  Scot Dock, NP

## 2021-10-30 ENCOUNTER — Ambulatory Visit
Admission: RE | Admit: 2021-10-30 | Discharge: 2021-10-30 | Disposition: A | Discharge status: Home / Self Care | Payer: Managed Care, Other (non HMO) | Source: Ambulatory Visit | Attending: Adult Health | Admitting: Adult Health

## 2021-10-30 DIAGNOSIS — Z170 Estrogen receptor positive status [ER+]: Secondary | ICD-10-CM

## 2021-10-30 DIAGNOSIS — C50411 Malignant neoplasm of upper-outer quadrant of right female breast: Secondary | ICD-10-CM

## 2021-10-31 ENCOUNTER — Ambulatory Visit: Payer: Managed Care, Other (non HMO) | Admitting: Rehabilitation

## 2021-11-01 ENCOUNTER — Encounter: Payer: Self-pay | Admitting: Hematology and Oncology

## 2021-11-01 ENCOUNTER — Encounter: Payer: Self-pay | Admitting: Adult Health

## 2021-11-01 ENCOUNTER — Ambulatory Visit: Payer: Managed Care, Other (non HMO) | Admitting: Physical Therapy

## 2021-11-06 DIAGNOSIS — I890 Lymphedema, not elsewhere classified: Secondary | ICD-10-CM | POA: Insufficient documentation

## 2021-11-08 ENCOUNTER — Ambulatory Visit: Payer: Managed Care, Other (non HMO) | Admitting: Physical Therapy

## 2021-11-13 ENCOUNTER — Other Ambulatory Visit: Payer: Self-pay

## 2021-11-15 ENCOUNTER — Ambulatory Visit: Payer: Managed Care, Other (non HMO) | Admitting: Physical Therapy

## 2021-11-17 ENCOUNTER — Other Ambulatory Visit: Payer: Self-pay

## 2021-11-17 ENCOUNTER — Inpatient Hospital Stay: Payer: Managed Care, Other (non HMO)

## 2021-11-17 VITALS — BP 120/76 | HR 78 | Temp 98.4°F | Resp 16 | Ht 65.0 in | Wt 125.5 lb

## 2021-11-17 DIAGNOSIS — Z170 Estrogen receptor positive status [ER+]: Secondary | ICD-10-CM

## 2021-11-17 DIAGNOSIS — Z5112 Encounter for antineoplastic immunotherapy: Secondary | ICD-10-CM | POA: Diagnosis not present

## 2021-11-17 MED ORDER — SODIUM CHLORIDE 0.9% FLUSH
10.0000 mL | INTRAVENOUS | Status: DC | PRN
  Administered 2021-11-17: 10 mL

## 2021-11-17 MED ORDER — TRASTUZUMAB-ANNS CHEMO 150 MG IV SOLR
6.0000 mg/kg | Freq: Once | INTRAVENOUS | Status: AC
  Administered 2021-11-17: 336 mg via INTRAVENOUS
  Filled 2021-11-17: qty 16

## 2021-11-17 MED ORDER — HEPARIN SOD (PORK) LOCK FLUSH 100 UNIT/ML IV SOLN
500.0000 [IU] | Freq: Once | INTRAVENOUS | Status: AC | PRN
  Administered 2021-11-17: 500 [IU]

## 2021-11-17 MED ORDER — DIPHENHYDRAMINE HCL 25 MG PO CAPS
50.0000 mg | ORAL_CAPSULE | Freq: Once | ORAL | Status: AC
  Administered 2021-11-17: 50 mg via ORAL
  Filled 2021-11-17: qty 2

## 2021-11-17 MED ORDER — SODIUM CHLORIDE 0.9 % IV SOLN: Freq: Once | INTRAVENOUS | Status: AC

## 2021-11-17 MED ORDER — ACETAMINOPHEN 325 MG PO TABS
650.0000 mg | ORAL_TABLET | Freq: Once | ORAL | Status: AC
  Administered 2021-11-17: 650 mg via ORAL
  Filled 2021-11-17: qty 2

## 2021-11-17 NOTE — Progress Notes (Signed)
Per Dr Lindi Adie, no labs needed for Herceptin treatment.

## 2021-11-17 NOTE — Patient Instructions (Signed)
[facility]   Discharge Instructions: Thank you for choosing [facility] to provide your oncology and hematology care.   If you have a lab appointment with the [facility], please go directly to the [facility] and check in at the registration area.   Wear comfortable clothing and clothing appropriate for easy access to any Portacath or PICC line.   We strive to give you quality time with your provider. You may need to reschedule your appointment if you arrive late (15 or more minutes).  Arriving late affects you and other patients whose appointments are after yours.  Also, if you miss three or more appointments without notifying the office, you may be dismissed from the clinic at the provider's discretion.      For prescription refill requests, have your pharmacy contact our office and allow 72 hours for refills to be completed.    Today you received the following chemotherapy and/or immunotherapy agents: trastuzumab-anns      To help prevent nausea and vomiting after your treatment, we encourage you to take your nausea medication as directed.  BELOW ARE SYMPTOMS THAT SHOULD BE REPORTED IMMEDIATELY: *FEVER GREATER THAN 100.4 F (38 C) OR HIGHER *CHILLS OR SWEATING *NAUSEA AND VOMITING THAT IS NOT CONTROLLED WITH YOUR NAUSEA MEDICATION *UNUSUAL SHORTNESS OF BREATH *UNUSUAL BRUISING OR BLEEDING *URINARY PROBLEMS (pain or burning when urinating, or frequent urination) *BOWEL PROBLEMS (unusual diarrhea, constipation, pain near the anus) TENDERNESS IN MOUTH AND THROAT WITH OR WITHOUT PRESENCE OF ULCERS (sore throat, sores in mouth, or a toothache) UNUSUAL RASH, SWELLING OR PAIN  UNUSUAL VAGINAL DISCHARGE OR ITCHING   Items with * indicate a potential emergency and should be followed up as soon as possible or go to the Emergency Department if any problems should occur.  Please show the CHEMOTHERAPY ALERT CARD or IMMUNOTHERAPY ALERT CARD at  check-in to the Emergency Department and triage nurse.  Should you have questions after your visit or need to cancel or reschedule your appointment, please contact [facility]  Dept: [phone]  and follow the prompts.  Office hours are 8:00 a.m. to 4:30 p.m. Monday - Friday. Please note that voicemails left after 4:00 p.m. may not be returned until the following business day.  We are closed weekends and major holidays. You have access to a nurse at all times for urgent questions. Please call the main number to the clinic Dept: [phone] and follow the prompts.   For any non-urgent questions, you may also contact your provider using MyChart. We now offer e-Visits for anyone 37 and older to request care online for non-urgent symptoms. For details visit [email or web address].   Also download the MyChart app! Go to the app store, search "MyChart", open the app, select [facility], and log in with your MyChart username and password.  Masks are optional in the cancer centers. If you would like for your care team to wear a mask while they are taking care of you, please let them know. For doctor visits, patients may have with them one support person who is at least 58 years old. At this time, visitors are not allowed in the infusion area.

## 2021-11-19 ENCOUNTER — Other Ambulatory Visit: Payer: Self-pay

## 2021-11-22 ENCOUNTER — Encounter: Payer: Managed Care, Other (non HMO) | Admitting: Physical Therapy

## 2021-11-29 ENCOUNTER — Encounter: Payer: Managed Care, Other (non HMO) | Admitting: Physical Therapy

## 2021-11-29 ENCOUNTER — Other Ambulatory Visit: Payer: Self-pay

## 2021-12-06 ENCOUNTER — Encounter: Payer: Managed Care, Other (non HMO) | Admitting: Physical Therapy

## 2021-12-07 ENCOUNTER — Encounter: Payer: Self-pay | Admitting: *Deleted

## 2021-12-07 NOTE — Progress Notes (Signed)
Patient Care Team: Lesleigh Noe, MD as PCP - General (Family Medicine) Druscilla Brownie, MD as Consulting Physician (Dermatology) Stark Klein, MD as Consulting Physician (General Surgery) [name], MD as Consulting Physician (Hematology and Oncology) Gery Pray, MD as Consulting Physician (Radiation Oncology)  DIAGNOSIS:  Encounter Diagnoses  Name Primary?   Malignant neoplasm of upper-outer quadrant of right breast in female, estrogen receptor positive ([place])    Encounter for monitoring cardiotoxic drug therapy Yes    SUMMARY OF ONCOLOGIC HISTORY: Oncology History  Malignant neoplasm of upper-outer quadrant of right breast in female, estrogen receptor positive ([place])  03/14/2021 Initial Diagnosis   Palpable right breast mass diagnostic mammogram: 1.6 cm mass in the right breast at 9 o'clock. 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    03/22/2021 Cancer Staging   Staging form: Breast, AJCC 8th Edition - Clinical stage from 03/22/2021: Stage IA (cT1b, cN0, cM0, G3, ER+, PR-, HER2+) - Signed by [name], MD on 03/22/2021 Stage prefix: Initial diagnosis Histologic grading system: 3 grade system    Genetic Testing   Ambry CustomNext Panel was Negative. Report date is 04/03/2021.  The CustomNext-Cancer+RNAinsight panel offered by Althia Forts includes sequencing and rearrangement analysis for the following 47 genes:  APC, ATM, AXIN2, BARD1, BMPR1A, BRCA1, BRCA2, BRIP1, CDH1, CDK4, CDKN2A, CHEK2, CTNNA1, DICER1, EPCAM, GREM1, HOXB13, KIT, MEN1, MLH1, MSH2, MSH3, MSH6, MUTYH, NBN, NF1, NTHL1, PALB2, PDGFRA, PMS2, POLD1, POLE, PTEN, RAD50, RAD51C, RAD51D, SDHA, SDHB, SDHC, SDHD, SMAD4, SMARCA4, STK11, TP53, TSC1, TSC2, and VHL.  RNA data is routinely analyzed for use in variant interpretation for all genes.   04/20/2021 Surgery   Rt Lumpectomy: Grade 3 IDC 1.9 cm 0/5 Ln neg, Margins Neg, Her2+, Copy #5.85,  ratio 3.16 ER+(60%)/PR+(0%), Ki-67 80%   04/20/2021 Cancer Staging   Staging form: Breast, AJCC 8th Edition - Pathologic stage from 04/20/2021: Stage IA (pT1c, pN0, cM0, G3, ER+, PR-, HER2+) - Signed by [name], NP on 06/30/2021 Stage prefix: Initial diagnosis Histologic grading system: 3 grade system   05/18/2021 -  Chemotherapy   Patient is on Treatment Plan : BREAST Paclitaxel + Trastuzumab q7d / Trastuzumab q21d     08/16/2021 - 09/13/2021 Radiation Therapy   Site Technique Total Dose (Gy) Dose per Fx (Gy) Completed Fx Beam Energies  Breast, Right: Breast_R 3D 40.05/40.05 2.67 15/15 10X  Breast, Right: Breast_R_Bst 3D 12/12 2 6/6 6X, 10X     10/05/2021 -  Anti-estrogen oral therapy   Letrozole x 5-7 years     CHIEF COMPLIANT: Follow-up on Herceptin on Letrozole  INTERVAL HISTORY: Susan Reid is a 56 y.o. with above-mentioned history of  breast cancer, currently on  Herceptin and Letrozole. She presents to the clinic today for a follow-up. She states that she is tolerating the herceptin. She also tolerating the letrozole extremely well Deny hot flashes. Hip pain has got better.  ALLERGIES:  is allergic to nitrates, organic.  MEDICATIONS:  Current Outpatient Medications  Medication Sig Dispense Refill   acetaminophen (TYLENOL) 325 MG tablet Take 1,000 mg by mouth every 6 (six) hours as needed.     Ascorbic Acid (VITAMIN C PO) Take 500 mg by mouth daily.     Calcium Citrate 1040 MG TABS Take 2,080 mg of elemental calcium by mouth daily. (Patient not taking: Reported on 10/27/2021)     cholecalciferol (VITAMIN D) 1000 UNITS tablet Take 1,000 Units by mouth daily.     fluticasone (  FLONASE) 50 MCG/ACT nasal spray Place 1 spray into both nostrils daily as needed for allergies or rhinitis.     letrozole (FEMARA) 2.5 MG tablet Take 1 tablet (2.5 mg total) by mouth daily. 90 tablet 3   lidocaine-prilocaine (EMLA) cream Apply to affected area once 30 g 3   loratadine  (CLARITIN) 10 MG tablet Take 10 mg by mouth daily as needed for allergies.     LORazepam (ATIVAN) 0.5 MG tablet Take 1 tablet (0.5 mg total) by mouth at bedtime as needed (Nausea or vomiting). (Patient not taking: Reported on 10/16/2021) 30 tablet 0   Multiple Vitamin (MULTIVITAMIN) tablet Take 1 tablet by mouth daily.     No current facility-administered medications for this visit.    PHYSICAL EXAMINATION: ECOG PERFORMANCE STATUS: 1 - Symptomatic but completely ambulatory  Vitals:   12/08/21 1020  BP: 137/80  Pulse: 74  Temp: 98.1 F (36.7 C)  SpO2: 100%   Filed Weights   12/08/21 1020  Weight: 123 lb 8 oz (56 kg)      LABORATORY DATA:  I have reviewed the data as listed    Latest Ref Rng & Units 10/27/2021    9:30 AM 09/15/2021    9:15 AM 08/29/2021   12:33 PM  CMP  Glucose 70 - 99 mg/dL 97  90  87   BUN 6 - 20 mg/dL 17  14  17    Creatinine 0.44 - 1.00 mg/dL 0.73  0.75  0.71   Sodium 135 - 145 mmol/L 141  143  140   Potassium 3.5 - 5.1 mmol/L 4.1  4.1  4.1   Chloride 98 - 111 mmol/L 108  107  105   CO2 22 - 32 mmol/L 28  30  25    Calcium 8.9 - 10.3 mg/dL 9.4  9.8  9.4   Total Protein 6.5 - 8.1 g/dL 7.0  6.8  7.1   Total Bilirubin 0.3 - 1.2 mg/dL 0.4  0.6  0.7   Alkaline Phos 38 - 126 U/L 79  77  78   AST 15 - 41 U/L 22  22  32   ALT 0 - 44 U/L 18  19  33     Lab Results  Component Value Date   WBC 3.7 (L) 12/08/2021   HGB 13.9 12/08/2021   HCT 41.1 12/08/2021   MCV 88.8 12/08/2021   PLT 246 12/08/2021   NEUTROABS 2.2 12/08/2021    ASSESSMENT & PLAN:  Malignant neoplasm of upper-outer quadrant of right breast in female, estrogen receptor positive ([place]) 03/14/2021:Palpable right breast mass diagnostic mammogram: 1.6 cm mass in the right breast at 9 o'clock. 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  04/20/21: Rt Lumpectomy: Grade 3 IDC 0/5 Ln neg, Margins Neg, Her2+, Copy #5.85, ratio 3.16  ER+(60%)/PR+(0%), Ki-67 80%.   Treatment Plan: 1. adjuvant chemotherapy with Taxol Herceptin followed by Herceptin maintenance started 05/18/2021 2.  Adjuvant radiation therapy completed 09/13/2021 3.  Adjuvant antiestrogen therapy with letrozole started 10/05/2021 Zometa infusion every 6 months x2 years --------------------------------------------------------------------------------------------------------------------------------------------------- Current treatment: Herceptin (until January 2024), letrozole started 10/06/2018 Echocardiogram 07/10/2021: EF 60 to 65% Labs reviewed Toxicities: None   Bladder issues: Referred to urology, seeing Dr. Claudia Desanctis (feels that it is related to prolapse)  Letrozole toxicities: Denies any adverse effects to letrozole Osteoporosis: Patient has a T score of -2.7 in the back and [facility]  Zometa every 6  months for 2 years   We discussed Signatera testing for minimal residual disease after she completes with her treatment.  Patient requested that she only see me for follow-up.  Return to clinic every 3 weeks for Herceptin and every 6 weeks for follow-up with me    Orders Placed This Encounter  Procedures   ECHOCARDIOGRAM COMPLETE    Standing Status:   Future    Standing Expiration Date:   12/09/2022    Order Specific Question:   Where should this test be performed    Answer:   [facility]    Order Specific Question:   Perflutren DEFINITY (image enhancing agent) should be administered unless hypersensitivity or allergy exist    Answer:   Administer Perflutren    Order Specific Question:   Reason for exam-Echo    Answer:   Chemo  [id number]    Order Specific Question:   Other Comments    Answer:   pt needs autherization call pt to schedule   The patient has a good understanding of the overall plan. she agrees with it. she will call with any problems that may develop before the next visit here. Total time spent: 30 mins  including face to face time and time spent for planning, charting and co-ordination of care   Harriette Ohara, MD 12/08/21    I Gardiner Coins am scribing for Dr. Lindi Adie  I have reviewed the above documentation for accuracy and completeness, and I agree with the above.

## 2021-12-08 ENCOUNTER — Inpatient Hospital Stay: Payer: Managed Care, Other (non HMO)

## 2021-12-08 ENCOUNTER — Other Ambulatory Visit: Payer: Self-pay

## 2021-12-08 ENCOUNTER — Inpatient Hospital Stay: Payer: Managed Care, Other (non HMO) | Attending: Hematology and Oncology | Admitting: Hematology and Oncology

## 2021-12-08 VITALS — BP 137/80 | HR 74 | Temp 98.1°F | Wt 123.5 lb

## 2021-12-08 VITALS — BP 120/69 | HR 67 | Temp 98.0°F | Resp 16

## 2021-12-08 DIAGNOSIS — Z170 Estrogen receptor positive status [ER+]: Secondary | ICD-10-CM

## 2021-12-08 DIAGNOSIS — C50411 Malignant neoplasm of upper-outer quadrant of right female breast: Secondary | ICD-10-CM | POA: Insufficient documentation

## 2021-12-08 DIAGNOSIS — Z5181 Encounter for therapeutic drug level monitoring: Secondary | ICD-10-CM | POA: Diagnosis not present

## 2021-12-08 DIAGNOSIS — Z79811 Long term (current) use of aromatase inhibitors: Secondary | ICD-10-CM | POA: Diagnosis not present

## 2021-12-08 DIAGNOSIS — M810 Age-related osteoporosis without current pathological fracture: Secondary | ICD-10-CM | POA: Insufficient documentation

## 2021-12-08 DIAGNOSIS — Z79899 Other long term (current) drug therapy: Secondary | ICD-10-CM | POA: Diagnosis not present

## 2021-12-08 DIAGNOSIS — Z95828 Presence of other vascular implants and grafts: Secondary | ICD-10-CM

## 2021-12-08 DIAGNOSIS — Z5112 Encounter for antineoplastic immunotherapy: Secondary | ICD-10-CM | POA: Diagnosis present

## 2021-12-08 LAB — CMP (CANCER CENTER ONLY)
ALT: 22 U/L (ref 0–44)
AST: 25 U/L (ref 15–41)
Albumin: 4.6 g/dL (ref 3.5–5.0)
Alkaline Phosphatase: 59 U/L (ref 38–126)
Anion gap: 4 — ABNORMAL LOW (ref 5–15)
BUN: 18 mg/dL (ref 6–20)
CO2: 29 mmol/L (ref 22–32)
Calcium: 9.8 mg/dL (ref 8.9–10.3)
Chloride: 109 mmol/L (ref 98–111)
Creatinine: 0.74 mg/dL (ref 0.44–1.00)
GFR, Estimated: >60 mL/min (ref >60)
Glucose, Bld: 90 mg/dL (ref 70–99)
Potassium: 4.2 mmol/L (ref 3.5–5.1)
Sodium: 142 mmol/L (ref 135–145)
Total Bilirubin: 0.5 mg/dL (ref 0.3–1.2)
Total Protein: 6.7 g/dL (ref 6.5–8.1)

## 2021-12-08 LAB — CBC WITH DIFFERENTIAL (CANCER CENTER ONLY)
Abs Immature Granulocytes: 0.01 10*3/uL (ref 0.00–0.07)
Basophils Absolute: 0 10*3/uL (ref 0.0–0.1)
Basophils Relative: 1 %
Eosinophils Absolute: 0.1 10*3/uL (ref 0.0–0.5)
Eosinophils Relative: 2 %
HCT: 41.1 % (ref 36.0–46.0)
Hemoglobin: 13.9 g/dL (ref 12.0–15.0)
Immature Granulocytes: 0 %
Lymphocytes Relative: 26 %
Lymphs Abs: 1 10*3/uL (ref 0.7–4.0)
MCH: 30 pg (ref 26.0–34.0)
MCHC: 33.8 g/dL (ref 30.0–36.0)
MCV: 88.8 fL (ref 80.0–100.0)
Monocytes Absolute: 0.4 10*3/uL (ref 0.1–1.0)
Monocytes Relative: 10 %
Neutro Abs: 2.2 10*3/uL (ref 1.7–7.7)
Neutrophils Relative %: 61 %
Platelet Count: 246 10*3/uL (ref 150–400)
RBC: 4.63 MIL/uL (ref 3.87–5.11)
RDW: 12.7 % (ref 11.5–15.5)
WBC Count: 3.7 10*3/uL — ABNORMAL LOW (ref 4.0–10.5)
nRBC: 0 % (ref 0.0–0.2)

## 2021-12-08 MED ORDER — SODIUM CHLORIDE 0.9% FLUSH
10.0000 mL | Freq: Once | INTRAVENOUS | Status: AC
  Administered 2021-12-08: 10 mL

## 2021-12-08 MED ORDER — SODIUM CHLORIDE 0.9% FLUSH
10.0000 mL | INTRAVENOUS | Status: DC | PRN
  Administered 2021-12-08: 10 mL

## 2021-12-08 MED ORDER — SODIUM CHLORIDE 0.9 % IV SOLN: Freq: Once | INTRAVENOUS | Status: AC

## 2021-12-08 MED ORDER — DIPHENHYDRAMINE HCL 25 MG PO CAPS
50.0000 mg | ORAL_CAPSULE | Freq: Once | ORAL | Status: AC
  Administered 2021-12-08: 50 mg via ORAL
  Filled 2021-12-08: qty 2

## 2021-12-08 MED ORDER — ACETAMINOPHEN 325 MG PO TABS
650.0000 mg | ORAL_TABLET | Freq: Once | ORAL | Status: AC
  Administered 2021-12-08: 650 mg via ORAL
  Filled 2021-12-08: qty 2

## 2021-12-08 MED ORDER — TRASTUZUMAB-ANNS CHEMO 150 MG IV SOLR
6.0000 mg/kg | Freq: Once | INTRAVENOUS | Status: AC
  Administered 2021-12-08: 336 mg via INTRAVENOUS
  Filled 2021-12-08: qty 16

## 2021-12-08 MED ORDER — HEPARIN SOD (PORK) LOCK FLUSH 100 UNIT/ML IV SOLN
500.0000 [IU] | Freq: Once | INTRAVENOUS | Status: AC | PRN
  Administered 2021-12-08: 500 [IU]

## 2021-12-08 NOTE — Assessment & Plan Note (Addendum)
03/14/2021:Palpable right breast mass diagnostic mammogram: 1.6 cm mass in the right breast at 9 o'clock. 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 04/20/21: Rt Lumpectomy: Grade 3 IDC 0/5 Ln neg, Margins Neg,Her2+, Copy #5.85, ratio 3.16 ER+(60%)/PR+(0%), Ki-67 80%.  Treatment Plan: 1.adjuvant chemotherapy with Taxol Herceptin followed by Herceptin maintenance started 05/18/2021 2.Adjuvant radiation therapy completed 09/13/2021 3.Adjuvant antiestrogen therapy with letrozole started 10/05/2021 Zometa infusion every 6 months x2 years --------------------------------------------------------------------------------------------------------------------------------------------------- Current treatment:Herceptin (until January 2024), letrozole started 10/06/2018 Echocardiogram 07/10/2021: EF 60 to 65% Labs reviewed Toxicities: None  Bladder issues: Referred to urology, seeing Dr. Claudia Desanctis (feels that it is related to prolapse)  Letrozole toxicities: Denies any adverse effects to letrozole Osteoporosis: Patient has a T score of -2.7 in the back and this [facility] and [facility]  Zometa every 6 months for 2 years   We discussed Signatera testing for minimal residual disease after she completes with her treatment.  Patient requested that she only see me for follow-up.  Return to clinic every 3 weeks for Herceptin and every 6 weeks for follow-up with me

## 2021-12-08 NOTE — Patient Instructions (Signed)
[facility]ICAL [facility]COL[facility]   Discharge Instructions: Thank you for choosing [facility] to provide your oncology and hematology care.   If you have a lab appointment with the Cancer Center, please go directly to the Cancer Center and check in at the registration area.   Wear comfortable clothing and clothing appropriate for easy access to any Portacath or PICC line.   We strive to give you quality time with your provider. You may need to reschedule your appointment if you arrive late (15 or more minutes).  Arriving late affects you and other patients whose appointments are after yours.  Also, if you miss three or more appointments without notifying the office, you may be dismissed from the clinic at the provider's discretion.      For prescription refill requests, have your pharmacy contact our office and allow 72 hours for refills to be completed.    Today you received the following chemotherapy and/or immunotherapy agents: Trastuzumab-anns      To help prevent nausea and vomiting after your treatment, we encourage you to take your nausea medication as directed.  BELOW ARE SYMPTOMS THAT SHOULD BE REPORTED IMMEDIATELY: *FEVER GREATER THAN 100.4 F (38 C) OR HIGHER *CHILLS OR SWEATING *NAUSEA AND VOMITING THAT IS NOT CONTROLLED WITH YOUR NAUSEA MEDICATION *UNUSUAL SHORTNESS OF BREATH *UNUSUAL BRUISING OR BLEEDING *URINARY PROBLEMS (pain or burning when urinating, or frequent urination) *BOWEL PROBLEMS (unusual diarrhea, constipation, pain near the anus) TENDERNESS IN MOUTH AND THROAT WITH OR WITHOUT PRESENCE OF ULCERS (sore throat, sores in mouth, or a toothache) UNUSUAL RASH, SWELLING OR PAIN  UNUSUAL VAGINAL DISCHARGE OR ITCHING   Items with * indicate a potential emergency and should be followed up as soon as possible or go to the Emergency Department if any problems should occur.  Please show the CHEMOTHERAPY ALERT CARD or IMMUNOTHERAPY ALERT CARD at  check-in to the Emergency Department and triage nurse.  Should you have questions after your visit or need to cancel or reschedule your appointment, please contact [facility]  Dept: [phone]  and follow the prompts.  Office hours are 8:00 a.m. to 4:30 p.m. Monday - Friday. Please note that voicemails left after 4:00 p.m. may not be returned until the following business day.  We are closed weekends and major holidays. You have access to a nurse at all times for urgent questions. Please call the main number to the clinic Dept: [phone] and follow the prompts.   For any non-urgent questions, you may also contact your provider using MyChart. We now offer e-Visits for anyone 18 and older to request care online for non-urgent symptoms. For details visit [email or web address].   Also download the MyChart app! Go to the app store, search "MyChart", open the app, select [facility], and log in with your MyChart username and password.  Masks are optional in the cancer centers. If you would like for your care team to wear a mask while they are taking care of you, please let them know. For doctor visits, patients may have with them one support person who is at least 58 years old. At this time, visitors are not allowed in the infusion area. 

## 2021-12-12 ENCOUNTER — Telehealth: Payer: Self-pay | Admitting: Hematology and Oncology

## 2021-12-12 NOTE — Telephone Encounter (Signed)
Scheduled appointment per 8/18 los. Left voicemail.

## 2021-12-13 ENCOUNTER — Other Ambulatory Visit: Payer: Self-pay

## 2021-12-13 ENCOUNTER — Encounter: Payer: Managed Care, Other (non HMO) | Admitting: Physical Therapy

## 2021-12-15 ENCOUNTER — Other Ambulatory Visit: Payer: Self-pay

## 2021-12-21 ENCOUNTER — Other Ambulatory Visit: Payer: Self-pay

## 2021-12-23 ENCOUNTER — Other Ambulatory Visit: Payer: Self-pay

## 2021-12-24 ENCOUNTER — Other Ambulatory Visit: Payer: Self-pay

## 2021-12-26 ENCOUNTER — Other Ambulatory Visit: Payer: Self-pay | Admitting: Hematology and Oncology

## 2021-12-26 DIAGNOSIS — Z170 Estrogen receptor positive status [ER+]: Secondary | ICD-10-CM

## 2021-12-27 NOTE — Progress Notes (Signed)
The following biosimilar Kanjinti (trastuzumab-anns) has been selected for use in this patient.

## 2021-12-29 ENCOUNTER — Inpatient Hospital Stay: Payer: Managed Care, Other (non HMO) | Attending: Hematology and Oncology

## 2021-12-29 ENCOUNTER — Other Ambulatory Visit: Payer: Self-pay

## 2021-12-29 VITALS — BP 120/70 | HR 65 | Temp 98.0°F | Resp 16 | Wt 123.0 lb

## 2021-12-29 DIAGNOSIS — M810 Age-related osteoporosis without current pathological fracture: Secondary | ICD-10-CM | POA: Diagnosis not present

## 2021-12-29 DIAGNOSIS — Z79811 Long term (current) use of aromatase inhibitors: Secondary | ICD-10-CM | POA: Insufficient documentation

## 2021-12-29 DIAGNOSIS — C50411 Malignant neoplasm of upper-outer quadrant of right female breast: Secondary | ICD-10-CM | POA: Insufficient documentation

## 2021-12-29 DIAGNOSIS — Z170 Estrogen receptor positive status [ER+]: Secondary | ICD-10-CM | POA: Insufficient documentation

## 2021-12-29 DIAGNOSIS — Z5112 Encounter for antineoplastic immunotherapy: Secondary | ICD-10-CM | POA: Insufficient documentation

## 2021-12-29 MED ORDER — HEPARIN SOD (PORK) LOCK FLUSH 100 UNIT/ML IV SOLN
500.0000 [IU] | Freq: Once | INTRAVENOUS | Status: AC | PRN
  Administered 2021-12-29: 500 [IU]

## 2021-12-29 MED ORDER — SODIUM CHLORIDE 0.9 % IV SOLN: Freq: Once | INTRAVENOUS | Status: AC

## 2021-12-29 MED ORDER — TRASTUZUMAB-ANNS CHEMO 150 MG IV SOLR
6.0000 mg/kg | Freq: Once | INTRAVENOUS | Status: AC
  Administered 2021-12-29: 336 mg via INTRAVENOUS
  Filled 2021-12-29: qty 16

## 2021-12-29 MED ORDER — SODIUM CHLORIDE 0.9% FLUSH
10.0000 mL | INTRAVENOUS | Status: DC | PRN
  Administered 2021-12-29: 10 mL

## 2021-12-29 MED ORDER — DIPHENHYDRAMINE HCL 25 MG PO CAPS
50.0000 mg | ORAL_CAPSULE | Freq: Once | ORAL | Status: AC
  Administered 2021-12-29: 50 mg via ORAL
  Filled 2021-12-29: qty 2

## 2021-12-29 NOTE — Progress Notes (Signed)
Discussed with patient tylenol removed from this treatment plan. She would rather take this medication at home and see how she does. She does want to take here and will discuss with Dr. Lindi Adie if this needs to be added back next cycle.

## 2021-12-29 NOTE — Patient Instructions (Signed)
[facility]COL[facility]   Discharge Instructions: Thank you for choosing [facility] to provide your oncology and hematology care.   If you have a lab appointment with the Cancer Center, please go directly to the Cancer Center and check in at the registration area.   Wear comfortable clothing and clothing appropriate for easy access to any Portacath or PICC line.   We strive to give you quality time with your provider. You may need to reschedule your appointment if you arrive late (15 or more minutes).  Arriving late affects you and other patients whose appointments are after yours.  Also, if you miss three or more appointments without notifying the office, you may be dismissed from the clinic at the provider's discretion.      For prescription refill requests, have your pharmacy contact our office and allow 72 hours for refills to be completed.    Today you received the following chemotherapy and/or immunotherapy agents: Trastuzumab-anns      To help prevent nausea and vomiting after your treatment, we encourage you to take your nausea medication as directed.  BELOW ARE SYMPTOMS THAT SHOULD BE REPORTED IMMEDIATELY: *FEVER GREATER THAN 100.4 F (38 C) OR HIGHER *CHILLS OR SWEATING *NAUSEA AND VOMITING THAT IS NOT CONTROLLED WITH YOUR NAUSEA MEDICATION *UNUSUAL SHORTNESS OF BREATH *UNUSUAL BRUISING OR BLEEDING *URINARY PROBLEMS (pain or burning when urinating, or frequent urination) *BOWEL PROBLEMS (unusual diarrhea, constipation, pain near the anus) TENDERNESS IN MOUTH AND THROAT WITH OR WITHOUT PRESENCE OF ULCERS (sore throat, sores in mouth, or a toothache) UNUSUAL RASH, SWELLING OR PAIN  UNUSUAL VAGINAL DISCHARGE OR ITCHING   Items with * indicate a potential emergency and should be followed up as soon as possible or go to the Emergency Department if any problems should occur.  Please show the CHEMOTHERAPY ALERT CARD or IMMUNOTHERAPY ALERT CARD at  check-in to the Emergency Department and triage nurse.  Should you have questions after your visit or need to cancel or reschedule your appointment, please contact [facility]  Dept: [phone]  and follow the prompts.  Office hours are 8:00 a.m. to 4:30 p.m. Monday - Friday. Please note that voicemails left after 4:00 p.m. may not be returned until the following business day.  We are closed weekends and major holidays. You have access to a nurse at all times for urgent questions. Please call the main number to the clinic Dept: [phone] and follow the prompts.   For any non-urgent questions, you may also contact your provider using MyChart. We now offer e-Visits for anyone 18 and older to request care online for non-urgent symptoms. For details visit [email or web address].   Also download the MyChart app! Go to the app store, search "MyChart", open the app, select , and log in with your MyChart username and password.  Masks are optional in the cancer centers. If you would like for your care team to wear a mask while they are taking care of you, please let them know. For doctor visits, patients may have with them one support person who is at least 58 years old. At this time, visitors are not allowed in the infusion area. 

## 2022-01-08 ENCOUNTER — Ambulatory Visit (HOSPITAL_COMMUNITY)
Admission: RE | Admit: 2022-01-08 | Discharge: 2022-01-08 | Disposition: A | Discharge status: Home / Self Care | Payer: Managed Care, Other (non HMO) | Source: Ambulatory Visit | Attending: Hematology and Oncology | Admitting: Hematology and Oncology

## 2022-01-08 DIAGNOSIS — Z79899 Other long term (current) drug therapy: Secondary | ICD-10-CM | POA: Diagnosis not present

## 2022-01-08 DIAGNOSIS — Z0189 Encounter for other specified special examinations: Secondary | ICD-10-CM

## 2022-01-08 DIAGNOSIS — Z5181 Encounter for therapeutic drug level monitoring: Secondary | ICD-10-CM | POA: Diagnosis not present

## 2022-01-08 DIAGNOSIS — Z01810 Encounter for preprocedural cardiovascular examination: Secondary | ICD-10-CM | POA: Diagnosis not present

## 2022-01-08 DIAGNOSIS — Z5111 Encounter for antineoplastic chemotherapy: Secondary | ICD-10-CM

## 2022-01-08 LAB — ECHOCARDIOGRAM COMPLETE
AR max vel: 2.36 cm2
AV Peak grad: 5 mmHg
Ao pk vel: 1.12 m/s
Area-P 1/2: 5.75 cm2
S' Lateral: 3.4 cm

## 2022-01-13 ENCOUNTER — Other Ambulatory Visit: Payer: Self-pay

## 2022-01-15 ENCOUNTER — Ambulatory Visit: Payer: Managed Care, Other (non HMO) | Attending: General Surgery

## 2022-01-15 VITALS — Wt 123.0 lb

## 2022-01-15 DIAGNOSIS — Z483 Aftercare following surgery for neoplasm: Secondary | ICD-10-CM | POA: Insufficient documentation

## 2022-01-15 NOTE — Progress Notes (Signed)
Patient Care Team: Lesleigh Noe, MD as PCP - General (Family Medicine) Druscilla Brownie, MD as Consulting Physician (Dermatology) Stark Klein, MD as Consulting Physician (General Surgery) [name], MD as Consulting Physician (Hematology and Oncology) Gery Pray, MD as Consulting Physician (Radiation Oncology)  DIAGNOSIS:  Encounter Diagnosis  Name Primary?   Malignant neoplasm of upper-outer quadrant of right breast in female, estrogen receptor positive ([place])     SUMMARY OF ONCOLOGIC HISTORY: Oncology History  Malignant neoplasm of upper-outer quadrant of right breast in female, estrogen receptor positive ([place])  03/14/2021 Initial Diagnosis   Palpable right breast mass diagnostic mammogram: 1.6 cm mass in the right breast at 9 o'clock. 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    03/22/2021 Cancer Staging   Staging form: Breast, AJCC 8th Edition - Clinical stage from 03/22/2021: Stage IA (cT1b, cN0, cM0, G3, ER+, PR-, HER2+) - Signed by [name], MD on 03/22/2021 Stage prefix: Initial diagnosis Histologic grading system: 3 grade system    Genetic Testing   [facility] CustomNext Panel was Negative. Report date is 04/03/2021.  The CustomNext-Cancer+RNAinsight panel offered by Althia Forts includes sequencing and rearrangement analysis for the following 47 genes:  APC, ATM, AXIN2, BARD1, BMPR1A, BRCA1, BRCA2, BRIP1, CDH1, CDK4, CDKN2A, CHEK2, CTNNA1, DICER1, EPCAM, GREM1, HOXB13, KIT, MEN1, MLH1, MSH2, MSH3, MSH6, MUTYH, NBN, NF1, NTHL1, PALB2, PDGFRA, PMS2, POLD1, POLE, PTEN, RAD50, RAD51C, RAD51D, SDHA, SDHB, SDHC, SDHD, SMAD4, SMARCA4, STK11, TP53, TSC1, TSC2, and VHL.  RNA data is routinely analyzed for use in variant interpretation for all genes.   04/20/2021 Surgery   Rt Lumpectomy: Grade 3 IDC 1.9 cm 0/5 Ln neg, Margins Neg, Her2+, Copy #5.85, ratio 3.16 ER+(60%)/PR+(0%), Ki-67 80%   04/20/2021  Cancer Staging   Staging form: Breast, AJCC 8th Edition - Pathologic stage from 04/20/2021: Stage IA (pT1c, pN0, cM0, G3, ER+, PR-, HER2+) - Signed by [name], NP on 06/30/2021 Stage prefix: Initial diagnosis Histologic grading system: 3 grade system   05/18/2021 - 12/08/2021 Chemotherapy   Patient is on Treatment Plan : BREAST Paclitaxel + Trastuzumab q7d / Trastuzumab q21d     05/18/2021 -  Chemotherapy   Patient is on Treatment Plan : BREAST Paclitaxel + Trastuzumab q7d / Trastuzumab q21d     08/16/2021 - 09/13/2021 Radiation Therapy   Site Technique Total Dose (Gy) Dose per Fx (Gy) Completed Fx Beam Energies  Breast, Right: Breast_R 3D 40.05/40.05 2.67 15/15 10X  Breast, Right: Breast_R_Bst 3D 12/12 2 6/6 6X, 10X     10/05/2021 -  Anti-estrogen oral therapy   Letrozole x 5-7 years     CHIEF COMPLIANT: Follow-up on Herceptin on Letrozole  INTERVAL HISTORY: Susan Reid is a 56 y.o. with above-mentioned history of  breast cancer, currently on  Herceptin and Letrozole. She presents to the clinic today for a follow-up. She reports that she is having some taste changing. She can only tasting citrus lemon and spicy things. She denies any other symptoms. She is tolerating the letrozole extremely well. She states that hip pain better. She has been stretching and walking more.   ALLERGIES:  is allergic to nitrates, organic.  MEDICATIONS:  Current Outpatient Medications  Medication Sig Dispense Refill   acetaminophen (TYLENOL) 325 MG tablet Take 1,000 mg by mouth every 6 (six) hours as needed.     Ascorbic Acid (VITAMIN C PO) Take 500 mg by mouth daily.     Calcium Citrate 1040 MG TABS  Take 2,080 mg of elemental calcium by mouth daily. (Patient not taking: Reported on 10/27/2021)     cholecalciferol (VITAMIN D) 1000 UNITS tablet Take 1,000 Units by mouth daily.     fluticasone (FLONASE) 50 MCG/ACT nasal spray Place 1 spray into both nostrils daily as needed for allergies or  rhinitis.     letrozole (FEMARA) 2.5 MG tablet Take 1 tablet (2.5 mg total) by mouth daily. 90 tablet 3   loratadine (CLARITIN) 10 MG tablet Take 10 mg by mouth daily as needed for allergies.     Multiple Vitamin (MULTIVITAMIN) tablet Take 1 tablet by mouth daily.     No current facility-administered medications for this visit.    PHYSICAL EXAMINATION: ECOG PERFORMANCE STATUS: 1 - Symptomatic but completely ambulatory  Vitals:   01/19/22 0839  BP: 137/77  Pulse: 76  Resp: 14  Temp: (!) 97.3 F (36.3 C)  SpO2: 99%   Filed Weights   01/19/22 0839  Weight: 122 lb 11.2 oz (55.7 kg)      LABORATORY DATA:  I have reviewed the data as listed    Latest Ref Rng & Units 01/19/2022    8:26 AM 12/08/2021    9:56 AM 10/27/2021    9:30 AM  CMP  Glucose 70 - 99 mg/dL 78  90  97   BUN 6 - 20 mg/dL _0 Creatinine 0.44 - 1.00 mg/dL 0.74  0.74  0.73   Sodium 135 - 145 mmol/L 142  142  141   Potassium 3.5 - 5.1 mmol/L 4.3  4.2  4.1   Chloride 98 - 111 mmol/L 106  109  108   CO2 22 - 32 mmol/L _1 Calcium 8.9 - 10.3 mg/dL 9.8  9.8  9.4   Total Protein 6.5 - 8.1 g/dL 7.1  6.7  7.0   Total Bilirubin 0.3 - 1.2 mg/dL 0.6  0.5  0.4   Alkaline Phos 38 - 126 U/L 62  59  79   AST 15 - 41 U/L _2 ALT 0 - 44 U/L _3 Lab Results  Component Value Date   WBC 3.7 (L) 01/19/2022   HGB 13.1 01/19/2022   HCT 39.5 01/19/2022   MCV 90.4 01/19/2022   PLT 243 01/19/2022   NEUTROABS 2.1 01/19/2022    ASSESSMENT & PLAN:  Malignant neoplasm of upper-outer quadrant of right breast in female, estrogen receptor positive ([place]) 03/14/2021:Palpable right breast mass diagnostic mammogram: 1.6 cm mass in the right breast at 9 o'clock. 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  04/20/21: Rt Lumpectomy: Grade 3 IDC 0/5 Ln neg, Margins Neg, Her2+, Copy #5.85, ratio 3.16 ER+(60%)/PR+(0%), Ki-67 80%.   Treatment  Plan: 1. adjuvant chemotherapy with Taxol Herceptin followed by Herceptin maintenance started 05/18/2021 2.  Adjuvant radiation therapy completed 09/13/2021 3.  Adjuvant antiestrogen therapy with letrozole started 10/05/2021 Zometa infusion every 6 months x2 years --------------------------------------------------------------------------------------------------------------------------------------------------- Current treatment: Herceptin (until January 2024), letrozole started 10/06/2018 Echocardiogram 01/08/2022: EF 55-60% Labs reviewed Toxicities: None   Bladder issues: Referred to urology, seeing Dr. Claudia Desanctis (feels that it is related to prolapse)   Letrozole toxicities: Denies any adverse effects to letrozole Osteoporosis: Patient has a T score of -2.7 in the back and [facility]  Zometa every 6 months for 2 years.  Next Zometa will be December 2023.   We discussed Signatera testing for minimal residual disease after she completes with her treatment.   Patient requested that she only see me for follow-up. Mammograms have been ordered for November   return to clinic every 3 weeks for Herceptin and every 6 weeks for follow-up with me      No orders of the defined types were placed in this encounter.  The patient has a good understanding of the overall plan. she agrees with it. she will call with any problems that may develop before the next visit here. Total time spent: 30 mins including face to face time and time spent for planning, charting and co-ordination of care   Harriette Ohara, MD 01/19/22    I Gardiner Coins am scribing for Dr. Lindi Adie  I have reviewed the above documentation for accuracy and completeness, and I agree with the above.

## 2022-01-15 NOTE — Therapy (Signed)
  OUTPATIENT PHYSICAL THERAPY SOZO SCREENING NOTE   Patient Name: Susan Reid MRN: [id number] DOB:December 09, 1965, 56 y.o., female Today's Date: 01/15/2022  PCP: Lesleigh Noe, MD REFERRING PROVIDER: Stark Klein, MD   PT End of Session - 01/15/22 [phone]     Visit Number 11   # unchanged due to screen only   PT Start Time 3716    PT Stop Time 9678    PT Time Calculation (min) 6 min    Activity Tolerance Patient tolerated treatment well    Behavior During Therapy [facility] for tasks assessed/performed             Past Medical History:  Diagnosis Date   Allergy    Family history of adverse reaction to anesthesia    Mother got severe N/V and pneumonia following anesthesia   History of radiation therapy    Right breast- 08/16/21-09/13/21- Dr. Gery Pray   Osteopenia    Past Surgical History:  Procedure Laterality Date   AXILLARY SENTINEL NODE BIOPSY Right 04/20/2021   Procedure: RIGHT AXILLARY SENTINEL NODE BIOPSY;  Surgeon: Stark Klein, MD;  Location: [place];  Service: General;  Laterality: Right;   BREAST BIOPSY Right 02/25/2020   [place]   BREAST BIOPSY Right 03/14/2021   BREAST LUMPECTOMY WITH SENTINEL LYMPH NODE BIOPSY Right 04/20/2021   Procedure: RIGHT BREAST LUMPECTOMY;  Surgeon: Stark Klein, MD;  Location: [place];  Service: General;  Laterality: Right;   PORTACATH PLACEMENT Left 04/20/2021   Procedure: INSERTION PORT-A-CATH;  Surgeon: Stark Klein, MD;  Location: [place];  Service: General;  Laterality: Left;   [place] EXTRACTION  04/24/1987   Patient Active Problem List   Diagnosis Date Noted   Port-A-Cath in place 05/18/2021   Genetic testing 04/03/2021   Family history of breast cancer 03/23/2021   Malignant neoplasm of upper-outer quadrant of right breast in female, estrogen receptor positive ([place]) 03/20/2021   Osteoporosis 12/10/2018    REFERRING DIAG: right breast cancer at risk for lymphedema  THERAPY DIAG:  Aftercare following surgery for  neoplasm  PERTINENT HISTORY: Patient was diagnosed on 03/07/2021 with 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 She had a right lumpectomy and senitnel node biopsy (5 negative nodes) on 04/20/2021. It is ER positive, PR negative, and HER2 positive with a Ki67 of 80%.   PRECAUTIONS: right UE Lymphedema risk, None  SUBJECTIVE: Pt returns for her 3 month L-Dex screen.   PAIN:  Are you having pain? No  SOZO SCREENING: Patient was assessed today using the SOZO machine to determine the lymphedema index score. This was compared to her baseline score. It was determined that she is within the recommended range when compared to her baseline and no further action is needed at this time. She will continue SOZO screenings. These are done every 3 months for 2 years post operatively followed by every 6 months for 2 years, and then annually.   L-DEX FLOWSHEETS - 01/15/22 0900       L-DEX LYMPHEDEMA SCREENING   Measurement Type Unilateral    L-DEX MEASUREMENT EXTREMITY Upper Extremity    POSITION  Standing    DOMINANT SIDE Right    At Risk Side Right    BASELINE SCORE (UNILATERAL) 2.8    L-DEX SCORE (UNILATERAL) -1.2    VALUE CHANGE (UNILAT) -4               Otelia Limes, PTA 01/15/2022, 9:55 AM

## 2022-01-19 ENCOUNTER — Inpatient Hospital Stay: Payer: Managed Care, Other (non HMO)

## 2022-01-19 ENCOUNTER — Inpatient Hospital Stay (HOSPITAL_BASED_OUTPATIENT_CLINIC_OR_DEPARTMENT_OTHER): Payer: Managed Care, Other (non HMO) | Admitting: Hematology and Oncology

## 2022-01-19 DIAGNOSIS — Z5112 Encounter for antineoplastic immunotherapy: Secondary | ICD-10-CM | POA: Diagnosis not present

## 2022-01-19 DIAGNOSIS — Z95828 Presence of other vascular implants and grafts: Secondary | ICD-10-CM

## 2022-01-19 DIAGNOSIS — Z170 Estrogen receptor positive status [ER+]: Secondary | ICD-10-CM

## 2022-01-19 DIAGNOSIS — C50411 Malignant neoplasm of upper-outer quadrant of right female breast: Secondary | ICD-10-CM | POA: Diagnosis not present

## 2022-01-19 LAB — CBC WITH DIFFERENTIAL (CANCER CENTER ONLY)
Abs Immature Granulocytes: 0.01 10*3/uL (ref 0.00–0.07)
Basophils Absolute: 0 10*3/uL (ref 0.0–0.1)
Basophils Relative: 1 %
Eosinophils Absolute: 0.1 10*3/uL (ref 0.0–0.5)
Eosinophils Relative: 4 %
HCT: 39.5 % (ref 36.0–46.0)
Hemoglobin: 13.1 g/dL (ref 12.0–15.0)
Immature Granulocytes: 0 %
Lymphocytes Relative: 29 %
Lymphs Abs: 1.1 10*3/uL (ref 0.7–4.0)
MCH: 30 pg (ref 26.0–34.0)
MCHC: 33.2 g/dL (ref 30.0–36.0)
MCV: 90.4 fL (ref 80.0–100.0)
Monocytes Absolute: 0.4 10*3/uL (ref 0.1–1.0)
Monocytes Relative: 10 %
Neutro Abs: 2.1 10*3/uL (ref 1.7–7.7)
Neutrophils Relative %: 56 %
Platelet Count: 243 10*3/uL (ref 150–400)
RBC: 4.37 MIL/uL (ref 3.87–5.11)
RDW: 13.3 % (ref 11.5–15.5)
WBC Count: 3.7 10*3/uL — ABNORMAL LOW (ref 4.0–10.5)
nRBC: 0 % (ref 0.0–0.2)

## 2022-01-19 LAB — CMP (CANCER CENTER ONLY)
ALT: 16 U/L (ref 0–44)
AST: 22 U/L (ref 15–41)
Albumin: 4.5 g/dL (ref 3.5–5.0)
Alkaline Phosphatase: 62 U/L (ref 38–126)
Anion gap: 6 (ref 5–15)
BUN: 19 mg/dL (ref 6–20)
CO2: 30 mmol/L (ref 22–32)
Calcium: 9.8 mg/dL (ref 8.9–10.3)
Chloride: 106 mmol/L (ref 98–111)
Creatinine: 0.74 mg/dL (ref 0.44–1.00)
GFR, Estimated: >60 mL/min (ref >60)
Glucose, Bld: 78 mg/dL (ref 70–99)
Potassium: 4.3 mmol/L (ref 3.5–5.1)
Sodium: 142 mmol/L (ref 135–145)
Total Bilirubin: 0.6 mg/dL (ref 0.3–1.2)
Total Protein: 7.1 g/dL (ref 6.5–8.1)

## 2022-01-19 MED ORDER — TRASTUZUMAB-ANNS CHEMO 150 MG IV SOLR
6.0000 mg/kg | Freq: Once | INTRAVENOUS | Status: AC
  Administered 2022-01-19: 336 mg via INTRAVENOUS
  Filled 2022-01-19: qty 16

## 2022-01-19 MED ORDER — HEPARIN SOD (PORK) LOCK FLUSH 100 UNIT/ML IV SOLN
500.0000 [IU] | Freq: Once | INTRAVENOUS | Status: AC | PRN
  Administered 2022-01-19: 500 [IU]

## 2022-01-19 MED ORDER — SODIUM CHLORIDE 0.9% FLUSH
10.0000 mL | INTRAVENOUS | Status: DC | PRN
  Administered 2022-01-19: 10 mL

## 2022-01-19 MED ORDER — SODIUM CHLORIDE 0.9 % IV SOLN: Freq: Once | INTRAVENOUS | Status: AC

## 2022-01-19 MED ORDER — ACETAMINOPHEN 325 MG PO TABS
650.0000 mg | ORAL_TABLET | Freq: Once | ORAL | Status: AC
  Administered 2022-01-19: 650 mg via ORAL
  Filled 2022-01-19: qty 2

## 2022-01-19 MED ORDER — DIPHENHYDRAMINE HCL 25 MG PO CAPS
50.0000 mg | ORAL_CAPSULE | Freq: Once | ORAL | Status: AC
  Administered 2022-01-19: 50 mg via ORAL
  Filled 2022-01-19: qty 2

## 2022-01-19 MED ORDER — SODIUM CHLORIDE 0.9% FLUSH
10.0000 mL | Freq: Once | INTRAVENOUS | Status: AC
  Administered 2022-01-19: 10 mL

## 2022-01-19 NOTE — Assessment & Plan Note (Addendum)
03/14/2021:Palpable right breast mass diagnostic mammogram: 1.6 cm mass in the right breast at 9 o'clock. 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 04/20/21: Rt Lumpectomy: Grade 3 IDC 0/5 Ln neg, Margins Neg,Her2+, Copy #5.85, ratio 3.16 ER+(60%)/PR+(0%), Ki-67 80%.  Treatment Plan: 1.adjuvant chemotherapy with Taxol Herceptin followed by Herceptin maintenance started 05/18/2021 2.Adjuvant radiation therapycompleted 09/13/2021 3.Adjuvant antiestrogen therapywith letrozole started 10/05/2021 Zometa infusion every 6 months x2 years --------------------------------------------------------------------------------------------------------------------------------------------------- Current treatment:Herceptin(until January 2024), letrozole started 10/06/2018 Echocardiogram 01/08/2022: EF 55-60% Labs reviewed Toxicities: None  Bladder issues: Referred to urology, seeing Dr. Claudia Desanctis (feels that it is related to prolapse)  Letrozole toxicities: Denies any adverse effects to letrozole Osteoporosis: Patient has a T score of -2.7 in the back and [facility]  Zometa every 6 months for 2 years   We discussed Signatera testing for minimal residual disease after she completes with her treatment.  Patient requested that she only see me for follow-up.  Return to clinic every 3 weeks for Herceptin and every 6 weeks for follow-up with me

## 2022-01-19 NOTE — Patient Instructions (Signed)
[facility] MEDICAL ONCOLOGY   Discharge Instructions: Thank you for choosing [facility] to provide your oncology and hematology care.   If you have a lab appointment with the Cancer Center, please go directly to the Cancer Center and check in at the registration area.   Wear comfortable clothing and clothing appropriate for easy access to any Portacath or PICC line.   We strive to give you quality time with your provider. You may need to reschedule your appointment if you arrive late (15 or more minutes).  Arriving late affects you and other patients whose appointments are after yours.  Also, if you miss three or more appointments without notifying the office, you may be dismissed from the clinic at the provider's discretion.      For prescription refill requests, have your pharmacy contact our office and allow 72 hours for refills to be completed.    Today you received the following chemotherapy and/or immunotherapy agents: Trastuzumab-anns      To help prevent nausea and vomiting after your treatment, we encourage you to take your nausea medication as directed.  BELOW ARE SYMPTOMS THAT SHOULD BE REPORTED IMMEDIATELY: *FEVER GREATER THAN 100.4 F (38 C) OR HIGHER *CHILLS OR SWEATING *NAUSEA AND VOMITING THAT IS NOT CONTROLLED WITH YOUR NAUSEA MEDICATION *UNUSUAL SHORTNESS OF BREATH *UNUSUAL BRUISING OR BLEEDING *URINARY PROBLEMS (pain or burning when urinating, or frequent urination) *BOWEL PROBLEMS (unusual diarrhea, constipation, pain near the anus) TENDERNESS IN MOUTH AND THROAT WITH OR WITHOUT PRESENCE OF ULCERS (sore throat, sores in mouth, or a toothache) UNUSUAL RASH, SWELLING OR PAIN  UNUSUAL VAGINAL DISCHARGE OR ITCHING   Items with * indicate a potential emergency and should be followed up as soon as possible or go to the Emergency Department if any problems should occur.  Please show the CHEMOTHERAPY ALERT CARD or IMMUNOTHERAPY ALERT CARD at  check-in to the Emergency Department and triage nurse.  Should you have questions after your visit or need to cancel or reschedule your appointment, please contact [facility] MED[facility] ONCOL[facility]  Dept: [phone]  and follow the prompts.  Office hours are 8:00 a.m. to 4:30 p.m. Monday - Friday. Please note that voicemails left after 4:00 p.m. may not be returned until the following business day.  We are closed weekends and major holidays. You have access to a nurse at all times for urgent questions. Please call the main number to the clinic Dept: [phone] and follow the prompts.   For any non-urgent questions, you may also contact your provider using MyChart. We now offer e-Visits for anyone 18 and older to request care online for non-urgent symptoms. For details visit [email or web address].   Also download the MyChart app! Go to the app store, search "MyChart", open the app, select , and log in with your MyChart username and password.  Masks are optional in the cancer centers. If you would like for your care team to wear a mask while they are taking care of you, please let them know. For doctor visits, patients may have with them one support Susan Reid who is at least 58 years old. At this time, visitors are not allowed in the infusion area. 

## 2022-01-22 ENCOUNTER — Encounter: Payer: Self-pay | Admitting: *Deleted

## 2022-02-02 ENCOUNTER — Encounter: Payer: Self-pay | Admitting: Hematology and Oncology

## 2022-02-09 ENCOUNTER — Inpatient Hospital Stay: Payer: Managed Care, Other (non HMO) | Attending: Hematology and Oncology

## 2022-02-09 VITALS — BP 100/76 | HR 72 | Temp 97.9°F | Resp 17 | Wt 123.8 lb

## 2022-02-09 DIAGNOSIS — Z170 Estrogen receptor positive status [ER+]: Secondary | ICD-10-CM

## 2022-02-09 DIAGNOSIS — Z5112 Encounter for antineoplastic immunotherapy: Secondary | ICD-10-CM | POA: Insufficient documentation

## 2022-02-09 DIAGNOSIS — C50411 Malignant neoplasm of upper-outer quadrant of right female breast: Secondary | ICD-10-CM | POA: Diagnosis present

## 2022-02-09 MED ORDER — ACETAMINOPHEN 325 MG PO TABS
650.0000 mg | ORAL_TABLET | Freq: Once | ORAL | Status: AC
  Administered 2022-02-09: 650 mg via ORAL
  Filled 2022-02-09: qty 2

## 2022-02-09 MED ORDER — SODIUM CHLORIDE 0.9% FLUSH
10.0000 mL | INTRAVENOUS | Status: DC | PRN
  Administered 2022-02-09: 10 mL

## 2022-02-09 MED ORDER — DIPHENHYDRAMINE HCL 25 MG PO CAPS
50.0000 mg | ORAL_CAPSULE | Freq: Once | ORAL | Status: AC
  Administered 2022-02-09: 50 mg via ORAL
  Filled 2022-02-09: qty 2

## 2022-02-09 MED ORDER — HEPARIN SOD (PORK) LOCK FLUSH 100 UNIT/ML IV SOLN
500.0000 [IU] | Freq: Once | INTRAVENOUS | Status: AC | PRN
  Administered 2022-02-09: 500 [IU]

## 2022-02-09 MED ORDER — TRASTUZUMAB-ANNS CHEMO 150 MG IV SOLR
6.0000 mg/kg | Freq: Once | INTRAVENOUS | Status: AC
  Administered 2022-02-09: 336 mg via INTRAVENOUS
  Filled 2022-02-09: qty 16

## 2022-02-09 MED ORDER — SODIUM CHLORIDE 0.9 % IV SOLN: Freq: Once | INTRAVENOUS | Status: AC

## 2022-02-13 ENCOUNTER — Telehealth: Payer: Self-pay | Admitting: *Deleted

## 2022-02-13 NOTE — Telephone Encounter (Signed)
RN received message from [facility] team regarding Echo from 01/08/22 not being medically necessary with insurance.  PA team states "The patient accounting office resubmitted the claim today. They have deemed it medically necessary".  Pt notified that the bill will be resubmitted to insurance for coverage and verbalized understanding.

## 2022-03-01 NOTE — Progress Notes (Signed)
Patient Care Team: Waunita Schooner, MD as PCP - General (Family Medicine) Druscilla Brownie, MD as Consulting Physician (Dermatology) Stark Klein, MD as Consulting Physician (General Surgery) [name], MD as Consulting Physician (Hematology and Oncology) Gery Pray, MD as Consulting Physician (Radiation Oncology)  DIAGNOSIS:  Encounter Diagnosis  Name Primary?   Malignant neoplasm of upper-outer quadrant of right breast in female, estrogen receptor positive ([place]) Yes    SUMMARY OF ONCOLOGIC HISTORY: Oncology History  Malignant neoplasm of upper-outer quadrant of right breast in female, estrogen receptor positive ([place])  03/14/2021 Initial Diagnosis   Palpable right breast mass diagnostic mammogram: 1.6 cm mass in the right breast at 9 o'clock. 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    03/22/2021 Cancer Staging   Staging form: Breast, AJCC 8th Edition - Clinical stage from 03/22/2021: Stage IA (cT1b, cN0, cM0, G3, ER+, PR-, HER2+) - Signed by [name], MD on 03/22/2021 Stage prefix: Initial diagnosis Histologic grading system: 3 grade system    Genetic Testing   [facility] CustomNext Panel was Negative. Report date is 04/03/2021.  The CustomNext-Cancer+RNAinsight panel offered by [facility] includes sequencing and rearrangement analysis for the following 47 genes:  APC, ATM, AXIN2, BARD1, BMPR1A, BRCA1, BRCA2, BRIP1, CDH1, CDK4, CDKN2A, CHEK2, CTNNA1, DICER1, EPCAM, GREM1, HOXB13, KIT, MEN1, MLH1, MSH2, MSH3, MSH6, MUTYH, NBN, NF1, NTHL1, PALB2, PDGFRA, PMS2, POLD1, POLE, PTEN, RAD50, RAD51C, RAD51D, SDHA, SDHB, SDHC, SDHD, SMAD4, SMARCA4, STK11, TP53, TSC1, TSC2, and VHL.  RNA data is routinely analyzed for use in variant interpretation for all genes.   04/20/2021 Surgery   Rt Lumpectomy: Grade 3 IDC 1.9 cm 0/5 Ln neg, Margins Neg, Her2+, Copy #5.85, ratio 3.16 ER+(60%)/PR+(0%), Ki-67 80%   04/20/2021  Cancer Staging   Staging form: Breast, AJCC 8th Edition - Pathologic stage from 04/20/2021: Stage IA (pT1c, pN0, cM0, G3, ER+, PR-, HER2+) - Signed by [name], NP on 06/30/2021 Stage prefix: Initial diagnosis Histologic grading system: 3 grade system   05/18/2021 - 12/08/2021 Chemotherapy   Patient is on Treatment Plan : BREAST Paclitaxel + Trastuzumab q7d / Trastuzumab q21d     05/18/2021 -  Chemotherapy   Patient is on Treatment Plan : BREAST Paclitaxel + Trastuzumab q7d / Trastuzumab q21d     08/16/2021 - 09/13/2021 Radiation Therapy   Site Technique Total Dose (Gy) Dose per Fx (Gy) Completed Fx Beam Energies  Breast, Right: Breast_R 3D 40.05/40.05 2.67 15/15 10X  Breast, Right: Breast_R_Bst 3D 12/12 2 6/6 6X, 10X     10/05/2021 -  Anti-estrogen oral therapy   Letrozole x 5-7 years     CHIEF COMPLIANT: Follow-up right breast on Herceptin on Letrozole   INTERVAL HISTORY: Susan Reid is a 56 y.o with the above-mentioned right breast cancer currently on herceptin and letrozole. She presents to the clinic for a follow-up. She denies any side effects from the herceptin. She denies hot flashes but have some body aches. She states that her taste is off.    ALLERGIES:  is allergic to nitrates, organic.  MEDICATIONS:  Current Outpatient Medications  Medication Sig Dispense Refill   acetaminophen (TYLENOL) 325 MG tablet Take 1,000 mg by mouth every 6 (six) hours as needed.     Ascorbic Acid (VITAMIN C PO) Take 500 mg by mouth daily.     Calcium Citrate 1040 MG TABS Take 2,080 mg of elemental calcium by mouth daily.     cholecalciferol (VITAMIN D) 1000 UNITS tablet  Take 1,000 Units by mouth daily.     fluticasone (FLONASE) 50 MCG/ACT nasal spray Place 1 spray into both nostrils daily as needed for allergies or rhinitis.     letrozole (FEMARA) 2.5 MG tablet Take 1 tablet (2.5 mg total) by mouth daily. 90 tablet 3   loratadine (CLARITIN) 10 MG tablet Take 10 mg by mouth  daily as needed for allergies.     Multiple Vitamin (MULTIVITAMIN) tablet Take 1 tablet by mouth daily.     No current facility-administered medications for this visit.    PHYSICAL EXAMINATION: ECOG PERFORMANCE STATUS: 1 - Symptomatic but completely ambulatory  Vitals:   03/02/22 1023  BP: 132/75  Pulse: 75  Resp: 18  Temp: (!) 97.2 F (36.2 C)  SpO2: 100%   Filed Weights   03/02/22 1023  Weight: 123 lb 6.4 oz (56 kg)      LABORATORY DATA:  I have reviewed the data as listed    Latest Ref Rng & Units 01/19/2022    8:26 AM 12/08/2021    9:56 AM 10/27/2021    9:30 AM  CMP  Glucose 70 - 99 mg/dL 78  90  97   BUN 6 - 20 mg/dL _0 Creatinine 0.44 - 1.00 mg/dL 0.74  0.74  0.73   Sodium 135 - 145 mmol/L 142  142  141   Potassium 3.5 - 5.1 mmol/L 4.3  4.2  4.1   Chloride 98 - 111 mmol/L 106  109  108   CO2 22 - 32 mmol/L _1 Calcium 8.9 - 10.3 mg/dL 9.8  9.8  9.4   Total Protein 6.5 - 8.1 g/dL 7.1  6.7  7.0   Total Bilirubin 0.3 - 1.2 mg/dL 0.6  0.5  0.4   Alkaline Phos 38 - 126 U/L 62  59  79   AST 15 - 41 U/L _2 ALT 0 - 44 U/L _3 Lab Results  Component Value Date   WBC 3.7 (L) 01/19/2022   HGB 13.1 01/19/2022   HCT 39.5 01/19/2022   MCV 90.4 01/19/2022   PLT 243 01/19/2022   NEUTROABS 2.1 01/19/2022    ASSESSMENT & PLAN:  Malignant neoplasm of upper-outer quadrant of right breast in female, estrogen receptor positive ([place]) 03/14/2021:Palpable right breast mass diagnostic mammogram: 1.6 cm mass in the right breast at 9 o'clock. 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  04/20/21: Rt Lumpectomy: Grade 3 IDC 0/5 Ln neg, Margins Neg, Her2+, Copy #5.85, ratio 3.16 ER+(60%)/PR+(0%), Ki-67 80%.   Treatment Plan: 1. adjuvant chemotherapy with Taxol Herceptin followed by Herceptin maintenance started 05/18/2021 2.  Adjuvant radiation therapy completed 09/13/2021 3.   Adjuvant antiestrogen therapy with letrozole started 10/05/2021 Zometa infusion every 6 months x2 years --------------------------------------------------------------------------------------------------------------------------------------------------- Current treatment: Herceptin (until January 2024), letrozole started 10/05/2021 Echocardiogram 01/08/2022: EF 55-60% Labs reviewed Toxicities: None   Bladder issues: Referred to urology, seeing Dr. Claudia Desanctis (feels that it is related to prolapse)   Letrozole toxicities: Denies any adverse effects to letrozole Osteoporosis: Patient has a T score of -2.7 in the back and [facility]  Zometa every 6 months for 2 years.  Next Zometa will be December 2023.   We discussed Signatera testing for minimal residual disease after she completes with her treatment.  Patient requested that she only see me for follow-up. Mammograms have been ordered     I will see her on her last treatment day with Herceptin treatment on 05/03/2021.    No orders of the defined types were placed in this encounter.  The patient has a good understanding of the overall plan. she agrees with it. she will call with any problems that may develop before the next visit here. Total time spent: 30 mins including face to face time and time spent for planning, charting and co-ordination of care   Harriette Ohara, MD 03/02/22    I Gardiner Coins am scribing for Dr. Lindi Adie  I have reviewed the above documentation for accuracy and completeness, and I agree with the above.

## 2022-03-02 ENCOUNTER — Inpatient Hospital Stay (HOSPITAL_BASED_OUTPATIENT_CLINIC_OR_DEPARTMENT_OTHER): Payer: Managed Care, Other (non HMO) | Attending: Hematology and Oncology | Admitting: Hematology and Oncology

## 2022-03-02 ENCOUNTER — Inpatient Hospital Stay: Payer: Managed Care, Other (non HMO)

## 2022-03-02 ENCOUNTER — Inpatient Hospital Stay: Payer: Managed Care, Other (non HMO) | Admitting: Hematology and Oncology

## 2022-03-02 ENCOUNTER — Other Ambulatory Visit: Payer: Self-pay | Admitting: *Deleted

## 2022-03-02 ENCOUNTER — Encounter: Payer: Self-pay | Admitting: Hematology and Oncology

## 2022-03-02 ENCOUNTER — Inpatient Hospital Stay: Payer: Managed Care, Other (non HMO) | Attending: Hematology and Oncology

## 2022-03-02 VITALS — BP 132/75 | HR 75 | Temp 97.2°F | Resp 18 | Ht 65.0 in | Wt 123.4 lb

## 2022-03-02 VITALS — BP 121/73 | HR 70 | Resp 17

## 2022-03-02 DIAGNOSIS — Z79811 Long term (current) use of aromatase inhibitors: Secondary | ICD-10-CM | POA: Insufficient documentation

## 2022-03-02 DIAGNOSIS — M810 Age-related osteoporosis without current pathological fracture: Secondary | ICD-10-CM | POA: Diagnosis not present

## 2022-03-02 DIAGNOSIS — Z5112 Encounter for antineoplastic immunotherapy: Secondary | ICD-10-CM | POA: Diagnosis not present

## 2022-03-02 DIAGNOSIS — Z95828 Presence of other vascular implants and grafts: Secondary | ICD-10-CM

## 2022-03-02 DIAGNOSIS — Z170 Estrogen receptor positive status [ER+]: Secondary | ICD-10-CM | POA: Insufficient documentation

## 2022-03-02 DIAGNOSIS — C50411 Malignant neoplasm of upper-outer quadrant of right female breast: Secondary | ICD-10-CM | POA: Diagnosis not present

## 2022-03-02 DIAGNOSIS — Z5181 Encounter for therapeutic drug level monitoring: Secondary | ICD-10-CM

## 2022-03-02 MED ORDER — TRASTUZUMAB-ANNS CHEMO 150 MG IV SOLR
6.0000 mg/kg | Freq: Once | INTRAVENOUS | Status: AC
  Administered 2022-03-02: 336 mg via INTRAVENOUS
  Filled 2022-03-02: qty 16

## 2022-03-02 MED ORDER — SODIUM CHLORIDE 0.9% FLUSH
10.0000 mL | INTRAVENOUS | Status: DC | PRN
  Administered 2022-03-02: 10 mL

## 2022-03-02 MED ORDER — ALTEPLASE 2 MG IJ SOLR: 2.0000 mg | Freq: Once | INTRAMUSCULAR | Status: DC

## 2022-03-02 MED ORDER — HEPARIN SOD (PORK) LOCK FLUSH 100 UNIT/ML IV SOLN: 500.0000 [IU] | Freq: Once | INTRAVENOUS | Status: DC

## 2022-03-02 MED ORDER — DIPHENHYDRAMINE HCL 25 MG PO CAPS
50.0000 mg | ORAL_CAPSULE | Freq: Once | ORAL | Status: AC
  Administered 2022-03-02: 50 mg via ORAL
  Filled 2022-03-02: qty 2

## 2022-03-02 MED ORDER — ACETAMINOPHEN 325 MG PO TABS
650.0000 mg | ORAL_TABLET | Freq: Once | ORAL | Status: AC
  Administered 2022-03-02: 650 mg via ORAL
  Filled 2022-03-02: qty 2

## 2022-03-02 MED ORDER — SODIUM CHLORIDE 0.9% FLUSH
10.0000 mL | Freq: Once | INTRAVENOUS | Status: AC
  Administered 2022-03-02: 10 mL

## 2022-03-02 MED ORDER — SODIUM CHLORIDE 0.9 % IV SOLN: Freq: Once | INTRAVENOUS | Status: AC

## 2022-03-02 MED ORDER — SODIUM CHLORIDE 0.9% FLUSH: 10.0000 mL | Freq: Once | INTRAVENOUS | Status: DC

## 2022-03-02 MED ORDER — HEPARIN SOD (PORK) LOCK FLUSH 100 UNIT/ML IV SOLN
500.0000 [IU] | Freq: Once | INTRAVENOUS | Status: AC | PRN
  Administered 2022-03-02: 500 [IU]

## 2022-03-02 NOTE — Assessment & Plan Note (Signed)
03/14/2021:Palpable right breast mass diagnostic mammogram: 1.6 cm mass in the right breast at 9 o'clock. 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  04/20/21: Rt Lumpectomy: Grade 3 IDC 0/5 Ln neg, Margins Neg, Her2+, Copy #5.85, ratio 3.16 ER+(60%)/PR+(0%), Ki-67 80%.   Treatment Plan: 1. adjuvant chemotherapy with Taxol Herceptin followed by Herceptin maintenance started 05/18/2021 2.  Adjuvant radiation therapy completed 09/13/2021 3.  Adjuvant antiestrogen therapy with letrozole started 10/05/2021 Zometa infusion every 6 months x2 years --------------------------------------------------------------------------------------------------------------------------------------------------- Current treatment: Herceptin (until January 2024), letrozole started 10/06/2018 Echocardiogram 01/08/2022: EF 55-60% Labs reviewed Toxicities: None   Bladder issues: Referred to urology, seeing Dr. Claudia Desanctis (feels that it is related to prolapse)   Letrozole toxicities: Denies any adverse effects to letrozole Osteoporosis: Patient has a T score of -2.7 in the back and [facility]  Zometa every 6 months for 2 years.  Next Zometa will be December 2023.   We discussed Signatera testing for minimal residual disease after she completes with her treatment.   Patient requested that she only see me for follow-up. Mammograms have been ordered for November    return to clinic every 3 weeks for Herceptin and every 6 weeks for follow-up with me

## 2022-03-02 NOTE — Patient Instructions (Signed)
[facility] ONCOLOGY   Discharge Instructions: Thank you for choosing [facility] to provide your oncology and hematology care.   If you have a lab appointment with the [facility], please go directly to the [facility] and check in at the registration area.   Wear comfortable clothing and clothing appropriate for easy access to any Portacath or PICC line.   We strive to give you quality time with your provider. You may need to reschedule your appointment if you arrive late (15 or more minutes).  Arriving late affects you and other patients whose appointments are after yours.  Also, if you miss three or more appointments without notifying the office, you may be dismissed from the clinic at the provider's discretion.      For prescription refill requests, have your pharmacy contact our office and allow 72 hours for refills to be completed.    Today you received the following chemotherapy and/or immunotherapy agents: Trastuzumab-anns      To help prevent nausea and vomiting after your treatment, we encourage you to take your nausea medication as directed.  BELOW ARE SYMPTOMS THAT SHOULD BE REPORTED IMMEDIATELY: *FEVER GREATER THAN 100.4 F (38 C) OR HIGHER *CHILLS OR SWEATING *NAUSEA AND VOMITING THAT IS NOT CONTROLLED WITH YOUR NAUSEA MEDICATION *UNUSUAL SHORTNESS OF BREATH *UNUSUAL BRUISING OR BLEEDING *URINARY PROBLEMS (pain or burning when urinating, or frequent urination) *BOWEL PROBLEMS (unusual diarrhea, constipation, pain near the anus) TENDERNESS IN MOUTH AND THROAT WITH OR WITHOUT PRESENCE OF ULCERS (sore throat, sores in mouth, or a toothache) UNUSUAL RASH, SWELLING OR PAIN  UNUSUAL VAGINAL DISCHARGE OR ITCHING   Items with * indicate a potential emergency and should be followed up as soon as possible or go to the Emergency Department if any problems should occur.  Please show the CHEMOTHERAPY ALERT CARD or IMMUNOTHERAPY ALERT CARD at  check-in to the Emergency Department and triage nurse.  Should you have questions after your visit or need to cancel or reschedule your appointment, please contact [facility]  Dept: [phone]  and follow the prompts.  Office hours are 8:00 a.m. to 4:30 p.m. Monday - Friday. Please note that voicemails left after 4:00 p.m. may not be returned until the following business day.  We are closed weekends and major holidays. You have access to a nurse at all times for urgent questions. Please call the main number to the clinic Dept: [phone] and follow the prompts.   For any non-urgent questions, you may also contact your provider using MyChart. We now offer e-Visits for anyone 58 and older to request care online for non-urgent symptoms. For details visit [email or web address].   Also download the MyChart app! Go to the app store, search "MyChart", open the app, select [facility], and log in with your MyChart username and password.  Masks are optional in the cancer centers. If you would like for your care team to wear a mask while they are taking care of you, please let them know. For doctor visits, patients may have with them one support person who is at least 58 years old. At this time, visitors are not allowed in the infusion area.

## 2022-03-08 ENCOUNTER — Ambulatory Visit
Admission: RE | Admit: 2022-03-08 | Discharge: 2022-03-08 | Disposition: A | Discharge status: Home / Self Care | Payer: Managed Care, Other (non HMO) | Source: Ambulatory Visit | Attending: Adult Health | Admitting: Adult Health

## 2022-03-08 DIAGNOSIS — Z170 Estrogen receptor positive status [ER+]: Secondary | ICD-10-CM

## 2022-03-12 ENCOUNTER — Other Ambulatory Visit: Payer: Self-pay

## 2022-03-12 ENCOUNTER — Telehealth: Payer: Self-pay | Admitting: Hematology and Oncology

## 2022-03-12 NOTE — Telephone Encounter (Signed)
Scheduled appointment per patient. Patient does not want to have a SCP appointment at this time and wanted to be scheduled back with Dr.[name] for her last treatment. Patient is aware of the changes made to her upcoming appointment.

## 2022-03-13 ENCOUNTER — Telehealth: Payer: Self-pay | Admitting: *Deleted

## 2022-03-13 NOTE — Telephone Encounter (Signed)
"  Susan Reid, [facility] nurse advocate (Phone: [phone] ext [phone], Fax: [phone]) requesting specific information about this patient to assist with services, admissions trials and more.  Please send recent office notes."  Recent office note 03/02/2022 routed to [facility].

## 2022-03-22 ENCOUNTER — Other Ambulatory Visit: Payer: Self-pay | Admitting: *Deleted

## 2022-03-22 DIAGNOSIS — Z79899 Other long term (current) drug therapy: Secondary | ICD-10-CM

## 2022-03-23 ENCOUNTER — Inpatient Hospital Stay: Payer: Managed Care, Other (non HMO) | Admitting: Hematology and Oncology

## 2022-03-23 ENCOUNTER — Inpatient Hospital Stay: Payer: Managed Care, Other (non HMO)

## 2022-03-23 ENCOUNTER — Inpatient Hospital Stay: Payer: Managed Care, Other (non HMO) | Attending: Hematology and Oncology

## 2022-03-23 VITALS — BP 121/74 | HR 63 | Temp 98.2°F | Resp 16 | Wt 121.5 lb

## 2022-03-23 DIAGNOSIS — Z5112 Encounter for antineoplastic immunotherapy: Secondary | ICD-10-CM | POA: Diagnosis present

## 2022-03-23 DIAGNOSIS — Z5181 Encounter for therapeutic drug level monitoring: Secondary | ICD-10-CM

## 2022-03-23 DIAGNOSIS — Z95828 Presence of other vascular implants and grafts: Secondary | ICD-10-CM

## 2022-03-23 DIAGNOSIS — C50411 Malignant neoplasm of upper-outer quadrant of right female breast: Secondary | ICD-10-CM | POA: Diagnosis present

## 2022-03-23 DIAGNOSIS — Z170 Estrogen receptor positive status [ER+]: Secondary | ICD-10-CM

## 2022-03-23 LAB — CBC WITH DIFFERENTIAL (CANCER CENTER ONLY)
Abs Immature Granulocytes: 0 10*3/uL (ref 0.00–0.07)
Basophils Absolute: 0 10*3/uL (ref 0.0–0.1)
Basophils Relative: 1 %
Eosinophils Absolute: 0.2 10*3/uL (ref 0.0–0.5)
Eosinophils Relative: 4 %
HCT: 40.3 % (ref 36.0–46.0)
Hemoglobin: 13.3 g/dL (ref 12.0–15.0)
Immature Granulocytes: 0 %
Lymphocytes Relative: 29 %
Lymphs Abs: 1 10*3/uL (ref 0.7–4.0)
MCH: 30.9 pg (ref 26.0–34.0)
MCHC: 33 g/dL (ref 30.0–36.0)
MCV: 93.7 fL (ref 80.0–100.0)
Monocytes Absolute: 0.4 10*3/uL (ref 0.1–1.0)
Monocytes Relative: 11 %
Neutro Abs: 2 10*3/uL (ref 1.7–7.7)
Neutrophils Relative %: 55 %
Platelet Count: 220 10*3/uL (ref 150–400)
RBC: 4.3 MIL/uL (ref 3.87–5.11)
RDW: 12.3 % (ref 11.5–15.5)
WBC Count: 3.6 10*3/uL — ABNORMAL LOW (ref 4.0–10.5)
nRBC: 0 % (ref 0.0–0.2)

## 2022-03-23 LAB — CMP (CANCER CENTER ONLY)
ALT: 21 U/L (ref 0–44)
AST: 23 U/L (ref 15–41)
Albumin: 4.6 g/dL (ref 3.5–5.0)
Alkaline Phosphatase: 62 U/L (ref 38–126)
Anion gap: 7 (ref 5–15)
BUN: 21 mg/dL — ABNORMAL HIGH (ref 6–20)
CO2: 28 mmol/L (ref 22–32)
Calcium: 9.9 mg/dL (ref 8.9–10.3)
Chloride: 106 mmol/L (ref 98–111)
Creatinine: 0.76 mg/dL (ref 0.44–1.00)
GFR, Estimated: >60 mL/min (ref >60)
Glucose, Bld: 84 mg/dL (ref 70–99)
Potassium: 4.1 mmol/L (ref 3.5–5.1)
Sodium: 141 mmol/L (ref 135–145)
Total Bilirubin: 0.5 mg/dL (ref 0.3–1.2)
Total Protein: 7.1 g/dL (ref 6.5–8.1)

## 2022-03-23 MED ORDER — SODIUM CHLORIDE 0.9% FLUSH
10.0000 mL | Freq: Once | INTRAVENOUS | Status: AC
  Administered 2022-03-23: 10 mL

## 2022-03-23 MED ORDER — ACETAMINOPHEN 325 MG PO TABS
650.0000 mg | ORAL_TABLET | Freq: Once | ORAL | Status: AC
  Administered 2022-03-23: 650 mg via ORAL
  Filled 2022-03-23: qty 2

## 2022-03-23 MED ORDER — SODIUM CHLORIDE 0.9 % IV SOLN: Freq: Once | INTRAVENOUS | Status: AC

## 2022-03-23 MED ORDER — TRASTUZUMAB-ANNS CHEMO 150 MG IV SOLR
6.0000 mg/kg | Freq: Once | INTRAVENOUS | Status: AC
  Administered 2022-03-23: 336 mg via INTRAVENOUS
  Filled 2022-03-23: qty 16

## 2022-03-23 MED ORDER — HEPARIN SOD (PORK) LOCK FLUSH 100 UNIT/ML IV SOLN
500.0000 [IU] | Freq: Once | INTRAVENOUS | Status: AC | PRN
  Administered 2022-03-23: 500 [IU]

## 2022-03-23 MED ORDER — ZOLEDRONIC ACID 4 MG/100ML IV SOLN
4.0000 mg | Freq: Once | INTRAVENOUS | Status: AC
  Administered 2022-03-23: 4 mg via INTRAVENOUS
  Filled 2022-03-23: qty 100

## 2022-03-23 MED ORDER — DIPHENHYDRAMINE HCL 25 MG PO CAPS
50.0000 mg | ORAL_CAPSULE | Freq: Once | ORAL | Status: AC
  Administered 2022-03-23: 50 mg via ORAL
  Filled 2022-03-23: qty 2

## 2022-03-23 MED ORDER — SODIUM CHLORIDE 0.9% FLUSH
10.0000 mL | INTRAVENOUS | Status: DC | PRN
  Administered 2022-03-23: 10 mL

## 2022-03-23 NOTE — Patient Instructions (Signed)
[facility]COL[facility]   Discharge Instructions: Thank you for choosing [facility] to provide your oncology and hematology care.   If you have a lab appointment with the [facility], please go directly to the [facility] and check in at the registration area.   Wear comfortable clothing and clothing appropriate for easy access to any Portacath or PICC line.   We strive to give you quality time with your provider. You may need to reschedule your appointment if you arrive late (15 or more minutes).  Arriving late affects you and other patients whose appointments are after yours.  Also, if you miss three or more appointments without notifying the office, you may be dismissed from the clinic at the provider's discretion.      For prescription refill requests, have your pharmacy contact our office and allow 72 hours for refills to be completed.    Today you received the following chemotherapy and/or immunotherapy agents: Trastuzumab-anns      To help prevent nausea and vomiting after your treatment, we encourage you to take your nausea medication as directed.  BELOW ARE SYMPTOMS THAT SHOULD BE REPORTED IMMEDIATELY: *FEVER GREATER THAN 100.4 F (38 C) OR HIGHER *CHILLS OR SWEATING *NAUSEA AND VOMITING THAT IS NOT CONTROLLED WITH YOUR NAUSEA MEDICATION *UNUSUAL SHORTNESS OF BREATH *UNUSUAL BRUISING OR BLEEDING *URINARY PROBLEMS (pain or burning when urinating, or frequent urination) *BOWEL PROBLEMS (unusual diarrhea, constipation, pain near the anus) TENDERNESS IN MOUTH AND THROAT WITH OR WITHOUT PRESENCE OF ULCERS (sore throat, sores in mouth, or a toothache) UNUSUAL RASH, SWELLING OR PAIN  UNUSUAL VAGINAL DISCHARGE OR ITCHING   Items with * indicate a potential emergency and should be followed up as soon as possible or go to the Emergency Department if any problems should occur.  Please show the CHEMOTHERAPY ALERT CARD or IMMUNOTHERAPY ALERT CARD at  check-in to the Emergency Department and triage nurse.  Should you have questions after your visit or need to cancel or reschedule your appointment, please contact [facility]  Dept: [phone]  and follow the prompts.  Office hours are 8:00 a.m. to 4:30 p.m. Monday - Friday. Please note that voicemails left after 4:00 p.m. may not be returned until the following business day.  We are closed weekends and major holidays. You have access to a nurse at all times for urgent questions. Please call the main number to the clinic Dept: [phone] and follow the prompts.   For any non-urgent questions, you may also contact your provider using MyChart. We now offer e-Visits for anyone 54 and older to request care online for non-urgent symptoms. For details visit [email or web address].   Also download the MyChart app! Go to the app store, search "MyChart", open the app, select [facility], and log in with your MyChart username and password.  Masks are optional in the cancer centers. If you would like for your care team to wear a mask while they are taking care of you, please let them know. For doctor visits, patients may have with them one support person who is at least 58 years old. At this time, visitors are not allowed in the infusion area.

## 2022-04-02 ENCOUNTER — Ambulatory Visit: Payer: Managed Care, Other (non HMO) | Attending: General Surgery

## 2022-04-02 VITALS — Wt 121.5 lb

## 2022-04-02 DIAGNOSIS — Z483 Aftercare following surgery for neoplasm: Secondary | ICD-10-CM | POA: Insufficient documentation

## 2022-04-02 NOTE — Therapy (Signed)
  OUTPATIENT PHYSICAL THERAPY SOZO SCREENING NOTE   Patient Name: Susan Reid MRN: [id number] DOB:[birth year], 56 y.o., female Today's Date: 04/02/2022  PCP: Waunita Schooner, MD REFERRING PROVIDER: Stark Klein, MD   PT End of Session - 04/02/22 1606     Visit Number 11   # unchanged due to screen only   PT Start Time 7829    PT Stop Time 1609    PT Time Calculation (min) 4 min    Activity Tolerance Patient tolerated treatment well    Behavior During Therapy WFL for tasks assessed/performed             Past Medical History:  Diagnosis Date   Allergy    Family history of adverse reaction to anesthesia    Mother got severe N/V and pneumonia following anesthesia   History of radiation therapy    Right breast- 08/16/21-09/13/21- Dr. Gery Pray   Osteopenia    Past Surgical History:  Procedure Laterality Date   AXILLARY SENTINEL NODE BIOPSY Right 04/20/2021   Procedure: RIGHT AXILLARY SENTINEL NODE BIOPSY;  Surgeon: Stark Klein, MD;  Location: [facility];  Service: General;  Laterality: Right;   BREAST BIOPSY Right 02/25/2020   [place]   BREAST BIOPSY Right 03/14/2021   BREAST LUMPECTOMY Right 02/2021   BREAST LUMPECTOMY WITH SENTINEL LYMPH NODE BIOPSY Right 04/20/2021   Procedure: RIGHT BREAST LUMPECTOMY;  Surgeon: Stark Klein, MD;  Location: [facility];  Service: General;  Laterality: Right;   PORTACATH PLACEMENT Left 04/20/2021   Procedure: INSERTION PORT-A-CATH;  Surgeon: Stark Klein, MD;  Location: [facility];  Service: General;  Laterality: Left;   [place] EXTRACTION  04/24/1987   Patient Active Problem List   Diagnosis Date Noted   Port-A-Cath in place 05/18/2021   Genetic testing 04/03/2021   Family history of breast cancer 03/23/2021   Malignant neoplasm of upper-outer quadrant of right breast in female, estrogen receptor positive ([place]) 03/20/2021   Osteoporosis 12/10/2018    REFERRING DIAG: right breast cancer at risk for lymphedema  THERAPY DIAG: Aftercare  following surgery for neoplasm  PERTINENT HISTORY: Patient was diagnosed on 03/07/2021 with 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 She had a right lumpectomy and senitnel node biopsy (5 negative nodes) on 04/20/2021. It is ER positive, PR negative, and HER2 positive with a Ki67 of 80%.   PRECAUTIONS: right UE Lymphedema risk, None  SUBJECTIVE: Pt returns for her 3 month L-Dex screen.   PAIN:  Are you having pain? No  SOZO SCREENING: Patient was assessed today using the SOZO machine to determine the lymphedema index score. This was compared to her baseline score. It was determined that she is within the recommended range when compared to her baseline and no further action is needed at this time. She will continue SOZO screenings. These are done every 3 months for 2 years post operatively followed by every 6 months for 2 years, and then annually.   L-DEX FLOWSHEETS - 04/02/22 1600       L-DEX LYMPHEDEMA SCREENING   Measurement Type Unilateral    L-DEX MEASUREMENT EXTREMITY Upper Extremity    POSITION  Standing    DOMINANT SIDE Right    At Risk Side Right    BASELINE SCORE (UNILATERAL) 2.8    L-DEX SCORE (UNILATERAL) -0.8    VALUE CHANGE (UNILAT) -3.6               Otelia Limes, PTA 04/02/2022, 4:07 PM

## 2022-04-03 ENCOUNTER — Other Ambulatory Visit: Payer: Self-pay

## 2022-04-05 ENCOUNTER — Encounter: Payer: Self-pay | Admitting: Hematology and Oncology

## 2022-04-06 ENCOUNTER — Telehealth: Payer: Self-pay | Admitting: Hematology and Oncology

## 2022-04-06 NOTE — Telephone Encounter (Signed)
Patient called to r/s December appointment. R/s infusion and sending f/u to Dr. Geralyn Flash scheduler for the day to see if other appointments need to be moved.

## 2022-04-07 ENCOUNTER — Encounter: Payer: Self-pay | Admitting: Hematology and Oncology

## 2022-04-09 ENCOUNTER — Encounter: Payer: Self-pay | Admitting: Hematology and Oncology

## 2022-04-12 ENCOUNTER — Inpatient Hospital Stay: Payer: Managed Care, Other (non HMO)

## 2022-04-13 ENCOUNTER — Inpatient Hospital Stay: Payer: Managed Care, Other (non HMO)

## 2022-04-17 ENCOUNTER — Inpatient Hospital Stay: Payer: Managed Care, Other (non HMO)

## 2022-04-17 VITALS — BP 137/76 | HR 76 | Temp 98.3°F | Resp 17 | Wt 122.5 lb

## 2022-04-17 DIAGNOSIS — Z5112 Encounter for antineoplastic immunotherapy: Secondary | ICD-10-CM | POA: Diagnosis not present

## 2022-04-17 DIAGNOSIS — Z170 Estrogen receptor positive status [ER+]: Secondary | ICD-10-CM

## 2022-04-17 MED ORDER — SODIUM CHLORIDE 0.9% FLUSH
10.0000 mL | INTRAVENOUS | Status: DC | PRN
  Administered 2022-04-17: 10 mL

## 2022-04-17 MED ORDER — TRASTUZUMAB-ANNS CHEMO 150 MG IV SOLR
6.0000 mg/kg | Freq: Once | INTRAVENOUS | Status: AC
  Administered 2022-04-17: 336 mg via INTRAVENOUS
  Filled 2022-04-17: qty 16

## 2022-04-17 MED ORDER — DIPHENHYDRAMINE HCL 25 MG PO CAPS
50.0000 mg | ORAL_CAPSULE | Freq: Once | ORAL | Status: AC
  Administered 2022-04-17: 50 mg via ORAL
  Filled 2022-04-17: qty 2

## 2022-04-17 MED ORDER — HEPARIN SOD (PORK) LOCK FLUSH 100 UNIT/ML IV SOLN
500.0000 [IU] | Freq: Once | INTRAVENOUS | Status: AC | PRN
  Administered 2022-04-17: 500 [IU]

## 2022-04-17 MED ORDER — SODIUM CHLORIDE 0.9 % IV SOLN: Freq: Once | INTRAVENOUS | Status: AC

## 2022-04-17 MED ORDER — ACETAMINOPHEN 325 MG PO TABS
650.0000 mg | ORAL_TABLET | Freq: Once | ORAL | Status: AC
  Administered 2022-04-17: 650 mg via ORAL
  Filled 2022-04-17: qty 2

## 2022-04-17 NOTE — Patient Instructions (Signed)
[facility]  Discharge Instructions: Thank you for choosing [facility] to provide your oncology and hematology care.   If you have a lab appointment with the Cancer Center, please go directly to the Cancer Center and check in at the registration area.   Wear comfortable clothing and clothing appropriate for easy access to any Portacath or PICC line.   We strive to give you quality time with your provider. You may need to reschedule your appointment if you arrive late (15 or more minutes).  Arriving late affects you and other patients whose appointments are after yours.  Also, if you miss three or more appointments without notifying the office, you may be dismissed from the clinic at the provider's discretion.      For prescription refill requests, have your pharmacy contact our office and allow 72 hours for refills to be completed.    Today you received the following chemotherapy and/or immunotherapy agents: trastuzumab      To help prevent nausea and vomiting after your treatment, we encourage you to take your nausea medication as directed.  BELOW ARE SYMPTOMS THAT SHOULD BE REPORTED IMMEDIATELY: *FEVER GREATER THAN 100.4 F (38 C) OR HIGHER *CHILLS OR SWEATING *NAUSEA AND VOMITING THAT IS NOT CONTROLLED WITH YOUR NAUSEA MEDICATION *UNUSUAL SHORTNESS OF BREATH *UNUSUAL BRUISING OR BLEEDING *URINARY PROBLEMS (pain or burning when urinating, or frequent urination) *BOWEL PROBLEMS (unusual diarrhea, constipation, pain near the anus) TENDERNESS IN MOUTH AND THROAT WITH OR WITHOUT PRESENCE OF ULCERS (sore throat, sores in mouth, or a toothache) UNUSUAL RASH, SWELLING OR PAIN  UNUSUAL VAGINAL DISCHARGE OR ITCHING   Items with * indicate a potential emergency and should be followed up as soon as possible or go to the Emergency Department if any problems should occur.  Please show the CHEMOTHERAPY ALERT CARD or IMMUNOTHERAPY ALERT CARD at check-in  to the Emergency Department and triage nurse.  Should you have questions after your visit or need to cancel or reschedule your appointment, please contact [facility]  Dept: [phone]  and follow the prompts.  Office hours are 8:00 a.m. to 4:30 p.m. Monday - Friday. Please note that voicemails left after 4:00 p.m. may not be returned until the following business day.  We are closed weekends and major holidays. You have access to a nurse at all times for urgent questions. Please call the main number to the clinic Dept: [phone] and follow the prompts.   For any non-urgent questions, you may also contact your provider using MyChart. We now offer e-Visits for anyone 18 and older to request care online for non-urgent symptoms. For details visit [email or web address].   Also download the MyChart app! Go to the app store, search "MyChart", open the app, select [facility], and log in with your MyChart username and password.  Masks are optional in the cancer centers. If you would like for your care team to wear a mask while they are taking care of you, please let them know. You may have one support person who is at least 58 years old accompany you for your appointments. 

## 2022-04-18 ENCOUNTER — Other Ambulatory Visit: Payer: Self-pay | Admitting: General Surgery

## 2022-04-19 ENCOUNTER — Ambulatory Visit (HOSPITAL_COMMUNITY)
Admission: RE | Admit: 2022-04-19 | Discharge: 2022-04-19 | Disposition: A | Discharge status: Home / Self Care | Payer: Managed Care, Other (non HMO) | Source: Ambulatory Visit | Attending: Hematology and Oncology | Admitting: Hematology and Oncology

## 2022-04-19 DIAGNOSIS — Z79633 Long term (current) use of mitotic inhibitor: Secondary | ICD-10-CM | POA: Diagnosis not present

## 2022-04-19 DIAGNOSIS — Z79631 Long term (current) use of antimetabolite agent: Secondary | ICD-10-CM | POA: Diagnosis not present

## 2022-04-19 DIAGNOSIS — C50411 Malignant neoplasm of upper-outer quadrant of right female breast: Secondary | ICD-10-CM | POA: Diagnosis not present

## 2022-04-19 DIAGNOSIS — Z79899 Other long term (current) drug therapy: Secondary | ICD-10-CM | POA: Diagnosis not present

## 2022-04-19 DIAGNOSIS — Z0189 Encounter for other specified special examinations: Secondary | ICD-10-CM

## 2022-04-19 DIAGNOSIS — Z5181 Encounter for therapeutic drug level monitoring: Secondary | ICD-10-CM | POA: Insufficient documentation

## 2022-04-19 DIAGNOSIS — Z7901 Long term (current) use of anticoagulants: Secondary | ICD-10-CM | POA: Insufficient documentation

## 2022-04-19 LAB — ECHOCARDIOGRAM COMPLETE
Area-P 1/2: 3.37 cm2
Calc EF: 54 %
S' Lateral: 3.4 cm
Single Plane A2C EF: 55.4 %
Single Plane A4C EF: 51.8 %

## 2022-04-19 NOTE — Progress Notes (Signed)
Echocardiogram 2D Echocardiogram has been performed.  Fidel Levy 04/19/2022, 1:58 PM

## 2022-05-01 ENCOUNTER — Encounter: Payer: Self-pay | Admitting: Hematology and Oncology

## 2022-05-03 ENCOUNTER — Inpatient Hospital Stay: Payer: 59 | Admitting: Adult Health

## 2022-05-03 ENCOUNTER — Inpatient Hospital Stay: Payer: 59 | Admitting: Hematology and Oncology

## 2022-05-03 ENCOUNTER — Inpatient Hospital Stay: Payer: 59

## 2022-05-08 ENCOUNTER — Inpatient Hospital Stay: Payer: 59 | Admitting: Hematology and Oncology

## 2022-05-08 ENCOUNTER — Inpatient Hospital Stay: Payer: 59

## 2022-05-08 ENCOUNTER — Inpatient Hospital Stay: Payer: 59 | Attending: Hematology and Oncology

## 2022-05-08 VITALS — BP 134/76 | HR 73 | Temp 97.7°F | Resp 14 | Wt 119.5 lb

## 2022-05-08 DIAGNOSIS — Z5112 Encounter for antineoplastic immunotherapy: Secondary | ICD-10-CM | POA: Diagnosis present

## 2022-05-08 DIAGNOSIS — M810 Age-related osteoporosis without current pathological fracture: Secondary | ICD-10-CM | POA: Diagnosis not present

## 2022-05-08 DIAGNOSIS — Z170 Estrogen receptor positive status [ER+]: Secondary | ICD-10-CM | POA: Diagnosis not present

## 2022-05-08 DIAGNOSIS — Z79811 Long term (current) use of aromatase inhibitors: Secondary | ICD-10-CM | POA: Diagnosis not present

## 2022-05-08 DIAGNOSIS — C50411 Malignant neoplasm of upper-outer quadrant of right female breast: Secondary | ICD-10-CM

## 2022-05-08 DIAGNOSIS — Z95828 Presence of other vascular implants and grafts: Secondary | ICD-10-CM

## 2022-05-08 DIAGNOSIS — Z5181 Encounter for therapeutic drug level monitoring: Secondary | ICD-10-CM

## 2022-05-08 LAB — CBC WITH DIFFERENTIAL (CANCER CENTER ONLY)
Abs Immature Granulocytes: 0 10*3/uL (ref 0.00–0.07)
Basophils Absolute: 0 10*3/uL (ref 0.0–0.1)
Basophils Relative: 1 %
Eosinophils Absolute: 0.2 10*3/uL (ref 0.0–0.5)
Eosinophils Relative: 4 %
HCT: 40.1 % (ref 36.0–46.0)
Hemoglobin: 13.5 g/dL (ref 12.0–15.0)
Immature Granulocytes: 0 %
Lymphocytes Relative: 28 %
Lymphs Abs: 1.2 10*3/uL (ref 0.7–4.0)
MCH: 30.8 pg (ref 26.0–34.0)
MCHC: 33.7 g/dL (ref 30.0–36.0)
MCV: 91.3 fL (ref 80.0–100.0)
Monocytes Absolute: 0.4 10*3/uL (ref 0.1–1.0)
Monocytes Relative: 10 %
Neutro Abs: 2.6 10*3/uL (ref 1.7–7.7)
Neutrophils Relative %: 57 %
Platelet Count: 227 10*3/uL (ref 150–400)
RBC: 4.39 MIL/uL (ref 3.87–5.11)
RDW: 12.1 % (ref 11.5–15.5)
WBC Count: 4.4 10*3/uL (ref 4.0–10.5)
nRBC: 0 % (ref 0.0–0.2)

## 2022-05-08 LAB — CMP (CANCER CENTER ONLY)
ALT: 15 U/L (ref 0–44)
AST: 20 U/L (ref 15–41)
Albumin: 4.3 g/dL (ref 3.5–5.0)
Alkaline Phosphatase: 56 U/L (ref 38–126)
Anion gap: 6 (ref 5–15)
BUN: 18 mg/dL (ref 6–20)
CO2: 29 mmol/L (ref 22–32)
Calcium: 9.7 mg/dL (ref 8.9–10.3)
Chloride: 107 mmol/L (ref 98–111)
Creatinine: 0.8 mg/dL (ref 0.44–1.00)
GFR, Estimated: >60 mL/min (ref >60)
Glucose, Bld: 71 mg/dL (ref 70–99)
Potassium: 4.2 mmol/L (ref 3.5–5.1)
Sodium: 142 mmol/L (ref 135–145)
Total Bilirubin: 0.5 mg/dL (ref 0.3–1.2)
Total Protein: 6.8 g/dL (ref 6.5–8.1)

## 2022-05-08 MED ORDER — HEPARIN SOD (PORK) LOCK FLUSH 100 UNIT/ML IV SOLN
500.0000 [IU] | Freq: Once | INTRAVENOUS | Status: AC | PRN
  Administered 2022-05-08: 500 [IU]

## 2022-05-08 MED ORDER — ACETAMINOPHEN 325 MG PO TABS
650.0000 mg | ORAL_TABLET | Freq: Once | ORAL | Status: AC
  Administered 2022-05-08: 650 mg via ORAL
  Filled 2022-05-08: qty 2

## 2022-05-08 MED ORDER — DIPHENHYDRAMINE HCL 25 MG PO CAPS
50.0000 mg | ORAL_CAPSULE | Freq: Once | ORAL | Status: AC
  Administered 2022-05-08: 50 mg via ORAL
  Filled 2022-05-08: qty 2

## 2022-05-08 MED ORDER — SODIUM CHLORIDE 0.9 % IV SOLN: Freq: Once | INTRAVENOUS | Status: AC

## 2022-05-08 MED ORDER — SODIUM CHLORIDE 0.9% FLUSH
10.0000 mL | INTRAVENOUS | Status: DC | PRN
  Administered 2022-05-08: 10 mL

## 2022-05-08 MED ORDER — SODIUM CHLORIDE 0.9% FLUSH
10.0000 mL | Freq: Once | INTRAVENOUS | Status: AC
  Administered 2022-05-08: 10 mL

## 2022-05-08 MED ORDER — TRASTUZUMAB-ANNS CHEMO 150 MG IV SOLR
6.0000 mg/kg | Freq: Once | INTRAVENOUS | Status: AC
  Administered 2022-05-08: 336 mg via INTRAVENOUS
  Filled 2022-05-08: qty 16

## 2022-05-08 NOTE — Progress Notes (Signed)
Patient Care Team: Waunita Schooner, MD as PCP - General (Family Medicine) Druscilla Brownie, MD as Consulting Physician (Dermatology) Stark Klein, MD as Consulting Physician (General Surgery) [name], MD as Consulting Physician (Hematology and Oncology) Gery Pray, MD as Consulting Physician (Radiation Oncology)  DIAGNOSIS:  Encounter Diagnosis  Name Primary?   Malignant neoplasm of upper-outer quadrant of right breast in female, estrogen receptor positive ([place]) Yes    SUMMARY OF ONCOLOGIC HISTORY: Oncology History  Malignant neoplasm of upper-outer quadrant of right breast in female, estrogen receptor positive ([place])  03/14/2021 Initial Diagnosis   Palpable right breast mass diagnostic mammogram: 1.6 cm mass in the right breast at 9 o'clock. 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    03/22/2021 Cancer Staging   Staging form: Breast, AJCC 8th Edition - Clinical stage from 03/22/2021: Stage IA (cT1b, cN0, cM0, G3, ER+, PR-, HER2+) - Signed by [name], MD on 03/22/2021 Stage prefix: Initial diagnosis Histologic grading system: 3 grade system    Genetic Testing   [facility] CustomNext Panel was Negative. Report date is 04/03/2021.  The CustomNext-Cancer+RNAinsight panel offered by [facility] includes sequencing and rearrangement analysis for the following 47 genes:  APC, ATM, AXIN2, BARD1, BMPR1A, BRCA1, BRCA2, BRIP1, CDH1, CDK4, CDKN2A, CHEK2, CTNNA1, DICER1, EPCAM, GREM1, HOXB13, KIT, MEN1, MLH1, MSH2, MSH3, MSH6, MUTYH, NBN, NF1, NTHL1, PALB2, PDGFRA, PMS2, POLD1, POLE, PTEN, RAD50, RAD51C, RAD51D, SDHA, SDHB, SDHC, SDHD, SMAD4, SMARCA4, STK11, TP53, TSC1, TSC2, and VHL.  RNA data is routinely analyzed for use in variant interpretation for all genes.   04/20/2021 Surgery   Rt Lumpectomy: Grade 3 IDC 1.9 cm 0/5 Ln neg, Margins Neg, Her2+, Copy #5.85, ratio 3.16 ER+(60%)/PR+(0%), Ki-67 80%   04/20/2021  Cancer Staging   Staging form: Breast, AJCC 8th Edition - Pathologic stage from 04/20/2021: Stage IA (pT1c, pN0, cM0, G3, ER+, PR-, HER2+) - Signed by [name], NP on 06/30/2021 Stage prefix: Initial diagnosis Histologic grading system: 3 grade system   05/18/2021 - 12/08/2021 Chemotherapy   Patient is on Treatment Plan : BREAST Paclitaxel + Trastuzumab q7d / Trastuzumab q21d     05/18/2021 -  Chemotherapy   Patient is on Treatment Plan : BREAST Paclitaxel + Trastuzumab q7d / Trastuzumab q21d     08/16/2021 - 09/13/2021 Radiation Therapy   Site Technique Total Dose (Gy) Dose per Fx (Gy) Completed Fx Beam Energies  Breast, Right: Breast_R 3D 40.05/40.05 2.67 15/15 10X  Breast, Right: Breast_R_Bst 3D 12/12 2 6/6 6X, 10X     10/05/2021 -  Anti-estrogen oral therapy   Letrozole x 5-7 years     CHIEF COMPLIANT: Follow-up right breast last day Herceptin on Letrozole   INTERVAL HISTORY: Susan Reid is a 57 y.o with the above-mentioned right breast cancer currently on herceptin and letrozole. She presents to the clinic for a follow-up.  She reports that she has leg aches at night time. She says mainly her left hip. She does stretch before bed. She doesn't know if it's coming fro the letrozole. She denies hot flashes. She reports the bladder issues has gotten better. Overall she is able to tolerate the letrozole.  ALLERGIES:  is allergic to nitrates, organic.  MEDICATIONS:  Current Outpatient Medications  Medication Sig Dispense Refill   acetaminophen (TYLENOL) 325 MG tablet Take 1,000 mg by mouth every 6 (six) hours as needed.     Ascorbic Acid (VITAMIN C PO) Take 500 mg by mouth daily.  Calcium Citrate 1040 MG TABS Take 2,080 mg of elemental calcium by mouth daily.     cholecalciferol (VITAMIN D) 1000 UNITS tablet Take 1,000 Units by mouth daily.     fluticasone (FLONASE) 50 MCG/ACT nasal spray Place 1 spray into both nostrils daily as needed for allergies or rhinitis.      letrozole (FEMARA) 2.5 MG tablet Take 1 tablet (2.5 mg total) by mouth daily. 90 tablet 3   loratadine (CLARITIN) 10 MG tablet Take 10 mg by mouth daily as needed for allergies.     Multiple Vitamin (MULTIVITAMIN) tablet Take 1 tablet by mouth daily.     No current facility-administered medications for this visit.    PHYSICAL EXAMINATION: ECOG PERFORMANCE STATUS: 1 - Symptomatic but completely ambulatory  Vitals:   05/08/22 0841  BP: 134/76  Pulse: 73  Resp: 14  Temp: 97.7 F (36.5 C)  SpO2: 99%   Filed Weights   05/08/22 0841  Weight: 119 lb 8 oz (54.2 kg)      LABORATORY DATA:  I have reviewed the data as listed    Latest Ref Rng & Units 03/23/2022    8:51 AM 01/19/2022    8:26 AM 12/08/2021    9:56 AM  CMP  Glucose 70 - 99 mg/dL 84  78  90   BUN 6 - 20 mg/dL '21  19  18   '$ Creatinine 0.44 - 1.00 mg/dL 0.76  0.74  0.74   Sodium 135 - 145 mmol/L 141  142  142   Potassium 3.5 - 5.1 mmol/L 4.1  4.3  4.2   Chloride 98 - 111 mmol/L 106  106  109   CO2 22 - 32 mmol/L '28  30  29   '$ Calcium 8.9 - 10.3 mg/dL 9.9  9.8  9.8   Total Protein 6.5 - 8.1 g/dL 7.1  7.1  6.7   Total Bilirubin 0.3 - 1.2 mg/dL 0.5  0.6  0.5   Alkaline Phos 38 - 126 U/L 62  62  59   AST 15 - 41 U/L '23  22  25   '$ ALT 0 - 44 U/L '21  16  22     '$ Lab Results  Component Value Date   WBC 4.4 05/08/2022   HGB 13.5 05/08/2022   HCT 40.1 05/08/2022   MCV 91.3 05/08/2022   PLT 227 05/08/2022   NEUTROABS 2.6 05/08/2022    ASSESSMENT & PLAN:  Malignant neoplasm of upper-outer quadrant of right breast in female, estrogen receptor positive ([place]) 03/14/2021:Palpable right breast mass diagnostic mammogram: 1.6 cm mass in the right breast at 9 o'clock. 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  04/20/21: Rt Lumpectomy: Grade 3 IDC 0/5 Ln neg, Margins Neg, Her2+, Copy #5.85, ratio 3.16 ER+(60%)/PR+(0%), Ki-67 80%.   Treatment Plan: 1. adjuvant  chemotherapy with Taxol Herceptin followed by Herceptin maintenance started 05/18/2021 2.  Adjuvant radiation therapy completed 09/13/2021 3.  Adjuvant antiestrogen therapy with letrozole started 10/05/2021 Zometa infusion every 6 months x2 years --------------------------------------------------------------------------------------------------------------------------------------------------- Current treatment: Herceptin (until January 2024), letrozole started 10/05/2021 Echocardiogram 03/2022: EF 55-60% Labs reviewed Toxicities: None   Bladder issues: Seeing urology.  Significantly better.   Letrozole toxicities: Denies any adverse effects to letrozole Osteoporosis: Patient has a T score of -2.7 in the back and [facility]  Zometa every 6 months for 2 years.     We discussed Signatera testing for minimal residual  disease after she completes with her treatment.   Patient requested that she only see me for follow-up.  Breast cancer surveillance: Mammogram 03/08/2022: Benign breast density category D   Today is her last Herceptin treatment. The patient is seeing Dr. Barry Dienes for port removal.  I will be seeing her once a year for follow-ups along with the Zometa.  She will be set up for 1-monthZometa infusion with lab.    No orders of the defined types were placed in this encounter.  The patient has a good understanding of the overall plan. she agrees with it. she will call with any problems that may develop before the next visit here. Total time spent: 30 mins including face to face time and time spent for planning, charting and co-ordination of care   VHarriette Ohara MD 05/08/22    I DGardiner Coinsam acting as a sEducation administratorfor [employer] I have reviewed the above documentation for accuracy and completeness, and I agree with the above.

## 2022-05-08 NOTE — Assessment & Plan Note (Addendum)
03/14/2021:Palpable right breast mass diagnostic mammogram: 1.6 cm mass in the right breast at 9 o'clock. 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  04/20/21: Rt Lumpectomy: Grade 3 IDC 0/5 Ln neg, Margins Neg, Her2+, Copy #5.85, ratio 3.16 ER+(60%)/PR+(0%), Ki-67 80%.   Treatment Plan: 1. adjuvant chemotherapy with Taxol Herceptin followed by Herceptin maintenance started 05/18/2021 2.  Adjuvant radiation therapy completed 09/13/2021 3.  Adjuvant antiestrogen therapy with letrozole started 10/05/2021 Zometa infusion every 6 months x2 years --------------------------------------------------------------------------------------------------------------------------------------------------- Current treatment: Herceptin (until January 2024), letrozole started 10/05/2021 Echocardiogram 03/2022: EF 55-60% Labs reviewed Toxicities: None   Bladder issues: Seeing urology.  Significantly better.   Letrozole toxicities: Denies any adverse effects to letrozole Osteoporosis: Patient has a T score of -2.7 in the back and this [facility].  Zometa every 6 months for 2 years.     We discussed Signatera testing for minimal residual disease after she completes with her treatment.   Patient requested that she only see me for follow-up.  Breast cancer surveillance: Mammogram 03/08/2022: Benign breast density category D   Today is her last Herceptin treatment. The patient is seeing Dr. Barry Dienes for port removal.  I will be seeing her once a year for follow-ups along with the Zometa.  She will be set up for 60-monthZometa infusion with lab.

## 2022-05-08 NOTE — Patient Instructions (Signed)
[facility]  Discharge Instructions: Thank you for choosing [facility] to provide your oncology and hematology care.   If you have a lab appointment with the Cancer Center, please go directly to the Cancer Center and check in at the registration area.   Wear comfortable clothing and clothing appropriate for easy access to any Portacath or PICC line.   We strive to give you quality time with your provider. You may need to reschedule your appointment if you arrive late (15 or more minutes).  Arriving late affects you and other patients whose appointments are after yours.  Also, if you miss three or more appointments without notifying the office, you may be dismissed from the clinic at the provider's discretion.      For prescription refill requests, have your pharmacy contact our office and allow 72 hours for refills to be completed.    Today you received the following chemotherapy and/or immunotherapy agents: trastuzumab      To help prevent nausea and vomiting after your treatment, we encourage you to take your nausea medication as directed.  BELOW ARE SYMPTOMS THAT SHOULD BE REPORTED IMMEDIATELY: *FEVER GREATER THAN 100.4 F (38 C) OR HIGHER *CHILLS OR SWEATING *NAUSEA AND VOMITING THAT IS NOT CONTROLLED WITH YOUR NAUSEA MEDICATION *UNUSUAL SHORTNESS OF BREATH *UNUSUAL BRUISING OR BLEEDING *URINARY PROBLEMS (pain or burning when urinating, or frequent urination) *BOWEL PROBLEMS (unusual diarrhea, constipation, pain near the anus) TENDERNESS IN MOUTH AND THROAT WITH OR WITHOUT PRESENCE OF ULCERS (sore throat, sores in mouth, or a toothache) UNUSUAL RASH, SWELLING OR PAIN  UNUSUAL VAGINAL DISCHARGE OR ITCHING   Items with * indicate a potential emergency and should be followed up as soon as possible or go to the Emergency Department if any problems should occur.  Please show the CHEMOTHERAPY ALERT CARD or IMMUNOTHERAPY ALERT CARD at check-in  to the Emergency Department and triage nurse.  Should you have questions after your visit or need to cancel or reschedule your appointment, please contact [facility]  Dept: [phone]  and follow the prompts.  Office hours are 8:00 a.m. to 4:30 p.m. Monday - Friday. Please note that voicemails left after 4:00 p.m. may not be returned until the following business day.  We are closed weekends and major holidays. You have access to a nurse at all times for urgent questions. Please call the main number to the clinic Dept: [phone] and follow the prompts.   For any non-urgent questions, you may also contact your provider using MyChart. We now offer e-Visits for anyone 18 and older to request care online for non-urgent symptoms. For details visit [email or web address].   Also download the MyChart app! Go to the app store, search "MyChart", open the app, select [facility], and log in with your MyChart username and password.  Masks are optional in the cancer centers. If you would like for your care team to wear a mask while they are taking care of you, please let them know. You may have one support person who is at least 59 years old accompany you for your appointments. 

## 2022-05-09 ENCOUNTER — Encounter (HOSPITAL_BASED_OUTPATIENT_CLINIC_OR_DEPARTMENT_OTHER): Payer: Self-pay | Admitting: General Surgery

## 2022-05-11 ENCOUNTER — Encounter: Payer: Self-pay | Admitting: *Deleted

## 2022-05-11 ENCOUNTER — Other Ambulatory Visit: Payer: Self-pay

## 2022-05-11 DIAGNOSIS — C50411 Malignant neoplasm of upper-outer quadrant of right female breast: Secondary | ICD-10-CM

## 2022-05-11 NOTE — Progress Notes (Signed)
Per md orders entered for signatera. Requisition and all supported documents faxed to [phone]. Faxed confirmation was received.  

## 2022-05-14 ENCOUNTER — Telehealth: Payer: Self-pay | Admitting: Hematology and Oncology

## 2022-05-14 NOTE — Telephone Encounter (Signed)
Scheduled appointment per 1/16 los. Patient is aware of all made appointments.

## 2022-05-15 ENCOUNTER — Other Ambulatory Visit: Payer: Self-pay

## 2022-05-15 NOTE — Anesthesia Preprocedure Evaluation (Signed)
Anesthesia Evaluation  Patient identified by MRN, date of birth, ID band Patient awake    Reviewed: Allergy & Precautions, NPO status , Patient's Chart, lab work & pertinent test results  History of Anesthesia Complications (+) Family history of anesthesia reaction  Airway Mallampati: II  TM Distance: >3 FB Neck ROM: Full    Dental no notable dental hx. (+) Dental Advisory Given, Teeth Intact   Pulmonary neg pulmonary ROS   Pulmonary exam normal        Cardiovascular negative cardio ROS  Rhythm:Regular Rate:Normal  Indwelling Portacath   Neuro/Psych negative neurological ROS  negative psych ROS   GI/Hepatic negative GI ROS, Neg liver ROS,,,  Endo/Other  negative endocrine ROS  Right breast Ca - S/P Lumpectomy, Chemo Rx and RT  Renal/GU negative Renal ROS  negative genitourinary   Musculoskeletal negative musculoskeletal ROS (+)  Breast Ca   Abdominal Normal abdominal exam  (+)   Peds  Hematology negative hematology ROS (+)   Anesthesia Other Findings   Reproductive/Obstetrics                              Anesthesia Physical Anesthesia Plan  ASA: 2  Anesthesia Plan: MAC   Post-op Pain Management: Minimal or no pain anticipated   Induction: Intravenous  PONV Risk Score and Plan: 3 and Treatment may vary due to age or medical condition  Airway Management Planned: Nasal Cannula, Simple Face Mask and Natural Airway  Additional Equipment: None  Intra-op Plan:   Post-operative Plan:   Informed Consent: I have reviewed the patients History and Physical, chart, labs and discussed the procedure including the risks, benefits and alternatives for the proposed anesthesia with the patient or authorized representative who has indicated his/her understanding and acceptance.     Dental advisory given  Plan Discussed with: CRNA and Anesthesiologist  Anesthesia Plan Comments:           Anesthesia Quick Evaluation

## 2022-05-16 ENCOUNTER — Encounter (HOSPITAL_BASED_OUTPATIENT_CLINIC_OR_DEPARTMENT_OTHER): Payer: Self-pay | Admitting: General Surgery

## 2022-05-16 ENCOUNTER — Other Ambulatory Visit: Payer: Self-pay

## 2022-05-16 ENCOUNTER — Encounter (HOSPITAL_BASED_OUTPATIENT_CLINIC_OR_DEPARTMENT_OTHER)
Admission: RE | Disposition: A | Discharge status: Home / Self Care | Payer: Self-pay | Source: Ambulatory Visit | Attending: General Surgery

## 2022-05-16 ENCOUNTER — Ambulatory Visit (HOSPITAL_BASED_OUTPATIENT_CLINIC_OR_DEPARTMENT_OTHER): Payer: 59 | Admitting: Anesthesiology

## 2022-05-16 ENCOUNTER — Ambulatory Visit (HOSPITAL_BASED_OUTPATIENT_CLINIC_OR_DEPARTMENT_OTHER)
Admission: RE | Admit: 2022-05-16 | Discharge: 2022-05-16 | Disposition: A | Discharge status: Home / Self Care | Payer: 59 | Source: Ambulatory Visit | Attending: General Surgery | Admitting: General Surgery

## 2022-05-16 DIAGNOSIS — Z452 Encounter for adjustment and management of vascular access device: Secondary | ICD-10-CM | POA: Diagnosis present

## 2022-05-16 DIAGNOSIS — Z9221 Personal history of antineoplastic chemotherapy: Secondary | ICD-10-CM | POA: Diagnosis not present

## 2022-05-16 DIAGNOSIS — Z853 Personal history of malignant neoplasm of breast: Secondary | ICD-10-CM | POA: Insufficient documentation

## 2022-05-16 DIAGNOSIS — Z923 Personal history of irradiation: Secondary | ICD-10-CM | POA: Insufficient documentation

## 2022-05-16 DIAGNOSIS — Z01818 Encounter for other preprocedural examination: Secondary | ICD-10-CM

## 2022-05-16 HISTORY — DX: Malignant (primary) neoplasm, unspecified: C80.1

## 2022-05-16 HISTORY — PX: PORT-A-CATH REMOVAL: SHX5289

## 2022-05-16 SURGERY — REMOVAL PORT-A-CATH
Anesthesia: Monitor Anesthesia Care | Site: Chest | Laterality: Left

## 2022-05-16 MED ORDER — FENTANYL CITRATE (PF) 100 MCG/2ML IJ SOLN
INTRAMUSCULAR | Status: AC
  Filled 2022-05-16: qty 2

## 2022-05-16 MED ORDER — MIDAZOLAM HCL 2 MG/2ML IJ SOLN
INTRAMUSCULAR | Status: AC
  Filled 2022-05-16: qty 2

## 2022-05-16 MED ORDER — ONDANSETRON HCL 4 MG/2ML IJ SOLN
INTRAMUSCULAR | Status: DC | PRN
  Administered 2022-05-16: 4 mg via INTRAVENOUS

## 2022-05-16 MED ORDER — ACETAMINOPHEN 500 MG PO TABS
ORAL_TABLET | ORAL | Status: AC
  Filled 2022-05-16: qty 2

## 2022-05-16 MED ORDER — ATROPINE SULFATE 0.4 MG/ML IV SOLN
INTRAVENOUS | Status: AC
  Filled 2022-05-16: qty 1

## 2022-05-16 MED ORDER — PROPOFOL 500 MG/50ML IV EMUL
INTRAVENOUS | Status: AC
  Filled 2022-05-16: qty 50

## 2022-05-16 MED ORDER — LIDOCAINE HCL (CARDIAC) PF 100 MG/5ML IV SOSY
PREFILLED_SYRINGE | INTRAVENOUS | Status: DC | PRN
  Administered 2022-05-16: 50 mg via INTRAVENOUS

## 2022-05-16 MED ORDER — CEFAZOLIN SODIUM-DEXTROSE 2-4 GM/100ML-% IV SOLN
2.0000 g | INTRAVENOUS | Status: AC
  Administered 2022-05-16: 2 g via INTRAVENOUS

## 2022-05-16 MED ORDER — MIDAZOLAM HCL 5 MG/5ML IJ SOLN
INTRAMUSCULAR | Status: DC | PRN
  Administered 2022-05-16: 1 mg via INTRAVENOUS

## 2022-05-16 MED ORDER — OXYCODONE HCL 5 MG/5ML PO SOLN: 5.0000 mg | Freq: Once | ORAL | Status: DC | PRN

## 2022-05-16 MED ORDER — ONDANSETRON HCL 4 MG/2ML IJ SOLN: 4.0000 mg | Freq: Once | INTRAMUSCULAR | Status: DC | PRN

## 2022-05-16 MED ORDER — LIDOCAINE-EPINEPHRINE (PF) 1 %-1:200000 IJ SOLN
INTRAMUSCULAR | Status: AC
  Filled 2022-05-16: qty 30

## 2022-05-16 MED ORDER — ONDANSETRON HCL 4 MG/2ML IJ SOLN
INTRAMUSCULAR | Status: AC
  Filled 2022-05-16: qty 2

## 2022-05-16 MED ORDER — PROPOFOL 500 MG/50ML IV EMUL
INTRAVENOUS | Status: DC | PRN
  Administered 2022-05-16: 125 ug/kg/min via INTRAVENOUS

## 2022-05-16 MED ORDER — BUPIVACAINE HCL (PF) 0.25 % IJ SOLN
INTRAMUSCULAR | Status: AC
  Filled 2022-05-16: qty 30

## 2022-05-16 MED ORDER — FENTANYL CITRATE (PF) 100 MCG/2ML IJ SOLN
INTRAMUSCULAR | Status: DC | PRN
  Administered 2022-05-16: 25 ug via INTRAVENOUS

## 2022-05-16 MED ORDER — BUPIVACAINE-EPINEPHRINE (PF) 0.5% -1:200000 IJ SOLN
INTRAMUSCULAR | Status: AC
  Filled 2022-05-16: qty 30

## 2022-05-16 MED ORDER — LACTATED RINGERS IV SOLN: INTRAVENOUS | Status: DC

## 2022-05-16 MED ORDER — ACETAMINOPHEN 500 MG PO TABS
1000.0000 mg | ORAL_TABLET | ORAL | Status: AC
  Administered 2022-05-16: 1000 mg via ORAL

## 2022-05-16 MED ORDER — PHENYLEPHRINE 80 MCG/ML (10ML) SYRINGE FOR IV PUSH (FOR BLOOD PRESSURE SUPPORT)
PREFILLED_SYRINGE | INTRAVENOUS | Status: AC
  Filled 2022-05-16: qty 10

## 2022-05-16 MED ORDER — EPHEDRINE 5 MG/ML INJ
INTRAVENOUS | Status: AC
  Filled 2022-05-16: qty 5

## 2022-05-16 MED ORDER — LIDOCAINE 2% (20 MG/ML) 5 ML SYRINGE
INTRAMUSCULAR | Status: AC
  Filled 2022-05-16: qty 5

## 2022-05-16 MED ORDER — CEFAZOLIN SODIUM-DEXTROSE 2-4 GM/100ML-% IV SOLN
INTRAVENOUS | Status: AC
  Filled 2022-05-16: qty 100

## 2022-05-16 MED ORDER — SUCCINYLCHOLINE CHLORIDE 200 MG/10ML IV SOSY
PREFILLED_SYRINGE | INTRAVENOUS | Status: AC
  Filled 2022-05-16: qty 10

## 2022-05-16 MED ORDER — OXYCODONE HCL 5 MG PO TABS: 5.0000 mg | ORAL_TABLET | Freq: Once | ORAL | Status: DC | PRN

## 2022-05-16 MED ORDER — LIDOCAINE-EPINEPHRINE (PF) 1 %-1:200000 IJ SOLN
INTRAMUSCULAR | Status: DC | PRN
  Administered 2022-05-16: 10 mL

## 2022-05-16 MED ORDER — FENTANYL CITRATE (PF) 100 MCG/2ML IJ SOLN: 25.0000 ug | INTRAMUSCULAR | Status: DC | PRN

## 2022-05-16 MED ORDER — CHLORHEXIDINE GLUCONATE CLOTH 2 % EX PADS: 6.0000 | MEDICATED_PAD | Freq: Once | CUTANEOUS | Status: DC

## 2022-05-16 SURGICAL SUPPLY — 37 items
ADH SKN CLS APL DERMABOND .7 (GAUZE/BANDAGES/DRESSINGS) ×1
APL PRP STRL LF DISP 70% ISPRP (MISCELLANEOUS) ×1
BLADE HEX COATED 2.75 (ELECTRODE) ×2 IMPLANT
BLADE SURG 15 STRL LF DISP TIS (BLADE) ×2 IMPLANT
BLADE SURG 15 STRL SS (BLADE) ×1
CANISTER SUCT 1200ML W/VALVE (MISCELLANEOUS) IMPLANT
CHLORAPREP W/TINT 26 (MISCELLANEOUS) ×2 IMPLANT
COVER BACK TABLE 60X90IN (DRAPES) ×2 IMPLANT
COVER MAYO STAND STRL (DRAPES) ×2 IMPLANT
DERMABOND ADVANCED .7 DNX12 (GAUZE/BANDAGES/DRESSINGS) ×2 IMPLANT
DRAPE LAPAROTOMY 100X72 PEDS (DRAPES) ×2 IMPLANT
DRAPE UTILITY XL STRL (DRAPES) ×2 IMPLANT
ELECT REM PT RETURN 9FT ADLT (ELECTROSURGICAL) ×1
ELECTRODE REM PT RTRN 9FT ADLT (ELECTROSURGICAL) ×2 IMPLANT
GLOVE BIO SURGEON STRL SZ 6 (GLOVE) ×2 IMPLANT
GLOVE BIO SURGEON STRL SZ8 (GLOVE) IMPLANT
GLOVE BIOGEL PI IND STRL 6.5 (GLOVE) ×2 IMPLANT
GLOVE BIOGEL PI IND STRL 7.0 (GLOVE) IMPLANT
GLOVE BIOGEL PI IND STRL 8.5 (GLOVE) IMPLANT
GOWN STRL REUS W/ TWL LRG LVL3 (GOWN DISPOSABLE) ×2 IMPLANT
GOWN STRL REUS W/ TWL XL LVL3 (GOWN DISPOSABLE) IMPLANT
GOWN STRL REUS W/TWL 2XL LVL3 (GOWN DISPOSABLE) ×2 IMPLANT
GOWN STRL REUS W/TWL LRG LVL3 (GOWN DISPOSABLE)
GOWN STRL REUS W/TWL XL LVL3 (GOWN DISPOSABLE) ×1
NDL HYPO 25X1 1.5 SAFETY (NEEDLE) ×2 IMPLANT
NEEDLE HYPO 25X1 1.5 SAFETY (NEEDLE) ×1 IMPLANT
NS IRRIG 1000ML POUR BTL (IV SOLUTION) IMPLANT
PACK BASIN DAY SURGERY FS (CUSTOM PROCEDURE TRAY) ×2 IMPLANT
PENCIL SMOKE EVACUATOR (MISCELLANEOUS) ×2 IMPLANT
SPIKE FLUID TRANSFER (MISCELLANEOUS) IMPLANT
SUT MNCRL AB 4-0 PS2 18 (SUTURE) ×2 IMPLANT
SUT VIC AB 3-0 SH 27 (SUTURE) ×1
SUT VIC AB 3-0 SH 27X BRD (SUTURE) ×2 IMPLANT
SYR CONTROL 10ML LL (SYRINGE) ×2 IMPLANT
TOWEL GREEN STERILE FF (TOWEL DISPOSABLE) ×2 IMPLANT
TUBE CONNECTING 20X1/4 (TUBING) IMPLANT
YANKAUER SUCT BULB TIP NO VENT (SUCTIONS) IMPLANT

## 2022-05-16 NOTE — Op Note (Signed)
  PRE-OPERATIVE DIAGNOSIS:  un-needed Port-A-Cath for right breast cancer  POST-OPERATIVE DIAGNOSIS:  Same   PROCEDURE:  Procedure(s):  REMOVAL PORT-A-CATH  SURGEON:  Surgeon(s):  Mateja Dier, MD  ANESTHESIA:   MAC + local  EBL:   Minimal  SPECIMEN:  None  Complications : none known  Procedure:   Pt was  identified in the holding area and taken to the operating room where she was placed supine on the operating room table.  MAC anesthesia was induced.  The left upper chest was prepped and draped.  The prior incision was anesthetized with local anesthetic.  The incision was opened with a #15 blade.  The subcutaneous tissue was divided with the cautery.  The port was identified and the capsule opened.  The four 2-0 prolene sutures were removed.  The port was then removed and pressure held on the tract.  The catheter appeared intact without evidence of breakage, length was 22 cm.  The wound was inspected for hemostasis, which was achieved with cautery.  The wound was closed with 3-0 vicryl deep dermal interrupted sutures and 4-0 Monocryl running subcuticular suture.  The wound was cleaned, dried, and dressed with dermabond.  The patient was awakened from anesthesia and taken to the PACU in stable condition.  Needle, sponge, and instrument counts are correct.     

## 2022-05-16 NOTE — H&P (Signed)
Susan Reid is an 57 y.o. female.   Chief Complaint: port in place HPI:  Pt is s/p tx for right breast cancer.  Desires port removal  Past Medical History:  Diagnosis Date   Allergy    Cancer ([place])    Family history of adverse reaction to anesthesia    Mother got severe N/V and pneumonia following anesthesia   History of radiation therapy    Right breast- 08/16/21-09/13/21- Dr. Gery Pray   Osteopenia     Past Surgical History:  Procedure Laterality Date   AXILLARY SENTINEL NODE BIOPSY Right 04/20/2021   Procedure: RIGHT AXILLARY SENTINEL NODE BIOPSY;  Surgeon: Stark Klein, MD;  Location: [facility];  Service: General;  Laterality: Right;   BREAST BIOPSY Right 02/25/2020   [place]   BREAST BIOPSY Right 03/14/2021   BREAST LUMPECTOMY Right 02/2021   BREAST LUMPECTOMY WITH SENTINEL LYMPH NODE BIOPSY Right 04/20/2021   Procedure: RIGHT BREAST LUMPECTOMY;  Surgeon: Stark Klein, MD;  Location: [facility];  Service: General;  Laterality: Right;   PORTACATH PLACEMENT Left 04/20/2021   Procedure: INSERTION PORT-A-CATH;  Surgeon: Stark Klein, MD;  Location: [facility];  Service: General;  Laterality: Left;   WISDOM TOOTH EXTRACTION  04/24/1987    Family History  Problem Relation Age of Onset   Arthritis Mother    Hyperlipidemia Mother    Heart disease Mother    Stroke Mother 58   Hypertension Mother    Kidney disease Mother    Breast cancer Mother 50   Cirrhosis Mother    Mental illness Father    Depression Father    Lung cancer Maternal Aunt 27   Lung cancer Maternal Uncle 26   Cancer Maternal Grandfather        Oral  - smoker   Lung cancer Maternal Grandfather    Cancer Paternal Grandmother        unknown type   Prostate cancer Paternal Grandfather    Breast cancer Maternal Great-grandmother    Colon cancer Neg Hx    Colon polyps Neg Hx    Esophageal cancer Neg Hx    Rectal cancer Neg Hx    Stomach cancer Neg Hx    Social History:  reports that she has never smoked. She has  never used smokeless tobacco. She reports current alcohol use. She reports that she does not use drugs.  Allergies:  Allergies  Allergen Reactions   Nitrates, Organic Anaphylaxis    Throat closes    Medications Prior to Admission  Medication Sig Dispense Refill   Ascorbic Acid (VITAMIN C PO) Take 500 mg by mouth daily.     cholecalciferol (VITAMIN D) 1000 UNITS tablet Take 1,000 Units by mouth daily.     fluticasone (FLONASE) 50 MCG/ACT nasal spray Place 1 spray into both nostrils daily as needed for allergies or rhinitis.     letrozole (FEMARA) 2.5 MG tablet Take 1 tablet (2.5 mg total) by mouth daily. 90 tablet 3   loratadine (CLARITIN) 10 MG tablet Take 10 mg by mouth daily as needed for allergies.     Multiple Vitamin (MULTIVITAMIN) tablet Take 1 tablet by mouth daily.      No results found for this or any previous visit (from the past 48 hour(s)). No results found.  Review of Systems  All other systems reviewed and are negative.   Blood pressure 133/82, pulse 82, temperature 98.3 F (36.8 C), temperature source Oral, resp. rate 14, height '5\' 5"'$  (1.651 m), weight 53.9 kg,  last menstrual period 12/20/2017, SpO2 100 %. Physical Exam Vitals reviewed.  Constitutional:      General: She is not in acute distress.    Appearance: Normal appearance.  HENT:     Head: Normocephalic and atraumatic.  Eyes:     General: No scleral icterus.    Pupils: Pupils are equal, round, and reactive to light.  Cardiovascular:     Rate and Rhythm: Normal rate.  Pulmonary:     Effort: Pulmonary effort is normal.  Skin:    General: Skin is warm and dry.     Capillary Refill: Capillary refill takes 2 to 3 seconds.  Neurological:     General: No focal deficit present.     Mental Status: She is alert and oriented to person, place, and time.      Assessment/Plan Port in place.  Plan port removal. Reviewed surgery.   Stark Klein, MD 05/16/2022, 8:59 AM

## 2022-05-16 NOTE — Discharge Instructions (Addendum)
[facility] Office Phone Number [phone]   POST OP INSTRUCTIONS  Always review your discharge instruction sheet given to you by the facility where your surgery was performed.  IF YOU HAVE DISABILITY OR FAMILY LEAVE FORMS, YOU MUST BRING THEM TO THE OFFICE FOR PROCESSING.  DO NOT GIVE THEM TO YOUR DOCTOR.  Take 2 tylenol (acetominophen) three times a day for 3 days.  If you still have pain, add ibuprofen with food in between if able to take this (if you have kidney issues or stomach issues, do not take ibuprofen).  If both of those are not enough, add the narcotic pain pill.  If you find you are needing a lot of this overnight after surgery, call the next morning for a refill.   Take your usually prescribed medications unless otherwise directed If you need a refill on your pain medication, please contact your pharmacy.  They will contact our office to request authorization.  Prescriptions will not be filled after 5pm or on week-ends. You should eat very light the first 24 hours after surgery, such as soup, crackers, pudding, etc.  Resume your normal diet the day after surgery It is common to experience some constipation if taking pain medication after surgery.  Increasing fluid intake and taking a stool softener will usually help or prevent this problem from occurring.  A mild laxative (Milk of Magnesia or Miralax) should be taken according to package directions if there are no bowel movements after 48 hours. You may shower in 48 hours.  The surgical glue will flake off in 2-3 weeks.   ACTIVITIES:  No strenuous activity or heavy lifting for 1 week.   You may drive when you no longer are taking prescription pain medication, you can comfortably wear a seatbelt, and you can safely maneuver your car and apply brakes. RETURN TO WORK:  __________as tolerated if no lifting for 1 week_______________ You should see your doctor in the office for a follow-up appointment approximately  three-four weeks after your surgery.    WHEN TO CALL YOUR DOCTOR: Fever over 101.0 Nausea and/or vomiting. Extreme swelling or bruising. Continued bleeding from incision. Increased pain, redness, or drainage from the incision.  The clinic staff is available to answer your questions during regular business hours.  Please don't hesitate to call and ask to speak to one of the nurses for clinical concerns.  If you have a medical emergency, go to the nearest emergency room or call 911.  A surgeon from [facility] is always on call at the hospital.  For further questions, please visit [email or web address]    May take Tylenol after 1:40pm, if needed.   Post Anesthesia Home Care Instructions  Activity: Get plenty of rest for the remainder of the day. A responsible individual must stay with you for 24 hours following the procedure.  For the next 24 hours, DO NOT: -Drive a car -Paediatric nurse -Drink alcoholic beverages -Take any medication unless instructed by your physician -Make any legal decisions or sign important papers.  Meals: Start with liquid foods such as gelatin or soup. Progress to regular foods as tolerated. Avoid greasy, spicy, heavy foods. If nausea and/or vomiting occur, drink only clear liquids until the nausea and/or vomiting subsides. Call your physician if vomiting continues.  Special Instructions/Symptoms: Your throat may feel dry or sore from the anesthesia or the breathing tube placed in your throat during surgery. If this causes discomfort, gargle with warm salt water. The discomfort should disappear within 24  hours.  If you had a scopolamine patch placed behind your ear for the management of post- operative nausea and/or vomiting:  1. The medication in the patch is effective for 72 hours, after which it should be removed.  Wrap patch in a tissue and discard in the trash. Wash hands thoroughly with soap and water. 2. You may remove the patch  earlier than 72 hours if you experience unpleasant side effects which may include dry mouth, dizziness or visual disturbances. 3. Avoid touching the patch. Wash your hands with soap and water after contact with the patch.

## 2022-05-16 NOTE — Anesthesia Postprocedure Evaluation (Signed)
Anesthesia Post Note  Patient: Susan Reid  Procedure(s) Performed: REMOVAL PORT-A-CATH (Left: Chest)     Patient location during evaluation: PACU Anesthesia Type: MAC Level of consciousness: awake and alert and oriented Pain management: pain level controlled Vital Signs Assessment: post-procedure vital signs reviewed and stable Respiratory status: spontaneous breathing, nonlabored ventilation and respiratory function stable Cardiovascular status: stable and blood pressure returned to baseline Postop Assessment: no apparent nausea or vomiting Anesthetic complications: no   No notable events documented.  Last Vitals:  Vitals:   05/16/22 0948 05/16/22 0956  BP: (!) 97/58 111/68  Pulse: 91 84  Resp: 14 13  Temp: (!) 36.3 C   SpO2: 96% 95%    Last Pain:  Vitals:   05/16/22 0956  TempSrc:   PainSc: 0-No pain                 Zakirah Weingart A.

## 2022-05-16 NOTE — Transfer of Care (Signed)
Immediate Anesthesia Transfer of Care Note  Patient: Susan Reid  Procedure(s) Performed: REMOVAL PORT-A-CATH (Left: Chest)  Patient Location: PACU  Anesthesia Type:MAC  Level of Consciousness: awake, alert , and oriented  Airway & Oxygen Therapy: Patient Spontanous Breathing and Patient connected to face mask oxygen  Post-op Assessment: Report given to RN and Post -op Vital signs reviewed and stable  Post vital signs: Reviewed and stable  Last Vitals:  Vitals Value Taken Time  BP    Temp    Pulse    Resp    SpO2      Last Pain:  Vitals:   05/16/22 0734  TempSrc: Oral  PainSc: 0-No pain      Patients Stated Pain Goal: 7 (01/77/93 9030)  Complications: No notable events documented.

## 2022-05-17 ENCOUNTER — Encounter (HOSPITAL_BASED_OUTPATIENT_CLINIC_OR_DEPARTMENT_OTHER): Payer: Self-pay | Admitting: General Surgery

## 2022-05-22 ENCOUNTER — Encounter: Payer: Self-pay | Admitting: Hematology and Oncology

## 2022-05-23 ENCOUNTER — Other Ambulatory Visit: Payer: Self-pay

## 2022-06-03 ENCOUNTER — Other Ambulatory Visit: Payer: Self-pay

## 2022-06-06 LAB — SIGNATERA ONLY (NATERA MANAGED)
SIGNATERA MTM READOUT: 0 MTM/ml
SIGNATERA TEST RESULT: NEGATIVE

## 2022-06-07 ENCOUNTER — Encounter: Payer: Self-pay | Admitting: Hematology and Oncology

## 2022-06-11 ENCOUNTER — Encounter (HOSPITAL_COMMUNITY): Payer: Self-pay

## 2022-07-16 ENCOUNTER — Ambulatory Visit: Payer: 59 | Attending: Hematology and Oncology

## 2022-07-16 VITALS — Wt 121.1 lb

## 2022-07-16 DIAGNOSIS — Z483 Aftercare following surgery for neoplasm: Secondary | ICD-10-CM | POA: Insufficient documentation

## 2022-07-16 NOTE — Therapy (Signed)
OUTPATIENT PHYSICAL THERAPY SOZO SCREENING NOTE   Patient Name: Susan Reid MRN: [id number] DOB:[birth year], 57 y.o., female Today's Date: 07/16/2022  PCP: Patient, No Pcp Per REFERRING PROVIDER: [name], MD   PT End of Session - 07/16/22 1553     Visit Number 11   # unchanged due to screen only   PT Start Time 1551    PT Stop Time 1555    PT Time Calculation (min) 4 min    Activity Tolerance Patient tolerated treatment well    Behavior During Therapy WFL for tasks assessed/performed             Past Medical History:  Diagnosis Date   Allergy    Cancer ([place])    Family history of adverse reaction to anesthesia    Mother got severe N/V and pneumonia following anesthesia   History of radiation therapy    Right breast- 08/16/21-09/13/21- Dr. Gery Pray   Osteopenia    Past Surgical History:  Procedure Laterality Date   AXILLARY SENTINEL NODE BIOPSY Right 04/20/2021   Procedure: RIGHT AXILLARY SENTINEL NODE BIOPSY;  Surgeon: Stark Klein, MD;  Location: [facility];  Service: General;  Laterality: Right;   BREAST BIOPSY Right 02/25/2020   [place]   BREAST BIOPSY Right 03/14/2021   BREAST LUMPECTOMY Right 02/2021   BREAST LUMPECTOMY WITH SENTINEL LYMPH NODE BIOPSY Right 04/20/2021   Procedure: RIGHT BREAST LUMPECTOMY;  Surgeon: Stark Klein, MD;  Location: [facility];  Service: General;  Laterality: Right;   PORT-A-CATH REMOVAL Left 05/16/2022   Procedure: REMOVAL PORT-A-CATH;  Surgeon: Stark Klein, MD;  Location: [facility];  Service: General;  Laterality: Left;   PORTACATH PLACEMENT Left 04/20/2021   Procedure: INSERTION PORT-A-CATH;  Surgeon: Stark Klein, MD;  Location: [facility];  Service: General;  Laterality: Left;   [place] EXTRACTION  04/24/1987   Patient Active Problem List   Diagnosis Date Noted   Port-A-Cath in place 05/18/2021   Genetic testing 04/03/2021   Family history of breast cancer 03/23/2021   Malignant neoplasm of upper-outer  quadrant of right breast in female, estrogen receptor positive ([place]) 03/20/2021   Osteoporosis 12/10/2018    REFERRING DIAG: right breast cancer at risk for lymphedema  THERAPY DIAG: Aftercare following surgery for neoplasm  PERTINENT HISTORY: Patient was diagnosed on 03/07/2021 with 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 She had a right lumpectomy and senitnel node biopsy (5 negative nodes) on 04/20/2021. It is ER positive, PR negative, and HER2 positive with a Ki67 of 80%.   PRECAUTIONS: right UE Lymphedema risk, None  SUBJECTIVE: Pt returns for her 3 month L-Dex screen.   PAIN:  Are you having pain? No  SOZO SCREENING: Patient was assessed today using the SOZO machine to determine the lymphedema index score. This was compared to her baseline score. It was determined that she is within the recommended range when compared to her baseline and no further action is needed at this time. She will continue SOZO screenings. These are done every 3 months for 2 years post operatively followed by every 6 months for 2 years, and then annually.   L-DEX FLOWSHEETS - 07/16/22 1500       L-DEX LYMPHEDEMA SCREENING   Measurement Type Unilateral    L-DEX MEASUREMENT EXTREMITY Upper Extremity    POSITION  Standing    DOMINANT SIDE Right    At Risk Side Right    BASELINE SCORE (UNILATERAL) 2.8    L-DEX SCORE (UNILATERAL) -1.5  VALUE CHANGE (UNILAT) -4.3               Otelia Limes, PTA 07/16/2022, 3:55 PM

## 2022-08-20 LAB — SIGNATERA
SIGNATERA MTM READOUT: 0 MTM/ml
SIGNATERA TEST RESULT: NEGATIVE

## 2022-08-24 ENCOUNTER — Telehealth: Payer: Self-pay

## 2022-08-24 NOTE — Telephone Encounter (Signed)
Called pt per MD to advise [facility] testing was negative/not detected. Pt verbalized understanding of results and knows [facility] will be in touch to schedule 3 mo repeat lab.   

## 2022-09-10 ENCOUNTER — Ambulatory Visit (HOSPITAL_COMMUNITY)
Admission: RE | Admit: 2022-09-10 | Discharge: 2022-09-10 | Disposition: A | Discharge status: Home / Self Care | Payer: 59 | Source: Ambulatory Visit | Attending: Family Medicine | Admitting: Family Medicine

## 2022-09-10 ENCOUNTER — Other Ambulatory Visit: Payer: Self-pay

## 2022-09-10 ENCOUNTER — Encounter (HOSPITAL_COMMUNITY): Payer: Self-pay

## 2022-09-10 VITALS — BP 125/76 | HR 79 | Temp 97.9°F | Resp 20

## 2022-09-10 DIAGNOSIS — L255 Unspecified contact dermatitis due to plants, except food: Secondary | ICD-10-CM

## 2022-09-10 MED ORDER — CEPHALEXIN 500 MG PO CAPS: 500.0000 mg | ORAL_CAPSULE | Freq: Two times a day (BID) | ORAL | 0 refills | Status: DC

## 2022-09-10 MED ORDER — PREDNISONE 10 MG (48) PO TBPK: ORAL_TABLET | ORAL | 0 refills | Status: DC

## 2022-09-10 NOTE — ED Provider Notes (Signed)
[facility]   [id number] 09/10/22 Arrival Time: 1309  ASSESSMENT & PLAN:  1. Rhus dermatitis     Meds ordered this encounter  Medications   predniSONE (STERAPRED UNI-PAK 48 TAB) 10 MG (48) TBPK tablet    Sig: Take as directed.    Dispense:  48 tablet    Refill:  0   cephALEXin (KEFLEX) 500 MG capsule    Sig: Take 1 capsule (500 mg total) by mouth 2 (two) times daily.    Dispense:  20 capsule    Refill:  0   No signs of active skin infection. She will hold Rx Keflex. Familiar with s/s of skin infection; will take as needed. Start prednisone today.   Will follow up with PCP or here if worsening or failing to improve as anticipated. Reviewed expectations re: course of current medical issues. Questions answered. Outlined signs and symptoms indicating need for more acute intervention. Patient verbalized understanding. After Visit Summary given.   SUBJECTIVE:  Susan Reid is a 57 y.o. female who presents with a skin complaint. Rash to left forearm.  Used hydrocortisone cream (but was out in the sun)   Patient reports having poison ivy on right forearm on April 8.  Patient thinks this looks just like the episode in April.    Has also tried benadryl without much relief.   OBJECTIVE: Vitals:   09/10/22 1335  BP: 125/76  Pulse: 79  Resp: 20  Temp: 97.9 F (36.6 C)  TempSrc: Oral  SpO2: 98%    General appearance: alert; no distress Extremities: no edema; moves all extremities normally Skin: warm and dry; patches of erythematous papules and vesicles with surrounding erythema over L forearm extending above elbow; no oozing/drainage/bleeding; is hot to touch; no overlying crusting Psychological: alert and cooperative; normal mood and affect  Allergies  Allergen Reactions   Nitrates, Organic Anaphylaxis    Throat closes    Past Medical History:  Diagnosis Date   Allergy    Cancer ([facility])    Family history of adverse reaction to anesthesia    Mother got  severe N/V and pneumonia following anesthesia   History of radiation therapy    Right breast- 08/16/21-09/13/21- Dr. Antony Blackbird   Osteopenia    Social History   Socioeconomic History   Marital status: Married    Spouse name: Roe Coombs   Number of children: 2   Years of education: High school   Highest education level: Not on file  Occupational History   Not on file  Tobacco Use   Smoking status: Never   Smokeless tobacco: Never  Vaping Use   Vaping Use: Never used  Substance and Sexual Activity   Alcohol use: Not Currently    Comment: one a month or less   Drug use: No   Sexual activity: Yes    Birth control/protection: Post-menopausal  Other Topics Concern   Not on file  Social History Narrative   10/28/19   From: the area   Living: with Husband, Don   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      Family: 2 children - Pharmacist, community (nurse at [employer]) and Whitney Post (family medicine resident in [place]) - both married 2020 and 2021      Enjoys: work outside in the yard, hiking      Exercise: walking daily, was going to the gym   Diet: diabetic diet - avoiding added sugar      Safety   Seat belts:  Yes    Guns: Yes  and secure   Safe in relationships: Yes    Social Determinants of Health   Financial Resource Strain: Not on file  Food Insecurity: Not on file  Transportation Needs: Not on file  Physical Activity: Not on file  Stress: Not on file  Social Connections: Not on file  Intimate Partner Violence: Not on file   Family History  Problem Relation Age of Onset   Arthritis Mother    Hyperlipidemia Mother    Heart disease Mother    Stroke Mother 31   Hypertension Mother    Kidney disease Mother    Breast cancer Mother 44   Cirrhosis Mother    Mental illness Father    Depression Father    Lung cancer Maternal Aunt 35   Lung cancer Maternal Uncle 65   Cancer Maternal Grandfather        Oral  - smoker   Lung cancer Maternal Grandfather    Cancer  Paternal Grandmother        unknown type   Prostate cancer Paternal Grandfather    Breast cancer Maternal Great-grandmother    Colon cancer Neg Hx    Colon polyps Neg Hx    Esophageal cancer Neg Hx    Rectal cancer Neg Hx    Stomach cancer Neg Hx    Past Surgical History:  Procedure Laterality Date   AXILLARY SENTINEL NODE BIOPSY Right 04/20/2021   Procedure: RIGHT AXILLARY SENTINEL NODE BIOPSY;  Surgeon: Almond Lint, MD;  Location: [facility];  Service: General;  Laterality: Right;   BREAST BIOPSY Right 02/25/2020   [facility]   BREAST BIOPSY Right 03/14/2021   BREAST LUMPECTOMY Right 02/2021   BREAST LUMPECTOMY WITH SENTINEL LYMPH NODE BIOPSY Right 04/20/2021   Procedure: RIGHT BREAST LUMPECTOMY;  Surgeon: Almond Lint, MD;  Location: [facility];  Service: General;  Laterality: Right;   PORT-A-CATH REMOVAL Left 05/16/2022   Procedure: REMOVAL PORT-A-CATH;  Surgeon: Almond Lint, MD;  Location: [facility];  Service: General;  Laterality: Left;   PORTACATH PLACEMENT Left 04/20/2021   Procedure: INSERTION PORT-A-CATH;  Surgeon: Almond Lint, MD;  Location: [facility];  Service: General;  Laterality: Left;   WISDOM TOOTH EXTRACTION  04/24/1987      Mardella Layman, MD 09/10/22 1412

## 2022-09-10 NOTE — ED Triage Notes (Addendum)
Rash to left forearm.  Used hydrocortisone cream (but was out in the sun)   Patient reports having poison ivy on right forearm on April 8.  Patient thinks this looks just like the episode in April.    Has also tried benadryl.    Left forearm looks swollen, very red, looks like redness is spreading up left arm .  Redness extends to above left elbow

## 2022-09-13 ENCOUNTER — Other Ambulatory Visit: Payer: Self-pay | Admitting: Hematology and Oncology

## 2022-09-14 ENCOUNTER — Encounter: Payer: Self-pay | Admitting: Hematology and Oncology

## 2022-09-30 IMAGING — MR MR BREAST BILAT WO/W CM
8 of 12 series · 33 of 48 positions shown · IV contrast (5ml gadavist)
Comparison: 02/15/2020 MR. Prior mammograms and ultrasounds

CLINICAL DATA: 55-year-old female for newly diagnosed RIGHT breast
cancer.

EXAM:
BILATERAL BREAST MRI WITH AND WITHOUT CONTRAST
TECHNIQUE: Multiplanar, multisequence MR images of both breasts were obtained
prior to and following the intravenous administration of 5 ml of
Gadavist

[Series 2: t2_tirm_tra ipat (a-p) · axial · 3.0mm · 0.70mm/px · 1 of 61 slices shown]
[im 1/61]
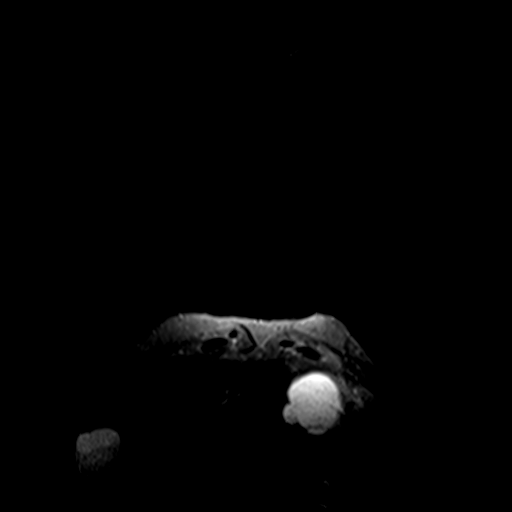

[Series 3: fl3d pre-cm no · axial · non-contrast · 1.2mm · 0.94mm/px · z∈[-98,+93]mm · 5 of 160 slices shown]
[im 1/160]
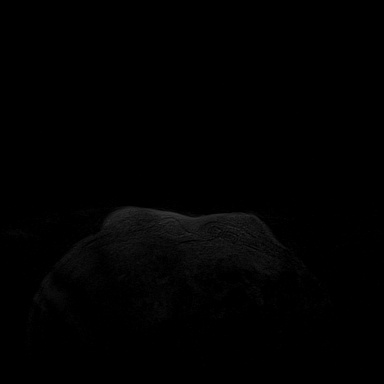
[im 40/160]
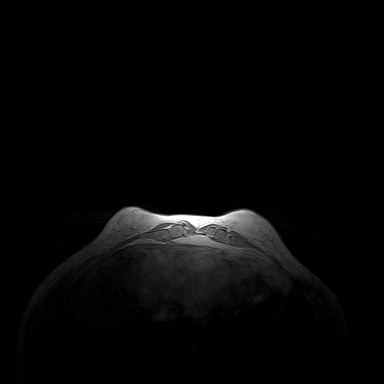
[im 80/160]
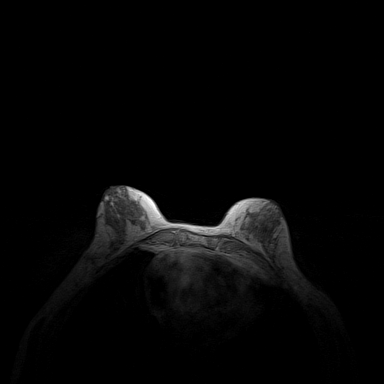
[im 120/160]
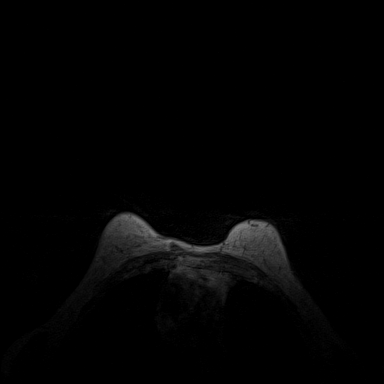
[im 160/160]
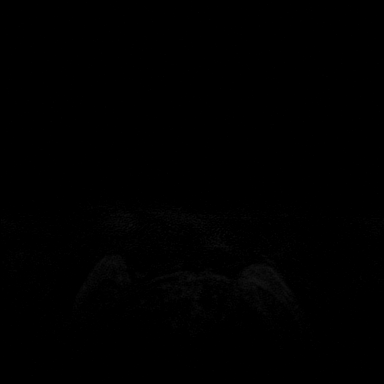

[Series 4: fl3d pre-cm · axial · non-contrast · 1.2mm · 0.94mm/px · z∈[-98,+93]mm · 5 of 160 slices shown]
[im 1/160]
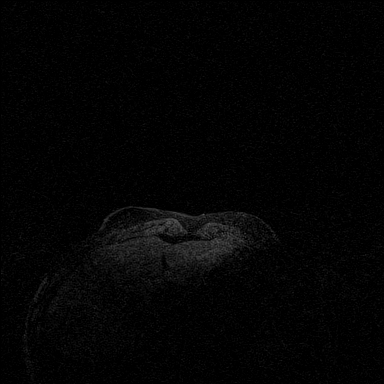
[im 40/160]
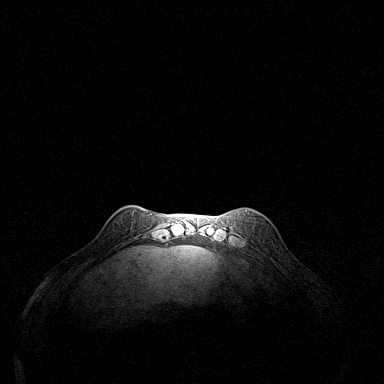
[im 80/160]
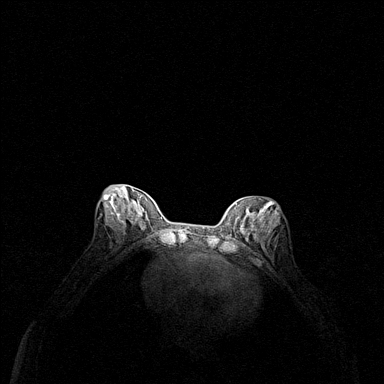
[im 120/160]
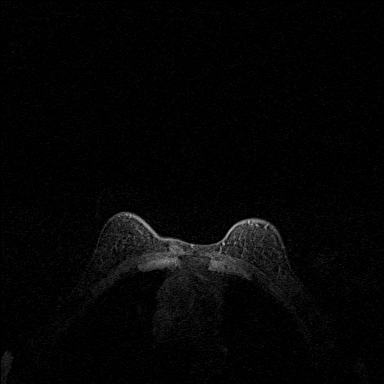
[im 160/160]
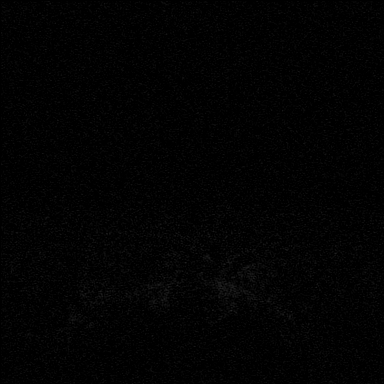

[Series 5: fl3d post-cm 20 · axial · 1.2mm · 0.94mm/px · z∈[-98,+93]mm · 5 of 160 slices shown (1 of 3)]
[im 1/160]
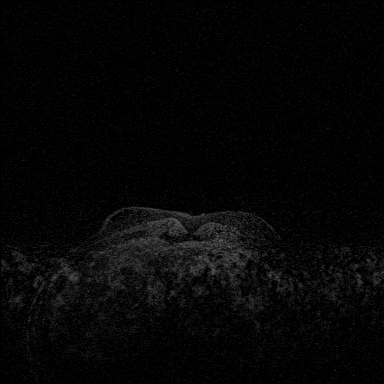
[im 40/160]
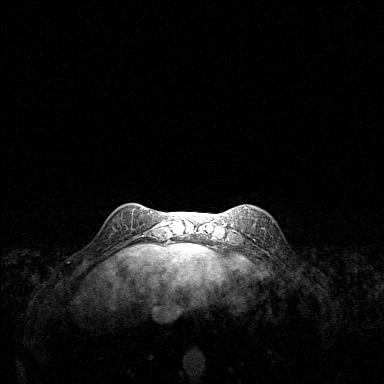
[im 80/160]
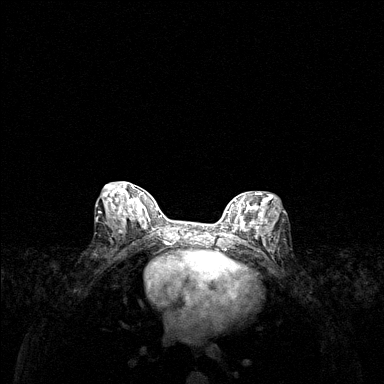
[im 120/160]
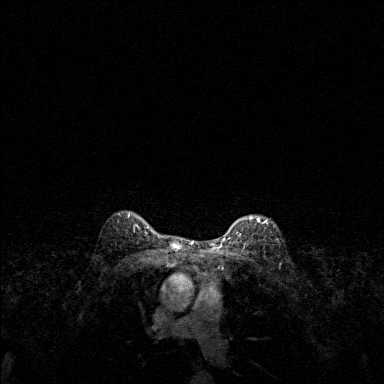
[im 160/160]
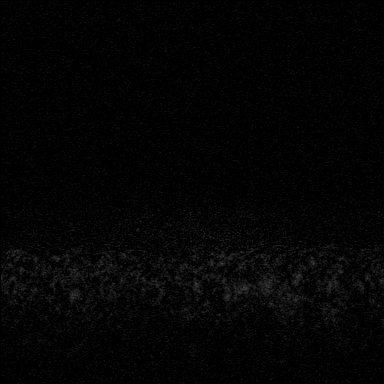

[Series 6: fl3d post-cm 20 · axial · 1.2mm · 0.94mm/px · z∈[-98,+93]mm · 5 of 160 slices shown (2 of 3)]
[im 1/160]
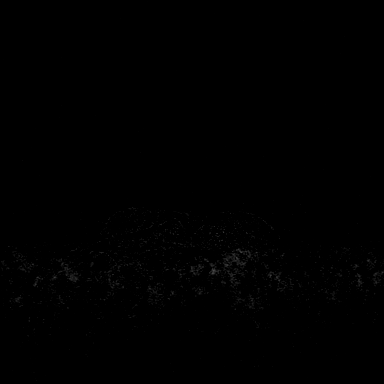
[im 40/160]
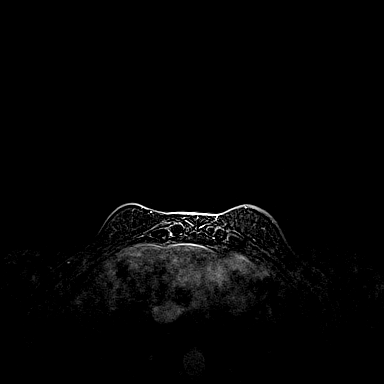
[im 80/160]
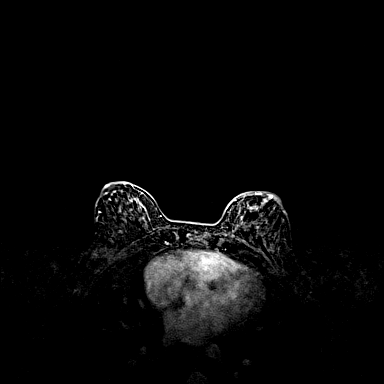
[im 120/160]
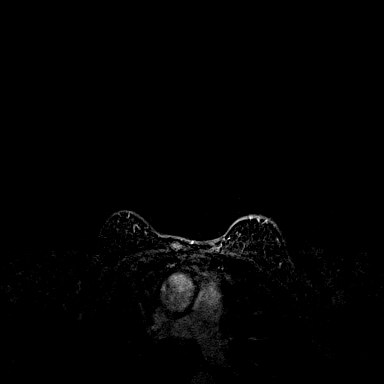
[im 160/160]
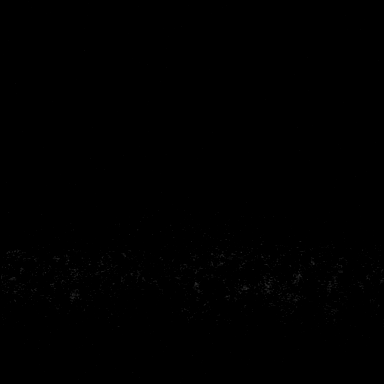

[Series 7: fl3d post-cm 20 · axial · 192.0mm · 0.94mm/px · 1 of 1 slices shown (3 of 3)]
[im 1/1]
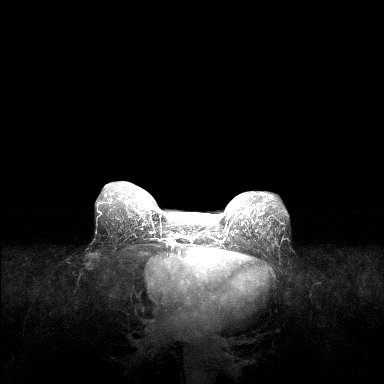

[Series 8: fl3d post-cm 3min · axial · 1.2mm · 0.94mm/px · z∈[-98,+93]mm · 6 of 160 slices shown]
[im 1/160]
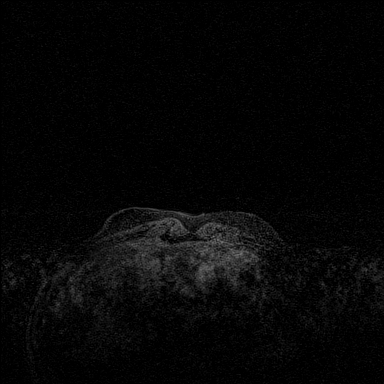
[im 32/160]
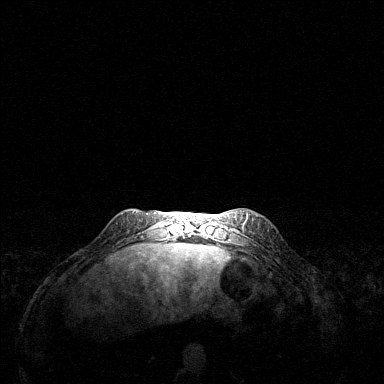
[im 64/160]
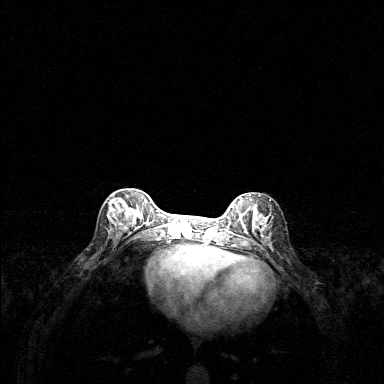
[im 96/160]
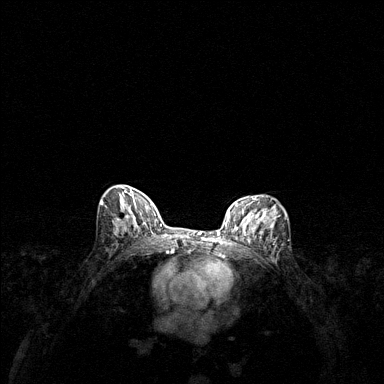
[im 128/160]
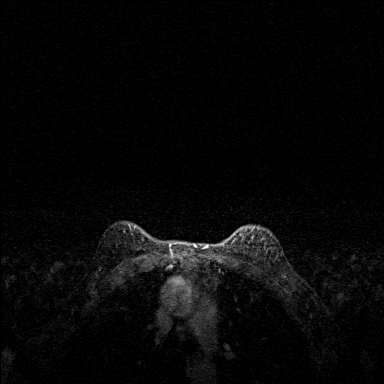
[im 160/160]
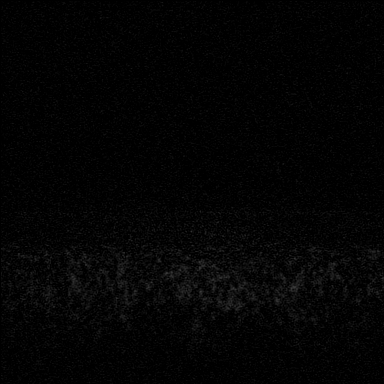

[Series 9: fl3d post-cm 3min_sub · axial · 1.2mm · 0.94mm/px · z∈[-98,+54]mm · 5 of 160 slices shown]
[im 1/160]
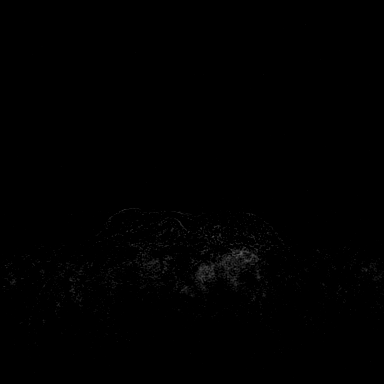
[im 32/160]
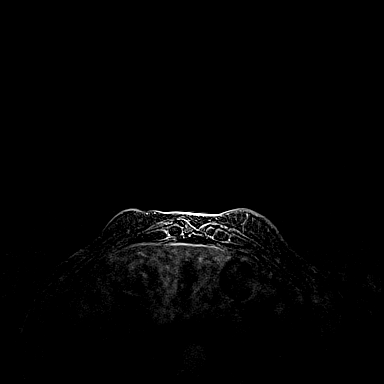
[im 64/160]
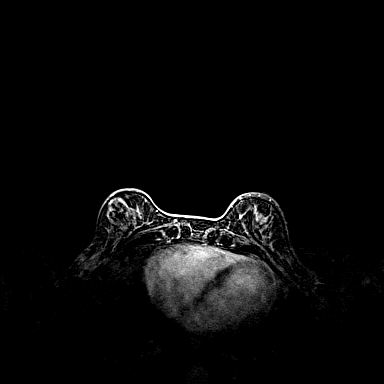
[im 96/160]
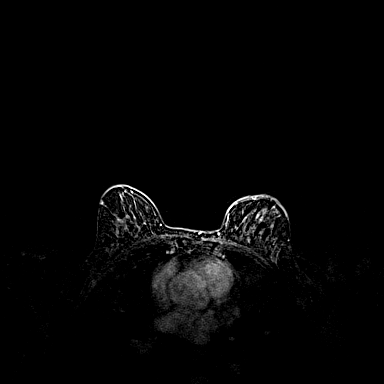
[im 128/160]
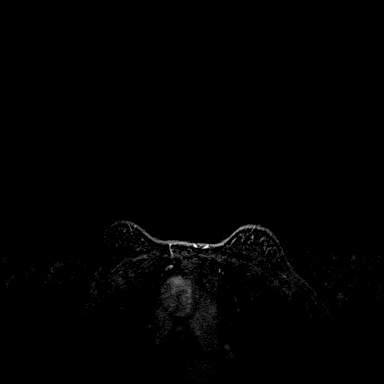

[33 of 48 positions shown; findings below may reference images not displayed]

Three-dimensional MR images were rendered by post-processing of the
original MR data on an independent workstation. The
three-dimensional MR images were interpreted, and findings are
reported in the following complete MRI report for this study. Three
dimensional images were evaluated at the independent interpreting
workstation using the DynaCAD thin client.
FINDINGS: Breast composition: c. Heterogeneous fibroglandular tissue.

Background parenchymal enhancement: Moderate.

Right breast: A 1.6 cm irregular rim enhancing mass within the far
posterolateral RIGHT breast is compatible with biopsy-proven
malignancy (series 6: Image 93). This biopsy-proven malignancy abuts
the chest wall musculature.

Biopsy clip artifact in the UPPER central RIGHT breast from prior
benign MR guided biopsy is noted.

No other suspicious areas of enhancement within the RIGHT breast
noted.

Left breast: No mass or abnormal enhancement.

Lymph nodes: No abnormal appearing lymph nodes.

Ancillary findings:  Hepatic cysts are again noted.
IMPRESSION: 1. 1.6 cm biopsy-proven malignancy within the far posterolateral
RIGHT breast abutting chest wall musculature.
2. No other suspicious findings within either breast. No abnormal
appearing lymph nodes.

RECOMMENDATION:
Treatment plan

BI-RADS CATEGORY  6: Known biopsy-proven malignancy.

## 2022-10-18 ENCOUNTER — Encounter: Payer: Self-pay | Admitting: Hematology and Oncology

## 2022-10-27 IMAGING — DX DG CHEST 1V PORT
1 series · 2 of 2 positions shown · non-contrast
Comparison: None

CLINICAL DATA: Port-A-Cath in place.

EXAM:
PORTABLE CHEST 1 VIEW

[Series 1: chest · 0.14mm/px · 2 of 2 slices shown]
[im 1/2]
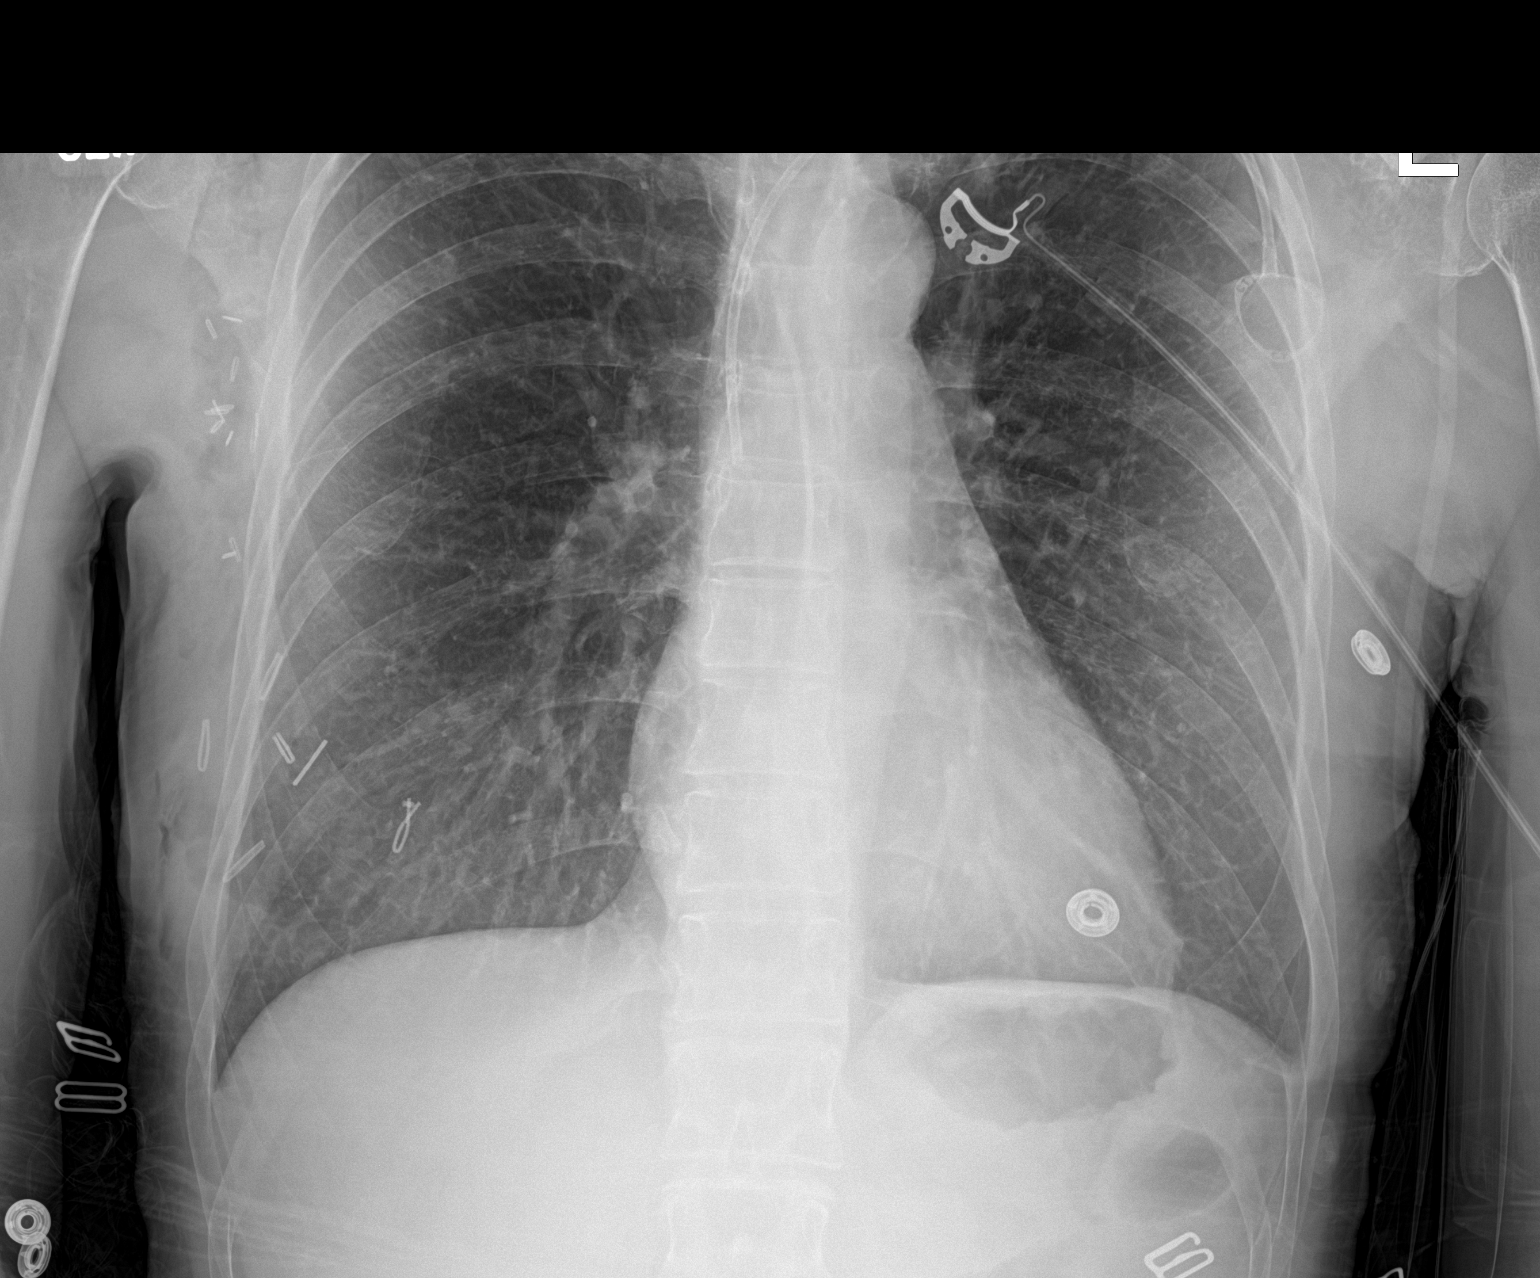
[im 2/2]
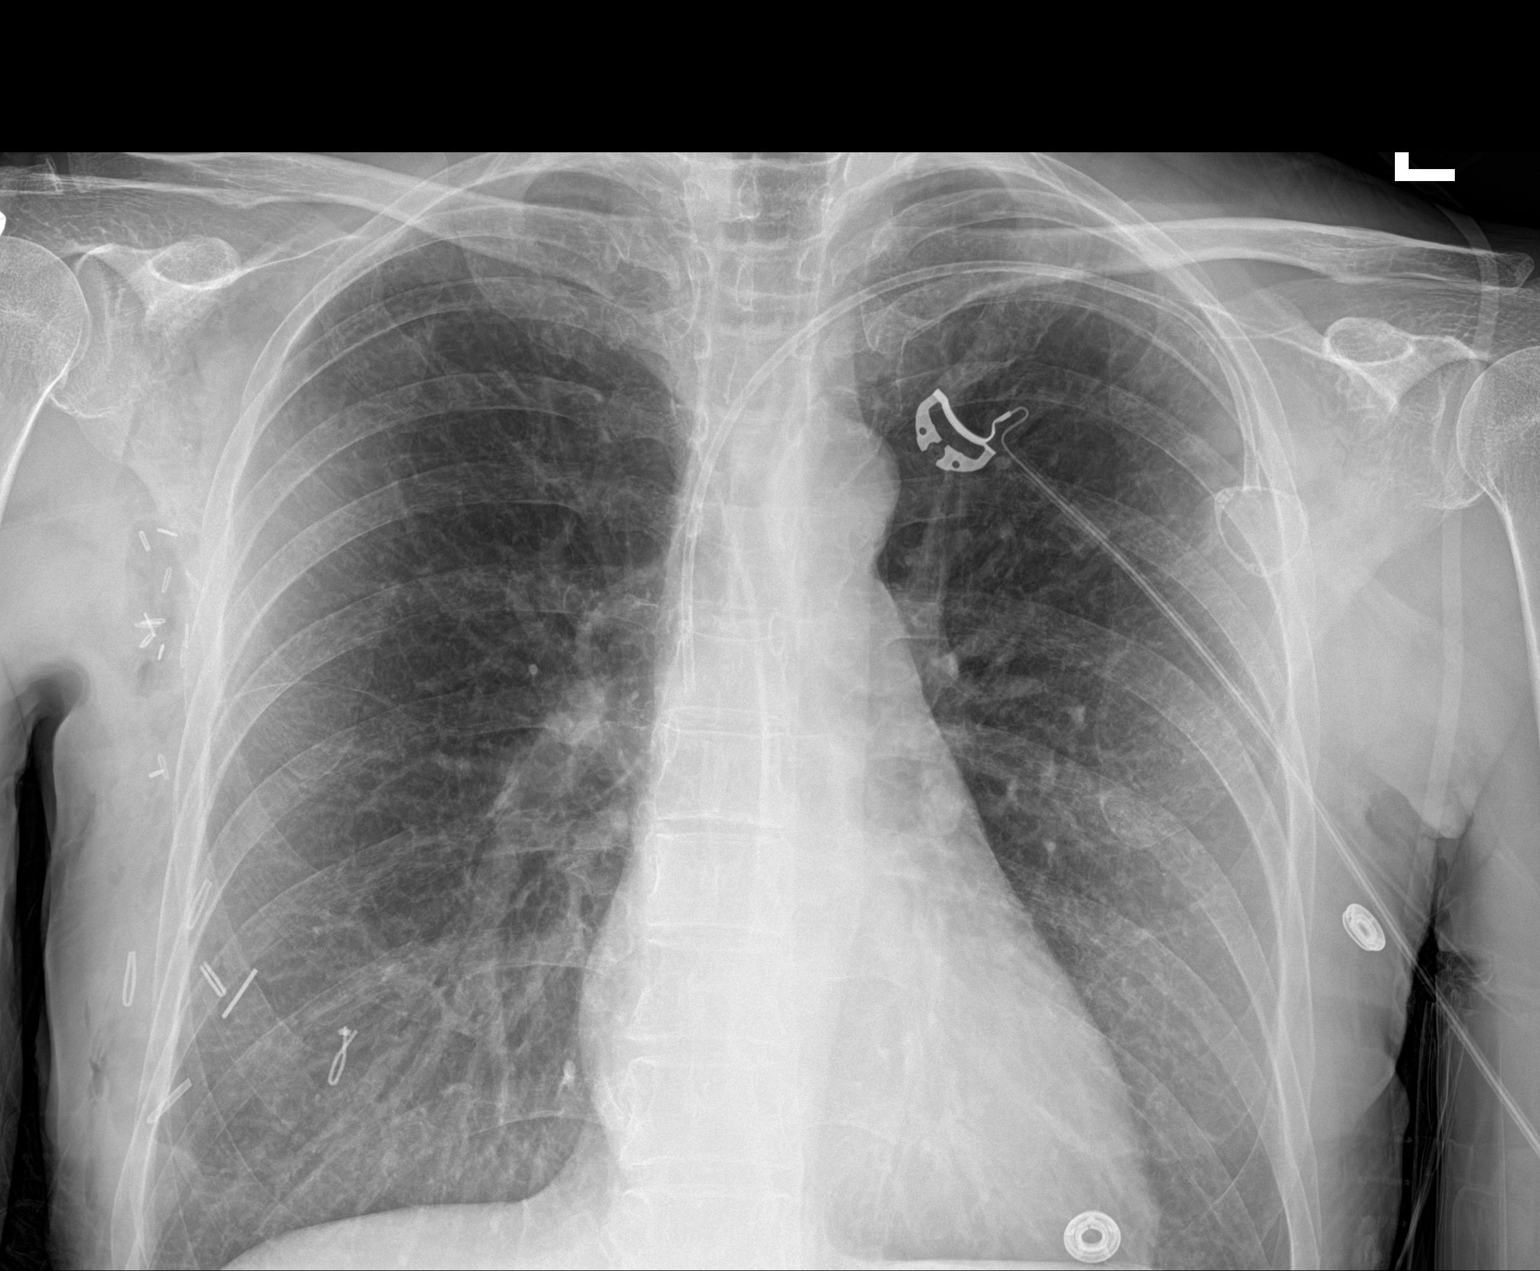

[2 of 2 positions shown; findings below may reference images not displayed]

FINDINGS: The left subclavian Port-A-Cath is in place. The tip is in the
distal SVC, above the cavoatrial junction. Heart size is normal.
Lungs are clear. No pneumothorax is present. Surgical clips are
present in the right axilla and over the right chest wall.
IMPRESSION: Interval placement of left subclavian Port-A-Cath without
radiographic evidence for complication.

## 2022-10-29 ENCOUNTER — Ambulatory Visit: Payer: 59 | Attending: Hematology and Oncology

## 2022-10-29 ENCOUNTER — Encounter: Payer: Self-pay | Admitting: Dermatology

## 2022-10-29 ENCOUNTER — Ambulatory Visit: Payer: 59 | Admitting: Dermatology

## 2022-10-29 VITALS — Wt 120.5 lb

## 2022-10-29 VITALS — BP 137/88

## 2022-10-29 DIAGNOSIS — D1801 Hemangioma of skin and subcutaneous tissue: Secondary | ICD-10-CM | POA: Diagnosis not present

## 2022-10-29 DIAGNOSIS — L821 Other seborrheic keratosis: Secondary | ICD-10-CM | POA: Diagnosis not present

## 2022-10-29 DIAGNOSIS — Z483 Aftercare following surgery for neoplasm: Secondary | ICD-10-CM

## 2022-10-29 DIAGNOSIS — L814 Other melanin hyperpigmentation: Secondary | ICD-10-CM

## 2022-10-29 DIAGNOSIS — D229 Melanocytic nevi, unspecified: Secondary | ICD-10-CM

## 2022-10-29 DIAGNOSIS — D239 Other benign neoplasm of skin, unspecified: Secondary | ICD-10-CM

## 2022-10-29 DIAGNOSIS — D2271 Melanocytic nevi of right lower limb, including hip: Secondary | ICD-10-CM

## 2022-10-29 DIAGNOSIS — D492 Neoplasm of unspecified behavior of bone, soft tissue, and skin: Secondary | ICD-10-CM

## 2022-10-29 DIAGNOSIS — Z1283 Encounter for screening for malignant neoplasm of skin: Secondary | ICD-10-CM

## 2022-10-29 DIAGNOSIS — L578 Other skin changes due to chronic exposure to nonionizing radiation: Secondary | ICD-10-CM

## 2022-10-29 DIAGNOSIS — Z85828 Personal history of other malignant neoplasm of skin: Secondary | ICD-10-CM

## 2022-10-29 DIAGNOSIS — W908XXA Exposure to other nonionizing radiation, initial encounter: Secondary | ICD-10-CM

## 2022-10-29 HISTORY — DX: Other benign neoplasm of skin, unspecified: D23.9
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
## 2022-10-29 NOTE — Therapy (Signed)
OUTPATIENT PHYSICAL THERAPY SOZO SCREENING NOTE   Patient Name: Susan Reid MRN: [id number] DOB:10/13/1965, 57 y.o., female Today's Date: 10/29/2022  PCP: Patient, No Pcp Per REFERRING PROVIDER: Serena Croissant, MD   PT End of Session - 10/29/22 1504     Visit Number 11   # unchanged due to screen only   PT Start Time 1443    PT Stop Time 1446    PT Time Calculation (min) 3 min    Activity Tolerance Patient tolerated treatment well    Behavior During Therapy WFL for tasks assessed/performed             Past Medical History:  Diagnosis Date   Allergy    Cancer (HCC)    Family history of adverse reaction to anesthesia    Mother got severe N/V and pneumonia following anesthesia   History of radiation therapy    Right breast- 08/16/21-09/13/21- Dr. Antony Blackbird   Osteopenia    Past Surgical History:  Procedure Laterality Date   AXILLARY SENTINEL NODE BIOPSY Right 04/20/2021   Procedure: RIGHT AXILLARY SENTINEL NODE BIOPSY;  Surgeon: Almond Lint, MD;  Location: [facility] OR;  Service: General;  Laterality: Right;   BREAST BIOPSY Right 02/25/2020   PASH   BREAST BIOPSY Right 03/14/2021   BREAST LUMPECTOMY Right 02/2021   BREAST LUMPECTOMY WITH SENTINEL LYMPH NODE BIOPSY Right 04/20/2021   Procedure: RIGHT BREAST LUMPECTOMY;  Surgeon: Almond Lint, MD;  Location: [facility] OR;  Service: General;  Laterality: Right;   PORT-A-CATH REMOVAL Left 05/16/2022   Procedure: REMOVAL PORT-A-CATH;  Surgeon: Almond Lint, MD;  Location: [facility];  Service: General;  Laterality: Left;   PORTACATH PLACEMENT Left 04/20/2021   Procedure: INSERTION PORT-A-CATH;  Surgeon: Almond Lint, MD;  Location: [facility] OR;  Service: General;  Laterality: Left;   WISDOM TOOTH EXTRACTION  04/24/1987   Patient Active Problem List   Diagnosis Date Noted   Port-A-Cath in place 05/18/2021   Genetic testing 04/03/2021   Family history of breast cancer 03/23/2021   Malignant neoplasm of upper-outer  quadrant of right breast in female, estrogen receptor positive (HCC) 03/20/2021   Osteoporosis 12/10/2018    REFERRING DIAG: right breast cancer at risk for lymphedema  THERAPY DIAG: Aftercare following surgery for neoplasm  PERTINENT HISTORY: Patient was diagnosed on 03/07/2021 with 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 She had a right lumpectomy and senitnel node biopsy (5 negative nodes) on 04/20/2021. It is ER positive, PR negative, and HER2 positive with a Ki67 of 80%.   PRECAUTIONS: right UE Lymphedema risk, None  SUBJECTIVE: Pt returns for her 3 month L-Dex screen.   PAIN:  Are you having pain? No  SOZO SCREENING: Patient was assessed today using the SOZO machine to determine the lymphedema index score. This was compared to her baseline score. It was determined that she is within the recommended range when compared to her baseline and no further action is needed at this time. She will continue SOZO screenings. These are done every 3 months for 2 years post operatively followed by every 6 months for 2 years, and then annually.   L-DEX FLOWSHEETS - 10/29/22 1500       L-DEX LYMPHEDEMA SCREENING   Measurement Type Unilateral    L-DEX MEASUREMENT EXTREMITY Upper Extremity    POSITION  Standing    DOMINANT SIDE Right    At Risk Side Right    BASELINE SCORE (UNILATERAL) 2.8    L-DEX SCORE (UNILATERAL) -0.3  VALUE CHANGE (UNILAT) -3.1               Hermenia Bers, PTA 10/29/2022, 3:06 PM

## 2022-10-29 NOTE — Patient Instructions (Addendum)
Patient Handout: Wound Care for Skin Biopsy Site  Patient Handout: Wound Care for Skin Biopsy Site  Taking Care of Your Skin Biopsy Site  Proper care of the biopsy site is essential for promoting healing and minimizing scarring. This handout provides instructions on how to care for your biopsy site to ensure optimal recovery.  1. Cleaning the Wound:  Clean the biopsy site daily with gentle soap and water. Gently pat the area dry with a clean, soft towel. Avoid harsh scrubbing or rubbing the area, as this can irritate the skin and delay healing.  2. Applying Aquaphor and Bandage:  After cleaning the wound, apply a thin layer of Aquaphor ointment to the biopsy site. Cover the area with a sterile bandage to protect it from dirt, bacteria, and friction. Change the bandage daily or as needed if it becomes soiled or wet.  3. Continued Care for One Week:  Repeat the cleaning, Aquaphor application, and bandaging process daily for one week following the biopsy procedure. Keeping the wound clean and moist during this initial healing period will help prevent infection and promote optimal healing.  4. Massaging Aquaphor into the Area:  ---After one week, discontinue the use of bandages but continue to apply Aquaphor to the biopsy site. ----Gently massage the Aquaphor into the area using circular motions. ---Massaging the skin helps to promote circulation and prevent the formation of scar tissue.   Additional Tips:  Avoid exposing the biopsy site to direct sunlight during the healing process, as this can cause hyperpigmentation or worsen scarring. If you experience any signs of infection, such as increased redness, swelling, warmth, or drainage from the wound, contact your healthcare provider immediately. Follow any additional instructions provided by your healthcare provider for caring for the biopsy site and managing any discomfort. Conclusion:  Taking proper care of your skin biopsy site  is crucial for ensuring optimal healing and minimizing scarring. By following these instructions for cleaning, applying Aquaphor, and massaging the area, you can promote a smooth and successful recovery. If you have any questions or concerns about caring for your biopsy site, don't hesitate to contact your healthcare provider for guidance.    Due to recent changes in healthcare laws, you may see results of your pathology and/or laboratory studies on MyChart before the doctors have had a chance to review them. We understand that in some cases there may be results that are confusing or concerning to you. Please understand that not all results are received at the same time and often the doctors may need to interpret multiple results in order to provide you with the best plan of care or course of treatment. Therefore, we ask that you please give us 2 business days to thoroughly review all your results before contacting the office for clarification. Should we see a critical lab result, you will be contacted sooner.   If You Need Anything After Your Visit  If you have any questions or concerns for your doctor, please call our main line at [phone] If no one answers, please leave a voicemail as directed and we will return your call as soon as possible. Messages left after 4 pm will be answered the following business day.   You may also send us a message via MyChart. We typically respond to MyChart messages within 1-2 business days.  For prescription refills, please ask your pharmacy to contact our office. Our fax number is [phone].  If you have an urgent issue when the clinic is closed that   cannot wait until the next business day, you can page your doctor at the number below.    Please note that while we do our best to be available for urgent issues outside of office hours, we are not available 24/7.   If you have an urgent issue and are unable to reach us, you may choose to seek medical care at  your doctor's office, retail clinic, urgent care center, or emergency room.  If you have a medical emergency, please immediately call 911 or go to the emergency department. In the event of inclement weather, please call our main line at [phone] for an update on the status of any delays or closures.  Dermatology Medication Tips: Please keep the boxes that topical medications come in in order to help keep track of the instructions about where and how to use these. Pharmacies typically print the medication instructions only on the boxes and not directly on the medication tubes.   If your medication is too expensive, please contact our office at [phone] or send us a message through MyChart.   We are unable to tell what your co-pay for medications will be in advance as this is different depending on your insurance coverage. However, we may be able to find a substitute medication at lower cost or fill out paperwork to get insurance to cover a needed medication.   If a prior authorization is required to get your medication covered by your insurance company, please allow us 1-2 business days to complete this process.  Drug prices often vary depending on where the prescription is filled and some pharmacies may offer cheaper prices.  The website www.goodrx.com contains coupons for medications through different pharmacies. The prices here do not account for what the cost may be with help from insurance (it may be cheaper with your insurance), but the website can give you the price if you did not use any insurance.  - You can print the associated coupon and take it with your prescription to the pharmacy.  - You may also stop by our office during regular business hours and pick up a GoodRx coupon card.  - If you need your prescription sent electronically to a different pharmacy, notify our office through [facility] MyChart or by phone at [phone]    Skin Education :   I counseled the  patient regarding the following: Sun screen (SPF 30 or greater) should be applied during peak UV exposure (between 10am and 2pm) and reapplied after exercise or swimming.  The ABCDEs of melanoma were reviewed with the patient, and the importance of monthly self-examination of moles was emphasized. Should any moles change in shape or color, or itch, bleed or burn, pt will contact our office for evaluation sooner then their interval appointment.  Plan: Sunscreen Recommendations I recommended a broad spectrum sunscreen with a SPF of 30 or higher. I explained that SPF 30 sunscreens block approximately 97 percent of the sun's harmful rays. Sunscreens should be applied at least 15 minutes prior to expected sun exposure and then every 2 hours after that as long as sun exposure continues. If swimming or exercising sunscreen should be reapplied every 45 minutes to an hour after getting wet or sweating. One ounce, or the equivalent of a shot glass full of sunscreen, is adequate to protect the skin not covered by a bathing suit. I also recommended a lip balm with a sunscreen as well. Sun protective clothing can be used in lieu of sunscreen but must be   worn the entire time you are exposed to the sun's rays.  

## 2022-11-05 NOTE — Progress Notes (Signed)
Hi Susan Reid  Dr. Onalee Hua reviewed your biopsy results and they showed the spot removed was a little "abnormal" but not cancerous.  No additional treatment is required.  We will continue to monitor the area for re-pigmentation during your annual skin exams. The detailed report is available to view in MyChart.  Have a great day!  Kind Regards,  Dr. Kermit Balo Care Team

## 2022-11-06 ENCOUNTER — Inpatient Hospital Stay: Payer: 59 | Attending: Hematology and Oncology

## 2022-11-06 ENCOUNTER — Inpatient Hospital Stay: Payer: 59

## 2022-11-06 ENCOUNTER — Other Ambulatory Visit: Payer: Self-pay

## 2022-11-06 VITALS — BP 106/79 | HR 69 | Temp 98.9°F | Resp 14

## 2022-11-06 DIAGNOSIS — Z79811 Long term (current) use of aromatase inhibitors: Secondary | ICD-10-CM | POA: Diagnosis not present

## 2022-11-06 DIAGNOSIS — Z170 Estrogen receptor positive status [ER+]: Secondary | ICD-10-CM | POA: Insufficient documentation

## 2022-11-06 DIAGNOSIS — C50411 Malignant neoplasm of upper-outer quadrant of right female breast: Secondary | ICD-10-CM | POA: Diagnosis not present

## 2022-11-06 DIAGNOSIS — Z95828 Presence of other vascular implants and grafts: Secondary | ICD-10-CM

## 2022-11-06 DIAGNOSIS — M810 Age-related osteoporosis without current pathological fracture: Secondary | ICD-10-CM | POA: Diagnosis not present

## 2022-11-06 DIAGNOSIS — Z5181 Encounter for therapeutic drug level monitoring: Secondary | ICD-10-CM

## 2022-11-06 LAB — CMP (CANCER CENTER ONLY)
ALT: 17 U/L (ref 0–44)
AST: 21 U/L (ref 15–41)
Albumin: 4.5 g/dL (ref 3.5–5.0)
Alkaline Phosphatase: 64 U/L (ref 38–126)
Anion gap: 7 (ref 5–15)
BUN: 18 mg/dL (ref 6–20)
CO2: 31 mmol/L (ref 22–32)
Calcium: 10 mg/dL (ref 8.9–10.3)
Chloride: 105 mmol/L (ref 98–111)
Creatinine: 0.76 mg/dL (ref 0.44–1.00)
GFR, Estimated: >60 mL/min (ref >60)
Glucose, Bld: 73 mg/dL (ref 70–99)
Potassium: 4 mmol/L (ref 3.5–5.1)
Sodium: 143 mmol/L (ref 135–145)
Total Bilirubin: 0.5 mg/dL (ref 0.3–1.2)
Total Protein: 6.9 g/dL (ref 6.5–8.1)

## 2022-11-06 LAB — CBC WITH DIFFERENTIAL (CANCER CENTER ONLY)
Abs Immature Granulocytes: 0.01 10*3/uL (ref 0.00–0.07)
Basophils Absolute: 0 10*3/uL (ref 0.0–0.1)
Basophils Relative: 1 %
Eosinophils Absolute: 0.3 10*3/uL (ref 0.0–0.5)
Eosinophils Relative: 5 %
HCT: 43.5 % (ref 36.0–46.0)
Hemoglobin: 14.3 g/dL (ref 12.0–15.0)
Immature Granulocytes: 0 %
Lymphocytes Relative: 24 %
Lymphs Abs: 1.3 10*3/uL (ref 0.7–4.0)
MCH: 30.8 pg (ref 26.0–34.0)
MCHC: 32.9 g/dL (ref 30.0–36.0)
MCV: 93.5 fL (ref 80.0–100.0)
Monocytes Absolute: 0.5 10*3/uL (ref 0.1–1.0)
Monocytes Relative: 9 %
Neutro Abs: 3.3 10*3/uL (ref 1.7–7.7)
Neutrophils Relative %: 61 %
Platelet Count: 247 10*3/uL (ref 150–400)
RBC: 4.65 MIL/uL (ref 3.87–5.11)
RDW: 12.7 % (ref 11.5–15.5)
WBC Count: 5.4 10*3/uL (ref 4.0–10.5)
nRBC: 0 % (ref 0.0–0.2)

## 2022-11-06 MED ORDER — ZOLEDRONIC ACID 4 MG/100ML IV SOLN
4.0000 mg | Freq: Once | INTRAVENOUS | Status: AC
  Administered 2022-11-06: 4 mg via INTRAVENOUS
  Filled 2022-11-06: qty 100

## 2022-11-06 MED ORDER — SODIUM CHLORIDE 0.9 % IV SOLN: Freq: Once | INTRAVENOUS | Status: AC

## 2022-11-06 NOTE — Patient Instructions (Signed)

## 2022-11-12 ENCOUNTER — Ambulatory Visit: Payer: Self-pay | Admitting: Rehabilitation

## 2022-11-14 ENCOUNTER — Telehealth: Payer: Self-pay | Admitting: Gastroenterology

## 2022-11-14 NOTE — Telephone Encounter (Signed)
PT has a recall for 2025 and wants to have procedure early because she met her deductible. Is it okay to schedule early? Please advise.

## 2022-11-14 NOTE — Telephone Encounter (Signed)
(  Subcm TA x 22 June 2016 on 1st colonoscopy)  Yes, she can do it before the end of this calendar year and we would be within current polyp surveillance guidelines.  Please arrange an [facility] pre-visit.  - HD

## 2022-11-14 NOTE — Telephone Encounter (Signed)
Shon, please see note below. Thanks

## 2022-11-15 ENCOUNTER — Encounter: Payer: Self-pay | Admitting: Gastroenterology

## 2022-11-26 ENCOUNTER — Other Ambulatory Visit: Payer: Self-pay | Admitting: Hematology and Oncology

## 2022-11-26 DIAGNOSIS — Z853 Personal history of malignant neoplasm of breast: Secondary | ICD-10-CM

## 2022-11-29 ENCOUNTER — Encounter: Payer: 59 | Admitting: Family Medicine

## 2022-12-05 ENCOUNTER — Encounter: Payer: Self-pay | Admitting: Hematology and Oncology

## 2022-12-05 NOTE — Telephone Encounter (Signed)
Attempted to call pt regarding signatera results lvm for pt to return call back to receive results. Results were negative. Signatera will be out in the next 3-6 months to repeat labs.

## 2022-12-12 ENCOUNTER — Ambulatory Visit (INDEPENDENT_AMBULATORY_CARE_PROVIDER_SITE_OTHER): Payer: 59 | Admitting: Family Medicine

## 2022-12-12 ENCOUNTER — Encounter: Payer: Self-pay | Admitting: Family Medicine

## 2022-12-12 VITALS — BP 118/78 | HR 69 | Temp 97.6°F | Ht 64.25 in | Wt 117.1 lb

## 2022-12-12 DIAGNOSIS — M810 Age-related osteoporosis without current pathological fracture: Secondary | ICD-10-CM | POA: Diagnosis not present

## 2022-12-12 DIAGNOSIS — Z803 Family history of malignant neoplasm of breast: Secondary | ICD-10-CM

## 2022-12-12 DIAGNOSIS — Z86010 Personal history of colonic polyps: Secondary | ICD-10-CM | POA: Diagnosis not present

## 2022-12-12 DIAGNOSIS — J3089 Other allergic rhinitis: Secondary | ICD-10-CM

## 2022-12-12 DIAGNOSIS — J309 Allergic rhinitis, unspecified: Secondary | ICD-10-CM | POA: Insufficient documentation

## 2022-12-12 NOTE — Assessment & Plan Note (Signed)
Dexa reviewed 01/2021 (ordered by gyn [facility])  On letrozole (year 1 of 7 for breast cancer)   On zometa twice yearly  No recent falls or fractures   Takes vitamin D Good exercise incl strength training

## 2022-12-12 NOTE — Progress Notes (Signed)
Subjective:    Patient ID: Susan Reid, female    DOB: 31-Oct-1965, 57 y.o.   MRN: [id number]  HPI  Wt Readings from Last 3 Encounters:  12/12/22 117 lb 2 oz (53.1 kg)  10/29/22 120 lb 8 oz (54.7 kg)  07/16/22 121 lb 2 oz (54.9 kg)   19.95 kg/m  Vitals:   12/12/22 1126  BP: 118/78  Pulse: 69  Temp: 97.6 F (36.4 C)  SpO2: 94%    Pt presents to transfer care from Dr Selena Batten    Nasal allergies  Skin allergies also  Is outdoors a lot  Very all to poison oak   Flonase  Claritin   No asthma No eczema   Preventative care   Shingrix - cannot have due to recent chemo  Maybe later   Unsure if she can get flu shot   Cannot have covid shot- spiked her protein   Mammogram scheduled 02/2023 Personal history of breast cancer - had lumpectomy, rad and chemo  Sees Dr Pamelia Hoit  On letrozole - year 1 of 7  Achy/ some issues with sleep -tolerating ok    [facility]   Dexa 01/2021 - has it scheduled [facility] nov 4 [facility]  OSTEOPOROSIS Falls/fracture-none  Zometa infusion every 6 mo  Has one left   Takes vitamin D - Last vitamin D Lab Results  Component Value Date   VD25OH 47.78 12/21/2014   Exercise- walks 1-2 mi per day and weights /strength training   Colonoscopy 06/2016 - 5 y recall Scheduled for oct 29th  Due to polyps     Cholesterol Lab Results  Component Value Date   CHOL 165 02/02/2021   HDL 72.10 02/02/2021   LDLCALC 60 02/02/2021   TRIG 167.0 (H) 02/02/2021   CHOLHDL 2 02/02/2021     Patient Active Problem List   Diagnosis Date Noted   Allergic rhinitis 12/12/2022   History of colon polyps 12/12/2022   Port-A-Cath in place 05/18/2021   Genetic testing 04/03/2021   Family history of breast cancer 03/23/2021   Malignant neoplasm of upper-outer quadrant of right breast in female, estrogen receptor positive (HCC) 03/20/2021   Osteoporosis 12/10/2018   Past Medical History:  Diagnosis Date   Allergy    Cancer (HCC)     Dysplastic nevus 10/29/2022   right anterior foot. Moderate atypia   Family history of adverse reaction to anesthesia    Mother got severe N/V and pneumonia following anesthesia   History of radiation therapy    Right breast- 08/16/21-09/13/21- Dr. Antony Blackbird   Osteopenia    Past Surgical History:  Procedure Laterality Date   AXILLARY SENTINEL NODE BIOPSY Right 04/20/2021   Procedure: RIGHT AXILLARY SENTINEL NODE BIOPSY;  Surgeon: Almond Lint, MD;  Location: [facility];  Service: General;  Laterality: Right;   BREAST BIOPSY Right 02/25/2020   PASH   BREAST BIOPSY Right 03/14/2021   BREAST LUMPECTOMY Right 02/2021   BREAST LUMPECTOMY WITH SENTINEL LYMPH NODE BIOPSY Right 04/20/2021   Procedure: RIGHT BREAST LUMPECTOMY;  Surgeon: Almond Lint, MD;  Location: [facility];  Service: General;  Laterality: Right;   PORT-A-CATH REMOVAL Left 05/16/2022   Procedure: REMOVAL PORT-A-CATH;  Surgeon: Almond Lint, MD;  Location: [facility];  Service: General;  Laterality: Left;   PORTACATH PLACEMENT Left 04/20/2021   Procedure: INSERTION PORT-A-CATH;  Surgeon: Almond Lint, MD;  Location: [facility];  Service: General;  Laterality: Left;   WISDOM TOOTH EXTRACTION  04/24/1987   Social History   Tobacco Use   Smoking status: Never   Smokeless tobacco: Never  Vaping Use   Vaping status: Never Used  Substance Use Topics   Alcohol use: Not Currently    Comment: one a month or less   Drug use: No   Family History  Problem Relation Age of Onset   Arthritis Mother    Hyperlipidemia Mother    Heart disease Mother    Stroke Mother 74   Hypertension Mother    Kidney disease Mother    Breast cancer Mother 71   Cirrhosis Mother    Mental illness Father    Depression Father    Lung cancer Maternal Aunt 36   Lung cancer Maternal Uncle 64   Cancer Maternal Grandfather        Oral  - smoker   Lung cancer Maternal Grandfather    Cancer Paternal Grandmother        unknown type    Prostate cancer Paternal Grandfather    Breast cancer Maternal Great-grandmother    Colon cancer Neg Hx    Colon polyps Neg Hx    Esophageal cancer Neg Hx    Rectal cancer Neg Hx    Stomach cancer Neg Hx    Allergies  Allergen Reactions   Nitrates, Organic Anaphylaxis    Throat closes   Covid-19 (Mrna) Vaccine     Spikes protein levels    Current Outpatient Medications on File Prior to Visit  Medication Sig Dispense Refill   Ascorbic Acid (VITAMIN C PO) Take 500 mg by mouth daily.     cholecalciferol (VITAMIN D) 1000 UNITS tablet Take 1,000 Units by mouth daily.     fluticasone (FLONASE) 50 MCG/ACT nasal spray Place 1 spray into both nostrils daily as needed for allergies or rhinitis.     letrozole (FEMARA) 2.5 MG tablet TAKE 1 TABLET BY MOUTH EVERY DAY 90 tablet 3   loratadine (CLARITIN) 10 MG tablet Take 10 mg by mouth daily as needed for allergies.     Multiple Vitamin (MULTIVITAMIN) tablet Take 1 tablet by mouth daily.     No current facility-administered medications on file prior to visit.    Review of Systems  Constitutional:  Positive for fatigue. Negative for activity change, appetite change, fever and unexpected weight change.  HENT:  Negative for congestion, ear pain, rhinorrhea, sinus pressure and sore throat.   Eyes:  Negative for pain, redness and visual disturbance.  Respiratory:  Negative for cough, shortness of breath and wheezing.   Cardiovascular:  Negative for chest pain and palpitations.  Gastrointestinal:  Negative for abdominal pain, blood in stool, constipation and diarrhea.  Endocrine: Negative for polydipsia and polyuria.  Genitourinary:  Negative for dysuria, frequency and urgency.  Musculoskeletal:  Positive for arthralgias. Negative for back pain, joint swelling and myalgias.  Skin:  Negative for pallor and rash.  Allergic/Immunologic: Negative for environmental allergies.  Neurological:  Negative for dizziness, syncope and headaches.   Hematological:  Negative for adenopathy. Does not bruise/bleed easily.  Psychiatric/Behavioral:  Positive for sleep disturbance. Negative for decreased concentration and dysphoric mood. The patient is not nervous/anxious.        Objective:   Physical Exam Constitutional:      General: She is not in acute distress.    Appearance: Normal appearance. She is well-developed and normal weight. She is not ill-appearing or diaphoretic.     Comments: Slim but not frail appearing   HENT:  Head: Normocephalic and atraumatic.     Right Ear: Tympanic membrane and ear canal normal.     Left Ear: Tympanic membrane and ear canal normal.     Ears:     Comments: Scant cerumen     Mouth/Throat:     Mouth: Mucous membranes are moist.  Eyes:     Conjunctiva/sclera: Conjunctivae normal.     Pupils: Pupils are equal, round, and reactive to light.  Neck:     Thyroid: No thyromegaly.     Vascular: No carotid bruit or JVD.  Cardiovascular:     Rate and Rhythm: Normal rate and regular rhythm.     Heart sounds: Normal heart sounds.     No gallop.  Pulmonary:     Effort: Pulmonary effort is normal. No respiratory distress.     Breath sounds: Normal breath sounds. No stridor. No wheezing, rhonchi or rales.  Abdominal:     General: There is no distension or abdominal bruit.     Palpations: Abdomen is soft.  Musculoskeletal:     Cervical back: Normal range of motion and neck supple.     Right lower leg: No edema.     Left lower leg: No edema.     Comments: Borderline kyphosis   Lymphadenopathy:     Cervical: No cervical adenopathy.  Skin:    General: Skin is warm and dry.     Coloration: Skin is not pale.     Findings: No rash.  Neurological:     Mental Status: She is alert.     Coordination: Coordination normal.     Deep Tendon Reflexes: Reflexes are normal and symmetric. Reflexes normal.  Psychiatric:        Mood and Affect: Mood normal.           Assessment & Plan:   Problem List  Items Addressed This Visit       Respiratory   Allergic rhinitis - Primary    Pt has nasal and skin allergies  No eczema but very sensitive to poison ivy Some outdoor and indoor  Continues claritin prn Flonase          Musculoskeletal and Integument   Osteoporosis    Dexa reviewed 01/2021 ([facility])  On letrozole (year 1 of 7 for breast cancer)   On zometa twice yearly  No recent falls or fractures   Takes vitamin D Good exercise incl strength training         Other   Family history of breast cancer    Pt did genetic testing  Personal history of breast cancer also       History of colon polyps    Colonoscopy is due for 5 y recall/ scheduled in oct

## 2022-12-12 NOTE — Assessment & Plan Note (Signed)
Colonoscopy is due for 5 y recall/ scheduled in oct

## 2022-12-12 NOTE — Patient Instructions (Signed)
Next time you see gyn or oncology- you are due for a cholesterol screen and vitamin D level   Take care of yourself  Protect yourself from insects and the sun  Keep exercising

## 2022-12-12 NOTE — Assessment & Plan Note (Signed)
Pt did genetic testing  Personal history of breast cancer also

## 2022-12-12 NOTE — Assessment & Plan Note (Signed)
Pt has nasal and skin allergies  No eczema but very sensitive to poison ivy Some outdoor and indoor  Continues claritin prn Flonase

## 2023-01-22 ENCOUNTER — Encounter: Payer: Self-pay | Admitting: Hematology and Oncology

## 2023-01-25 ENCOUNTER — Ambulatory Visit (AMBULATORY_SURGERY_CENTER): Payer: 59

## 2023-01-25 VITALS — Ht 65.0 in | Wt 113.0 lb

## 2023-01-25 DIAGNOSIS — Z860100 Personal history of colon polyps, unspecified: Secondary | ICD-10-CM

## 2023-01-25 MED ORDER — NA SULFATE-K SULFATE-MG SULF 17.5-3.13-1.6 GM/177ML PO SOLN: 1.0000 | Freq: Once | ORAL | 0 refills | Status: AC

## 2023-01-25 NOTE — Progress Notes (Signed)

## 2023-02-04 ENCOUNTER — Ambulatory Visit: Payer: 59 | Attending: Hematology and Oncology

## 2023-02-04 VITALS — Wt 118.2 lb

## 2023-02-04 DIAGNOSIS — Z483 Aftercare following surgery for neoplasm: Secondary | ICD-10-CM | POA: Insufficient documentation

## 2023-02-04 NOTE — Therapy (Signed)
OUTPATIENT PHYSICAL THERAPY SOZO SCREENING NOTE   Patient Name: Susan Reid MRN: [id number] DOB:1965/09/20, 57 y.o., female Today's Date: 02/04/2023  PCP: Judy Pimple, MD REFERRING PROVIDER: Serena Croissant, MD   PT End of Session - 02/04/23 1528     Visit Number 11   # unchanged due to screen only   PT Start Time 1526    PT Stop Time 1529    PT Time Calculation (min) 3 min    Activity Tolerance Patient tolerated treatment well    Behavior During Therapy [facility] for tasks assessed/performed             Past Medical History:  Diagnosis Date   Allergy    Cancer (HCC)    Dysplastic nevus 10/29/2022   right anterior foot. Moderate atypia   Family history of adverse reaction to anesthesia    Mother got severe N/V and pneumonia following anesthesia   History of radiation therapy    Right breast- 08/16/21-09/13/21- Dr. Antony Blackbird   Osteopenia    Osteoporosis    Past Surgical History:  Procedure Laterality Date   AXILLARY SENTINEL NODE BIOPSY Right 04/20/2021   Procedure: RIGHT AXILLARY SENTINEL NODE BIOPSY;  Surgeon: Almond Lint, MD;  Location: [facility];  Service: General;  Laterality: Right;   BREAST BIOPSY Right 02/25/2020   PASH   BREAST BIOPSY Right 03/14/2021   BREAST LUMPECTOMY Right 02/2021   BREAST LUMPECTOMY WITH SENTINEL LYMPH NODE BIOPSY Right 04/20/2021   Procedure: RIGHT BREAST LUMPECTOMY;  Surgeon: Almond Lint, MD;  Location: [facility];  Service: General;  Laterality: Right;   PORT-A-CATH REMOVAL Left 05/16/2022   Procedure: REMOVAL PORT-A-CATH;  Surgeon: Almond Lint, MD;  Location: [facility];  Service: General;  Laterality: Left;   PORTACATH PLACEMENT Left 04/20/2021   Procedure: INSERTION PORT-A-CATH;  Surgeon: Almond Lint, MD;  Location: [facility];  Service: General;  Laterality: Left;   WISDOM TOOTH EXTRACTION  04/24/1987   Patient Active Problem List   Diagnosis Date Noted   Allergic rhinitis 12/12/2022   History of colon polyps  12/12/2022   Port-A-Cath in place 05/18/2021   Genetic testing 04/03/2021   Family history of breast cancer 03/23/2021   Malignant neoplasm of upper-outer quadrant of right breast in female, estrogen receptor positive (HCC) 03/20/2021   Osteoporosis 12/10/2018    REFERRING DIAG: right breast cancer at risk for lymphedema  THERAPY DIAG: Aftercare following surgery for neoplasm  PERTINENT HISTORY: Patient was diagnosed on 03/07/2021 with 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 She had a right lumpectomy and senitnel node biopsy (5 negative nodes) on 04/20/2021. It is ER positive, PR negative, and HER2 positive with a Ki67 of 80%.   PRECAUTIONS: right UE Lymphedema risk, None  SUBJECTIVE: Pt returns for her 3 month L-Dex screen.   PAIN:  Are you having pain? No  SOZO SCREENING: Patient was assessed today using the SOZO machine to determine the lymphedema index score. This was compared to her baseline score. It was determined that she is within the recommended range when compared to her baseline and no further action is needed at this time. She will continue SOZO screenings. These are done every 3 months for 2 years post operatively followed by every 6 months for 2 years, and then annually.   L-DEX FLOWSHEETS - 02/04/23 1500       L-DEX LYMPHEDEMA SCREENING   Measurement Type Unilateral    L-DEX MEASUREMENT EXTREMITY Upper Extremity    POSITION  Standing  DOMINANT SIDE Right    At Risk Side Right    BASELINE SCORE (UNILATERAL) 2.8    L-DEX SCORE (UNILATERAL) -1.1    VALUE CHANGE (UNILAT) -3.9            P: Start with 6 month L-Dex screens next.    Hermenia Bers, PTA 02/04/2023, 3:29 PM

## 2023-02-07 ENCOUNTER — Encounter: Payer: Self-pay | Admitting: Gastroenterology

## 2023-02-19 ENCOUNTER — Encounter: Payer: Self-pay | Admitting: Gastroenterology

## 2023-02-19 ENCOUNTER — Ambulatory Visit: Payer: 59 | Admitting: Gastroenterology

## 2023-02-19 VITALS — BP 124/66 | HR 78 | Temp 98.0°F | Resp 15 | Ht 65.0 in | Wt 113.0 lb

## 2023-02-19 DIAGNOSIS — D123 Benign neoplasm of transverse colon: Secondary | ICD-10-CM

## 2023-02-19 DIAGNOSIS — Z860100 Personal history of colon polyps, unspecified: Secondary | ICD-10-CM

## 2023-02-19 DIAGNOSIS — Z09 Encounter for follow-up examination after completed treatment for conditions other than malignant neoplasm: Secondary | ICD-10-CM

## 2023-02-19 DIAGNOSIS — D122 Benign neoplasm of ascending colon: Secondary | ICD-10-CM | POA: Diagnosis not present

## 2023-02-19 DIAGNOSIS — Z860101 Personal history of adenomatous and serrated colon polyps: Secondary | ICD-10-CM

## 2023-02-19 MED ORDER — SODIUM CHLORIDE 0.9 % IV SOLN: 500.0000 mL | Freq: Once | INTRAVENOUS | Status: DC

## 2023-02-19 NOTE — Progress Notes (Signed)
History and Physical:  This patient presents for endoscopic testing for: Encounter Diagnosis  Name Primary?   History of colon polyps Yes    Surveillance colonoscopy today SubCM TX x 2 in March 2018 Patient denies chronic abdominal pain, rectal bleeding, constipation or diarrhea.   Patient is otherwise without complaints or active issues today.   Past Medical History: Past Medical History:  Diagnosis Date   Allergy    Cancer (HCC)    Dysplastic nevus 10/29/2022   right anterior foot. Moderate atypia   Family history of adverse reaction to anesthesia    Mother got severe N/V and pneumonia following anesthesia   History of radiation therapy    Right breast- 08/16/21-09/13/21- Dr. Antony Blackbird   Osteopenia    Osteoporosis      Past Surgical History: Past Surgical History:  Procedure Laterality Date   AXILLARY SENTINEL NODE BIOPSY Right 04/20/2021   Procedure: RIGHT AXILLARY SENTINEL NODE BIOPSY;  Surgeon: Almond Lint, MD;  Location: [facility];  Service: General;  Laterality: Right;   BREAST BIOPSY Right 02/25/2020   PASH   BREAST BIOPSY Right 03/14/2021   BREAST LUMPECTOMY Right 02/2021   BREAST LUMPECTOMY WITH SENTINEL LYMPH NODE BIOPSY Right 04/20/2021   Procedure: RIGHT BREAST LUMPECTOMY;  Surgeon: Almond Lint, MD;  Location: [facility];  Service: General;  Laterality: Right;   PORT-A-CATH REMOVAL Left 05/16/2022   Procedure: REMOVAL PORT-A-CATH;  Surgeon: Almond Lint, MD;  Location: [facility];  Service: General;  Laterality: Left;   PORTACATH PLACEMENT Left 04/20/2021   Procedure: INSERTION PORT-A-CATH;  Surgeon: Almond Lint, MD;  Location: [facility];  Service: General;  Laterality: Left;   WISDOM TOOTH EXTRACTION  04/24/1987    Allergies: Allergies  Allergen Reactions   Nitrates, Organic Anaphylaxis    Throat closes   Covid-19 (Mrna) Vaccine     Spikes protein levels     Outpatient Meds: Current Outpatient Medications  Medication Sig Dispense  Refill   Ascorbic Acid (VITAMIN C PO) Take 500 mg by mouth daily.     cholecalciferol (VITAMIN D) 1000 UNITS tablet Take 1,000 Units by mouth daily.     letrozole (FEMARA) 2.5 MG tablet TAKE 1 TABLET BY MOUTH EVERY DAY 90 tablet 3   Multiple Vitamin (MULTIVITAMIN) tablet Take 1 tablet by mouth daily.     fluticasone (FLONASE) 50 MCG/ACT nasal spray Place 1 spray into both nostrils daily as needed for allergies or rhinitis.     loratadine (CLARITIN) 10 MG tablet Take 10 mg by mouth daily as needed for allergies.     Current Facility-Administered Medications  Medication Dose Route Frequency Provider Last Rate Last Admin   0.9 %  sodium chloride infusion  500 mL Intravenous Once Charlie Pitter III, MD          ___________________________________________________________________ Objective   Exam:  BP 122/77   Pulse 70   Temp 98 F (36.7 C) (Temporal)   Ht 5\' 5"  (1.651 m)   Wt 113 lb (51.3 kg)   LMP 12/20/2017   SpO2 99%   BMI 18.80 kg/m   CV: regular , S1/S2 Resp: clear to auscultation bilaterally, normal RR and effort noted GI: soft, no tenderness, with active bowel sounds.   Assessment: Encounter Diagnosis  Name Primary?   History of colon polyps Yes     Plan: Colonoscopy   The benefits and risks of the planned procedure were described in detail with the patient or (when appropriate) their health care proxy.  Risks  were outlined as including, but not limited to, bleeding, infection, perforation, adverse medication reaction leading to cardiac or pulmonary decompensation, pancreatitis (if ERCP).  The limitation of incomplete mucosal visualization was also discussed.  No guarantees or warranties were given.  The patient is appropriate for an endoscopic procedure in the ambulatory setting.   - Amada Jupiter, MD

## 2023-02-19 NOTE — Progress Notes (Signed)
Pt's states no medical or surgical changes since previsit or office visit. 

## 2023-02-19 NOTE — Op Note (Signed)
[facility] Patient Name: Elishah Behen Procedure Date: 02/19/2023 9:44 AM MRN: [id number] Endoscopist: Sherilyn Cooter L. Myrtie Neither , MD, [id number] Age: 57 Referring MD:  Date of Birth: 07/03/1965 Gender: Female Account #: [id number] Procedure:                Colonoscopy Indications:              Surveillance: Personal history of adenomatous                            polyps on last colonoscopy > 5 years ago                           Two SubCM tubular adenomas March 2018 Medicines:                Monitored Anesthesia Care Procedure:                Pre-Anesthesia Assessment:                           - Prior to the procedure, a History and Physical                            was performed, and patient medications and                            allergies were reviewed. The patient's tolerance of                            previous anesthesia was also reviewed. The risks                            and benefits of the procedure and the sedation                            options and risks were discussed with the patient.                            All questions were answered, and informed consent                            was obtained. Prior Anticoagulants: The patient has                            taken no anticoagulant or antiplatelet agents. ASA                            Grade Assessment: II - A patient with mild systemic                            disease. After reviewing the risks and benefits,                            the patient was deemed in satisfactory condition to  undergo the procedure.                           After obtaining informed consent, the colonoscope                            was passed under direct vision. Throughout the                            procedure, the patient's blood pressure, pulse, and                            oxygen saturations were monitored continuously. The                            Olympus Scope SN: [id number] was  introduced through                            the anus and advanced to the the cecum, identified                            by appendiceal orifice and ileocecal valve. The                            colonoscopy was performed without difficulty. The                            patient tolerated the procedure well. The quality                            of the bowel preparation was good. The ileocecal                            valve, appendiceal orifice, and rectum were                            photographed. Scope In: 9:54:49 AM Scope Out: 10:15:13 AM Scope Withdrawal Time: 0 hours 15 minutes 21 seconds  Total Procedure Duration: 0 hours 20 minutes 24 seconds  Findings:                 The perianal and digital rectal examinations were                            normal.                           Repeat examination of right colon under NBI                            performed.                           A 5 mm polyp was found in the ascending colon. The  polyp was semi-sessile. The polyp was removed with                            a cold snare. Resection and retrieval were complete.                           A 15 mm polyp was found in the proximal transverse                            colon. The polyp was semi-sessile. The polyp was                            removed with a piecemeal technique using a cold                            snare. Resection and retrieval were complete.                           A few diverticula were found in the left colon.                           The exam was otherwise without abnormality on                            direct and retroflexion views. Complications:            No immediate complications. Estimated Blood Loss:     Estimated blood loss was minimal. Impression:               - One 5 mm polyp in the ascending colon, removed                            with a cold snare. Resected and retrieved.                           - One  15 mm polyp in the proximal transverse colon,                            removed piecemeal using a cold snare. Resected and                            retrieved.                           - Diverticulosis in the left colon.                           - The examination was otherwise normal on direct                            and retroflexion views. Recommendation:           - Patient has a contact number available for  emergencies. The signs and symptoms of potential                            delayed complications were discussed with the                            patient. Return to normal activities tomorrow.                            Written discharge instructions were provided to the                            patient.                           - Resume previous diet.                           - Continue present medications.                           - Await pathology results.                           - Repeat colonoscopy is recommended for                            surveillance. The colonoscopy date will be                            determined after pathology results from today's                            exam become available for review. Carianna Lague L. Myrtie Neither, MD 02/19/2023 10:19:13 AM This report has been signed electronically.

## 2023-02-19 NOTE — Progress Notes (Signed)
Called to room to assist during endoscopic procedure.  Patient ID and intended procedure confirmed with present staff. Received instructions for my participation in the procedure from the performing physician.  

## 2023-02-19 NOTE — Patient Instructions (Signed)

## 2023-02-19 NOTE — Progress Notes (Signed)
Vss nad trans to pacu 

## 2023-02-20 ENCOUNTER — Telehealth: Payer: Self-pay

## 2023-02-20 ENCOUNTER — Encounter: Payer: Self-pay | Admitting: Hematology and Oncology

## 2023-02-20 NOTE — Telephone Encounter (Signed)
Left message on follow up call. 

## 2023-02-21 ENCOUNTER — Encounter: Payer: Self-pay | Admitting: Gastroenterology

## 2023-02-21 LAB — SURGICAL PATHOLOGY

## 2023-02-25 LAB — SIGNATERA
SIGNATERA MTM READOUT: 0 MTM/ml
SIGNATERA TEST RESULT: NEGATIVE

## 2023-03-01 ENCOUNTER — Telehealth: Payer: Self-pay

## 2023-03-01 NOTE — Telephone Encounter (Signed)
Called pt per MD to advise Signatera testing was negative/not detected. Pt verbalized understanding of results and knows [facility] will be in touch to schedule 3-6 mo repeat lab.

## 2023-03-11 ENCOUNTER — Ambulatory Visit
Admission: RE | Admit: 2023-03-11 | Discharge: 2023-03-11 | Disposition: A | Discharge status: Home / Self Care | Payer: 59 | Source: Ambulatory Visit | Attending: Hematology and Oncology | Admitting: Hematology and Oncology

## 2023-03-11 DIAGNOSIS — Z853 Personal history of malignant neoplasm of breast: Secondary | ICD-10-CM

## 2023-05-01 ENCOUNTER — Encounter: Payer: Self-pay | Admitting: Dermatology

## 2023-05-01 ENCOUNTER — Ambulatory Visit: Payer: 59 | Admitting: Dermatology

## 2023-05-01 VITALS — BP 114/71

## 2023-05-01 DIAGNOSIS — W908XXA Exposure to other nonionizing radiation, initial encounter: Secondary | ICD-10-CM | POA: Diagnosis not present

## 2023-05-01 DIAGNOSIS — Z1283 Encounter for screening for malignant neoplasm of skin: Secondary | ICD-10-CM

## 2023-05-01 DIAGNOSIS — Z85828 Personal history of other malignant neoplasm of skin: Secondary | ICD-10-CM

## 2023-05-01 DIAGNOSIS — L570 Actinic keratosis: Secondary | ICD-10-CM

## 2023-05-01 DIAGNOSIS — D229 Melanocytic nevi, unspecified: Secondary | ICD-10-CM

## 2023-05-01 DIAGNOSIS — L821 Other seborrheic keratosis: Secondary | ICD-10-CM

## 2023-05-01 DIAGNOSIS — D492 Neoplasm of unspecified behavior of bone, soft tissue, and skin: Secondary | ICD-10-CM | POA: Diagnosis not present

## 2023-05-01 DIAGNOSIS — Z853 Personal history of malignant neoplasm of breast: Secondary | ICD-10-CM

## 2023-05-01 DIAGNOSIS — L578 Other skin changes due to chronic exposure to nonionizing radiation: Secondary | ICD-10-CM

## 2023-05-01 DIAGNOSIS — L814 Other melanin hyperpigmentation: Secondary | ICD-10-CM

## 2023-05-01 DIAGNOSIS — C44612 Basal cell carcinoma of skin of right upper limb, including shoulder: Secondary | ICD-10-CM | POA: Diagnosis not present

## 2023-05-01 DIAGNOSIS — Z86018 Personal history of other benign neoplasm: Secondary | ICD-10-CM

## 2023-05-01 DIAGNOSIS — D485 Neoplasm of uncertain behavior of skin: Secondary | ICD-10-CM

## 2023-05-01 DIAGNOSIS — D1801 Hemangioma of skin and subcutaneous tissue: Secondary | ICD-10-CM | POA: Diagnosis not present

## 2023-05-01 NOTE — Progress Notes (Signed)
 Follow-Up Visit   Subjective  Susan Reid is a 58 y.o. female who presents for the following: Skin Cancer Screening and Full Body Skin Exam - History of BCC, History of dysplastic nevus of right foot anterior  The patient presents for Total-Body Skin Exam (TBSE) for skin cancer screening and mole check. The patient has spots, moles and lesions to be evaluated, some may be new or changing and the patient may have concern these could be cancer.    The following portions of the chart were reviewed this encounter and updated as appropriate: medications, allergies, medical history  Review of Systems:  No other skin or systemic complaints except as noted in HPI or Assessment and Plan.  Objective  Well appearing patient in no apparent distress; mood and affect are within normal limits.  A full examination was performed including scalp, head, eyes, ears, nose, lips, neck, chest, axillae, abdomen, back, buttocks, bilateral upper extremities, bilateral lower extremities, hands, feet, fingers, toes, fingernails, and toenails. All findings within normal limits unless otherwise noted below.   Relevant physical exam findings are noted in the Assessment and Plan.  Right Antecubital Fossa 5 mm pearly papule  Left Forearm - Posterior Erythematous thin papules/macules with gritty scale.   Assessment & Plan   SKIN CANCER SCREENING PERFORMED TODAY.  ACTINIC DAMAGE - Chronic condition, secondary to cumulative UV/sun exposure - diffuse scaly erythematous macules with underlying dyspigmentation - Recommend daily broad spectrum sunscreen SPF 30+ to sun-exposed areas, reapply every 2 hours as needed.  - Staying in the shade or wearing long sleeves, sun glasses (UVA+UVB protection) and wide brim hats (4-inch brim around the entire circumference of the hat) are also recommended for sun protection.  - Call for new or changing lesions.  LENTIGINES, SEBORRHEIC KERATOSES, HEMANGIOMAS - Benign normal skin  lesions - Benign-appearing - Call for any changes  MELANOCYTIC NEVI - Tan-brown and/or pink-flesh-colored symmetric macules and papules - Benign appearing on exam today - Observation - Call clinic for new or changing moles - Recommend daily use of broad spectrum spf 30+ sunscreen to sun-exposed areas.   History of Dysplastic Nevi of right foot anterior - No evidence of recurrence today - Recommend regular full body skin exams - Recommend daily broad spectrum sunscreen SPF 30+ to sun-exposed areas, reapply every 2 hours as needed.  - Call if any new or changing lesions are noted between office visits  HISTORY OF BASAL CELL CARCINOMA OF RIGHT SHOULDER - No evidence of recurrence today - Recommend regular full body skin exams - Recommend daily broad spectrum sunscreen SPF 30+ to sun-exposed areas, reapply every 2 hours as needed.  - Call if any new or changing lesions are noted between office visits  History of Breast Cancer of right breast    NEOPLASM OF UNCERTAIN BEHAVIOR OF SKIN Right Antecubital Fossa Skin / nail biopsy Type of biopsy: tangential   Informed consent: discussed and consent obtained   Timeout: patient name, date of birth, surgical site, and procedure verified   Procedure prep:  Patient was prepped and draped in usual sterile fashion Prep type:  Isopropyl alcohol Anesthesia: the lesion was anesthetized in a standard fashion   Anesthetic:  1% lidocaine  w/ epinephrine  1-100,000 buffered w/ 8.4% NaHCO3 Instrument used: flexible razor blade   Hemostasis achieved with: pressure, aluminum chloride and electrodesiccation   Outcome: patient tolerated procedure well   Post-procedure details: sterile dressing applied and wound care instructions given   Dressing type: bandage and petrolatum  AK (ACTINIC KERATOSIS) Left Forearm - Posterior Destruction of lesion - Left Forearm - Posterior Complexity: simple   Destruction method: cryotherapy   Informed consent:  discussed and consent obtained   Timeout:  patient name, date of birth, surgical site, and procedure verified Lesion destroyed using liquid nitrogen: Yes   Region frozen until ice ball extended beyond lesion: Yes   Outcome: patient tolerated procedure well with no complications   Post-procedure details: wound care instructions given     Return in about 1 year (around 04/30/2024) for TBSE History of BCC.  I, Roseline Hutchinson, CMA, am acting as scribe for [employer], DO .   Documentation: I have reviewed the above documentation for accuracy and completeness, and I agree with the above.  Delon Lenis, DO

## 2023-05-01 NOTE — Patient Instructions (Signed)

## 2023-05-02 ENCOUNTER — Other Ambulatory Visit: Payer: Self-pay

## 2023-05-02 LAB — SURGICAL PATHOLOGY

## 2023-05-06 ENCOUNTER — Ambulatory Visit: Payer: 59 | Attending: Hematology and Oncology

## 2023-05-06 ENCOUNTER — Telehealth: Payer: Self-pay

## 2023-05-06 VITALS — Wt 119.2 lb

## 2023-05-06 DIAGNOSIS — Z483 Aftercare following surgery for neoplasm: Secondary | ICD-10-CM | POA: Insufficient documentation

## 2023-05-06 NOTE — Progress Notes (Signed)
 Hi Shirron,  Please call pt and notify their bx results were positive for a skin CA that will be excised by Dr. Caralyn Guile  Diagnosis Skin , right antecubital fossa BASAL CELL CARCINOMA, NODULAR PATTERN

## 2023-05-06 NOTE — Telephone Encounter (Signed)
 PACI, MD 05/22/2023 at 10:30 AM

## 2023-05-06 NOTE — Therapy (Signed)
 OUTPATIENT PHYSICAL THERAPY SOZO SCREENING NOTE   Patient Name: Susan Reid MRN: [id number] DOB:08/24/1965, 58 y.o., female Today's Date: 05/06/2023  PCP: Randeen Laine LABOR, MD REFERRING PROVIDER: Odean Potts, MD   PT End of Session - 05/06/23 1539     Visit Number 11   # unchanged due to screen only   PT Start Time 1536    PT Stop Time 1540    PT Time Calculation (min) 4 min    Activity Tolerance Patient tolerated treatment well    Behavior During Therapy [facility] for tasks assessed/performed             Past Medical History:  Diagnosis Date   Allergy    Cancer (HCC)    Dysplastic nevus 10/29/2022   right anterior foot. Moderate atypia   Family history of adverse reaction to anesthesia    Mother got severe N/V and pneumonia following anesthesia   History of radiation therapy    Right breast- 08/16/21-09/13/21- Dr. 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   BREAST BIOPSY Right 03/14/2021   BREAST LUMPECTOMY Right 02/2021   BREAST LUMPECTOMY WITH SENTINEL LYMPH NODE BIOPSY Right 04/20/2021   Procedure: RIGHT BREAST LUMPECTOMY;  Surgeon: Aron Shoulders, MD;  Location: [facility] OR;  Service: General;  Laterality: Right;   PORT-A-CATH REMOVAL Left 05/16/2022   Procedure: REMOVAL PORT-A-CATH;  Surgeon: Aron Shoulders, MD;  Location:  SURGERY CENTER;  Service: General;  Laterality: Left;   PORTACATH PLACEMENT Left 04/20/2021   Procedure: INSERTION PORT-A-CATH;  Surgeon: Aron Shoulders, MD;  Location: [facility] OR;  Service: General;  Laterality: Left;   WISDOM TOOTH EXTRACTION  04/24/1987   Patient Active Problem List   Diagnosis Date Noted   Allergic rhinitis 12/12/2022   History of colon polyps  12/12/2022   Port-A-Cath in place 05/18/2021   Genetic testing 04/03/2021   Family history of breast cancer 03/23/2021   Malignant neoplasm of upper-outer quadrant of right breast in female, estrogen receptor positive (HCC) 03/20/2021   Osteoporosis 12/10/2018    REFERRING DIAG: right breast cancer at risk for lymphedema  THERAPY DIAG: Aftercare following surgery for neoplasm  PERTINENT HISTORY: Patient was diagnosed on 03/07/2021 with 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 She had a right lumpectomy and senitnel node biopsy (5 negative nodes) on 04/20/2021. It is ER positive, PR negative, and HER2 positive with a Ki67 of 80%.   PRECAUTIONS: right UE Lymphedema risk, None  SUBJECTIVE: Pt returns for her last 3 month L-Dex screen. I had a biopsy on my Rt arm and it's basal. It healed well but she didn't get clear margins so will need to take a little more. I'm glad we're doing these to keep an eye on my lymphedema risk.  PAIN:  Are you having pain? No  SOZO SCREENING: Patient was assessed today using the SOZO machine to determine the lymphedema index score. This was compared to her baseline score. It was determined that she is within the recommended range when compared to her baseline and no further action is needed at this time. She will continue SOZO screenings. These are done every 3 months for 2 years post operatively followed by every 6 months  for 2 years, and then annually.   L-DEX FLOWSHEETS - 05/06/23 1500       L-DEX LYMPHEDEMA SCREENING   Measurement Type Unilateral    L-DEX MEASUREMENT EXTREMITY Upper Extremity    POSITION  Standing    DOMINANT SIDE Right    At Risk Side Right    BASELINE SCORE (UNILATERAL) 2.8    L-DEX SCORE (UNILATERAL) -3.8    VALUE CHANGE (UNILAT) -6.6            P: Begin 6 month L-Dex screens next.    Aden Berwyn Caldron, PTA 05/06/2023, 3:40 PM

## 2023-05-06 NOTE — Telephone Encounter (Signed)
-----   Message from Delon Lenis sent at 05/06/2023  8:43 AM EST ----- Hi Judye Lorino,  Please call pt and notify their bx results were positive for a skin CA that will be excised by Dr. Corey  Diagnosis Skin , right antecubital fossa BASAL CELL CARCINOMA, NODULAR PATTERN

## 2023-05-07 ENCOUNTER — Encounter: Payer: Self-pay | Admitting: Hematology and Oncology

## 2023-05-08 ENCOUNTER — Other Ambulatory Visit: Payer: Self-pay | Admitting: *Deleted

## 2023-05-08 DIAGNOSIS — Z170 Estrogen receptor positive status [ER+]: Secondary | ICD-10-CM

## 2023-05-09 ENCOUNTER — Inpatient Hospital Stay: Payer: 59 | Attending: Hematology and Oncology | Admitting: Hematology and Oncology

## 2023-05-09 ENCOUNTER — Inpatient Hospital Stay: Payer: 59

## 2023-05-09 VITALS — BP 126/68 | HR 75 | Temp 97.4°F | Resp 18 | Ht 65.0 in | Wt 119.5 lb

## 2023-05-09 DIAGNOSIS — Z170 Estrogen receptor positive status [ER+]: Secondary | ICD-10-CM | POA: Diagnosis not present

## 2023-05-09 DIAGNOSIS — C50411 Malignant neoplasm of upper-outer quadrant of right female breast: Secondary | ICD-10-CM | POA: Diagnosis not present

## 2023-05-09 DIAGNOSIS — Z79811 Long term (current) use of aromatase inhibitors: Secondary | ICD-10-CM | POA: Diagnosis not present

## 2023-05-09 DIAGNOSIS — M810 Age-related osteoporosis without current pathological fracture: Secondary | ICD-10-CM | POA: Diagnosis not present

## 2023-05-09 DIAGNOSIS — Z95828 Presence of other vascular implants and grafts: Secondary | ICD-10-CM

## 2023-05-09 LAB — CBC WITH DIFFERENTIAL (CANCER CENTER ONLY)
Abs Immature Granulocytes: 0.02 10*3/uL (ref 0.00–0.07)
Basophils Absolute: 0 10*3/uL (ref 0.0–0.1)
Basophils Relative: 1 %
Eosinophils Absolute: 0.1 10*3/uL (ref 0.0–0.5)
Eosinophils Relative: 2 %
HCT: 41 % (ref 36.0–46.0)
Hemoglobin: 13.6 g/dL (ref 12.0–15.0)
Immature Granulocytes: 0 %
Lymphocytes Relative: 29 %
Lymphs Abs: 1.7 10*3/uL (ref 0.7–4.0)
MCH: 30.2 pg (ref 26.0–34.0)
MCHC: 33.2 g/dL (ref 30.0–36.0)
MCV: 90.9 fL (ref 80.0–100.0)
Monocytes Absolute: 0.4 10*3/uL (ref 0.1–1.0)
Monocytes Relative: 7 %
Neutro Abs: 3.4 10*3/uL (ref 1.7–7.7)
Neutrophils Relative %: 61 %
Platelet Count: 259 10*3/uL (ref 150–400)
RBC: 4.51 MIL/uL (ref 3.87–5.11)
RDW: 12.6 % (ref 11.5–15.5)
WBC Count: 5.6 10*3/uL (ref 4.0–10.5)
nRBC: 0 % (ref 0.0–0.2)

## 2023-05-09 LAB — CMP (CANCER CENTER ONLY)
ALT: 17 U/L (ref 0–44)
AST: 22 U/L (ref 15–41)
Albumin: 4.7 g/dL (ref 3.5–5.0)
Alkaline Phosphatase: 67 U/L (ref 38–126)
Anion gap: 6 (ref 5–15)
BUN: 15 mg/dL (ref 6–20)
CO2: 30 mmol/L (ref 22–32)
Calcium: 9.8 mg/dL (ref 8.9–10.3)
Chloride: 104 mmol/L (ref 98–111)
Creatinine: 0.91 mg/dL (ref 0.44–1.00)
GFR, Estimated: >60 mL/min (ref >60)
Glucose, Bld: 93 mg/dL (ref 70–99)
Potassium: 4 mmol/L (ref 3.5–5.1)
Sodium: 140 mmol/L (ref 135–145)
Total Bilirubin: 0.4 mg/dL (ref 0.0–1.2)
Total Protein: 7 g/dL (ref 6.5–8.1)

## 2023-05-09 MED ORDER — ZOLEDRONIC ACID 4 MG/100ML IV SOLN
4.0000 mg | Freq: Once | INTRAVENOUS | Status: AC
Start: 2023-05-09 — End: 2023-05-09
  Administered 2023-05-09: 4 mg via INTRAVENOUS
  Filled 2023-05-09: qty 100

## 2023-05-09 MED ORDER — SODIUM CHLORIDE 0.9 % IV SOLN: Freq: Once | INTRAVENOUS | Status: AC

## 2023-05-09 NOTE — Assessment & Plan Note (Signed)
03/14/2021:Palpable right breast mass diagnostic mammogram: 1.6 cm mass in the right breast at 9 o'clock. 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  04/20/21: Rt Lumpectomy: Grade 3 IDC 0/5 Ln neg, Margins Neg, Her2+, Copy #5.85, ratio 3.16 ER+(60%)/PR+(0%), Ki-67 80%.   Treatment Plan: 1. adjuvant chemotherapy with Taxol Herceptin followed by Herceptin maintenance started 05/18/2021 2.  Adjuvant radiation therapy completed 09/13/2021 3.  Adjuvant antiestrogen therapy with letrozole started 10/05/2021 Zometa infusion every 6 months x2 years --------------------------------------------------------------------------------------------------------------------------------------------------- Current treatment: Herceptin (until January 2024), letrozole started 10/05/2021  Bladder issues: Seeing urology.  Significantly better.   Letrozole toxicities: Denies any adverse effects to letrozole Osteoporosis: Patient has a T score of -2.7 in the back and this [facility].  Zometa every 6 months for 2 years.    Signatera: Negative   Patient requested that she only see me for follow-up.   Breast cancer surveillance: Mammogram 03/11/2023: Benign breast density category D Breast exam 05/09/2023: Benign  Return to clinic in 1 year for follow-up

## 2023-05-09 NOTE — Patient Instructions (Signed)

## 2023-05-09 NOTE — Progress Notes (Signed)
Patient Care Team: Tower, Audrie Gallus, MD as PCP - General (Family Medicine) Cherlyn Roberts, MD as Consulting Physician (Dermatology) Almond Lint, MD as Consulting Physician (General Surgery) Serena Croissant, MD as Consulting Physician (Hematology and Oncology) Antony Blackbird, MD as Consulting Physician (Radiation Oncology)  DIAGNOSIS:  Encounter Diagnosis  Name Primary?   Malignant neoplasm of upper-outer quadrant of right breast in female, estrogen receptor positive (HCC) Yes    SUMMARY OF ONCOLOGIC HISTORY: Oncology History  Malignant neoplasm of upper-outer quadrant of right breast in female, estrogen receptor positive (HCC)  03/14/2021 Initial Diagnosis   Palpable right breast mass diagnostic mammogram: 1.6 cm mass in the right breast at 9 o'clock. 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    03/22/2021 Cancer Staging   Staging form: Breast, AJCC 8th Edition - Clinical stage from 03/22/2021: Stage IA (cT1b, cN0, cM0, G3, ER+, PR-, HER2+) - Signed by Serena Croissant, MD on 03/22/2021 Stage prefix: Initial diagnosis Histologic grading system: 3 grade system    Genetic Testing   [facility] CustomNext Panel was Negative. Report date is 04/03/2021.  The CustomNext-Cancer+RNAinsight panel offered by [employer] includes sequencing and rearrangement analysis for the following 47 genes:  APC, ATM, AXIN2, BARD1, BMPR1A, BRCA1, BRCA2, BRIP1, CDH1, CDK4, CDKN2A, CHEK2, CTNNA1, DICER1, EPCAM, GREM1, HOXB13, KIT, MEN1, MLH1, MSH2, MSH3, MSH6, MUTYH, NBN, NF1, NTHL1, PALB2, PDGFRA, PMS2, POLD1, POLE, PTEN, RAD50, RAD51C, RAD51D, SDHA, SDHB, SDHC, SDHD, SMAD4, SMARCA4, STK11, TP53, TSC1, TSC2, and VHL.  RNA data is routinely analyzed for use in variant interpretation for all genes.   04/20/2021 Surgery   Rt Lumpectomy: Grade 3 IDC 1.9 cm 0/5 Ln neg, Margins Neg, Her2+, Copy #5.85, ratio 3.16 ER+(60%)/PR+(0%), Ki-67 80%   04/20/2021  Cancer Staging   Staging form: Breast, AJCC 8th Edition - Pathologic stage from 04/20/2021: Stage IA (pT1c, pN0, cM0, G3, ER+, PR-, HER2+) - Signed by Loa Socks, NP on 06/30/2021 Stage prefix: Initial diagnosis Histologic grading system: 3 grade system   05/18/2021 - 12/08/2021 Chemotherapy   Patient is on Treatment Plan : BREAST Paclitaxel + Trastuzumab q7d / Trastuzumab q21d     05/18/2021 -  Chemotherapy   Patient is on Treatment Plan : BREAST Paclitaxel + Trastuzumab q7d / Trastuzumab q21d     08/16/2021 - 09/13/2021 Radiation Therapy   Site Technique Total Dose (Gy) Dose per Fx (Gy) Completed Fx Beam Energies  Breast, Right: Breast_R 3D 40.05/40.05 2.67 15/15 10X  Breast, Right: Breast_R_Bst 3D 12/12 2 6/6 6X, 10X     10/05/2021 -  Anti-estrogen oral therapy   Letrozole x 5-7 years     CHIEF COMPLIANT: Follow-up on letrozole therapy, Zometa  HISTORY OF PRESENT ILLNESS: Ms. Julson is a 58 year old with above-mentioned history of breast cancer is currently on letrozole and is tolerating it fairly well.  She does have muscle aches and pains but denies any hot flashes.  Denies any lumps or nodules in the breast.    She has been receiving Zometa for osteoporosis.  Today is her fourth treatment.     ALLERGIES:  is allergic to nitrates, organic and covid-19 (mrna) vaccine.  MEDICATIONS:  Current Outpatient Medications  Medication Sig Dispense Refill   Ascorbic Acid (VITAMIN C PO) Take 500 mg by mouth daily.     cholecalciferol (VITAMIN D) 1000 UNITS tablet Take 1,000 Units by mouth daily.     fluticasone (FLONASE) 50 MCG/ACT nasal spray Place 1 spray into both nostrils daily as  needed for allergies or rhinitis.     letrozole (FEMARA) 2.5 MG tablet TAKE 1 TABLET BY MOUTH EVERY DAY 90 tablet 3   loratadine (CLARITIN) 10 MG tablet Take 10 mg by mouth daily as needed for allergies.     Multiple Vitamin (MULTIVITAMIN) tablet Take 1 tablet by mouth daily.     No current  facility-administered medications for this visit.    PHYSICAL EXAMINATION: ECOG PERFORMANCE STATUS: 1 - Symptomatic but completely ambulatory  Vitals:   05/09/23 1425  BP: 126/68  Pulse: 75  Resp: 18  Temp: (!) 97.4 F (36.3 C)  SpO2: 100%   Filed Weights   05/09/23 1425  Weight: 119 lb 8 oz (54.2 kg)    Physical Exam          (exam performed in the presence of a chaperone)  LABORATORY DATA:  I have reviewed the data as listed    Latest Ref Rng & Units 05/09/2023    1:22 PM 11/06/2022    8:52 AM 05/08/2022    8:21 AM  CMP  Glucose 70 - 99 mg/dL 93  73  71   BUN 6 - 20 mg/dL 15  18  18    Creatinine 0.44 - 1.00 mg/dL 5.78  4.69  6.29   Sodium 135 - 145 mmol/L 140  143  142   Potassium 3.5 - 5.1 mmol/L 4.0  4.0  4.2   Chloride 98 - 111 mmol/L 104  105  107   CO2 22 - 32 mmol/L 30  31  29    Calcium 8.9 - 10.3 mg/dL 9.8  52.8  9.7   Total Protein 6.5 - 8.1 g/dL 7.0  6.9  6.8   Total Bilirubin 0.0 - 1.2 mg/dL 0.4  0.5  0.5   Alkaline Phos 38 - 126 U/L 67  64  56   AST 15 - 41 U/L 22  21  20    ALT 0 - 44 U/L 17  17  15      Lab Results  Component Value Date   WBC 5.6 05/09/2023   HGB 13.6 05/09/2023   HCT 41.0 05/09/2023   MCV 90.9 05/09/2023   PLT 259 05/09/2023   NEUTROABS 3.4 05/09/2023    ASSESSMENT & PLAN:  Malignant neoplasm of upper-outer quadrant of right breast in female, estrogen receptor positive (HCC) 03/14/2021:Palpable right breast mass diagnostic mammogram: 1.6 cm mass in the right breast at 9 o'clock. 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  04/20/21: Rt Lumpectomy: Grade 3 IDC 0/5 Ln neg, Margins Neg, Her2+, Copy #5.85, ratio 3.16 ER+(60%)/PR+(0%), Ki-67 80%.   Treatment Plan: 1. adjuvant chemotherapy with Taxol Herceptin followed by Herceptin maintenance started 05/18/2021 2.  Adjuvant radiation therapy completed 09/13/2021 3.  Adjuvant antiestrogen therapy with letrozole started  10/05/2021 Zometa infusion every 6 months x2 years --------------------------------------------------------------------------------------------------------------------------------------------------- Current treatment: Herceptin (until January 2024), letrozole started 10/05/2021 Osteoporosis: She tells me that [facility].  This will conclude her Zometa infusions.   Letrozole toxicities: Denies any adverse effects to letrozole Osteoporosis: Patient has a T score of -2.7 in the back and this was [facility] and [facility].  Zometa every 6 months for 2 years.    Signatera: Negative   Patient requested that she only see me for follow-up.   Breast cancer surveillance: Mammogram 03/11/2023: Benign breast density category D.  From next year we will do contrast-enhanced mammograms. Breast  exam 05/09/2023: Benign  Return to clinic in 1 year for follow-up   No orders of the defined types were placed in this encounter.  The patient has a good understanding of the overall plan. she agrees with it. she will call with any problems that may develop before the next visit here. Total time spent: 30 mins including face to face time and time spent for planning, charting and co-ordination of care   Tamsen Meek, MD 05/09/23

## 2023-05-12 ENCOUNTER — Other Ambulatory Visit: Payer: Self-pay

## 2023-05-13 ENCOUNTER — Encounter: Payer: Self-pay | Admitting: Hematology and Oncology

## 2023-05-13 NOTE — Telephone Encounter (Signed)
Patient need to reschedeule for later day.

## 2023-05-14 ENCOUNTER — Other Ambulatory Visit: Payer: Self-pay

## 2023-05-21 ENCOUNTER — Encounter: Payer: Self-pay | Admitting: Dermatology

## 2023-05-22 ENCOUNTER — Other Ambulatory Visit: Payer: Self-pay | Admitting: Pharmacist

## 2023-05-22 ENCOUNTER — Ambulatory Visit (INDEPENDENT_AMBULATORY_CARE_PROVIDER_SITE_OTHER): Payer: 59 | Admitting: Dermatology

## 2023-05-22 ENCOUNTER — Encounter: Payer: Self-pay | Admitting: Hematology and Oncology

## 2023-05-22 ENCOUNTER — Encounter: Payer: Self-pay | Admitting: Dermatology

## 2023-05-22 ENCOUNTER — Telehealth: Payer: Self-pay | Admitting: *Deleted

## 2023-05-22 VITALS — BP 136/83 | HR 68

## 2023-05-22 DIAGNOSIS — C44612 Basal cell carcinoma of skin of right upper limb, including shoulder: Secondary | ICD-10-CM | POA: Diagnosis not present

## 2023-05-22 DIAGNOSIS — C4491 Basal cell carcinoma of skin, unspecified: Secondary | ICD-10-CM

## 2023-05-22 NOTE — Telephone Encounter (Signed)
Received bone density report from [facility] showing T score -2.5.  Per MD pt needing to continue IV Zometa 4 mg infusion q 6 months x 2 years. Pt notified and verbalized understanding.  Scheduling message sent to add appt 11/06/23 as well as 05/14/24.

## 2023-05-22 NOTE — Progress Notes (Signed)
Follow-Up Visit   Subjective  Susan Reid is a 58 y.o. female who presents for the following: Excision of a nodular BCC on the right antecubital fossa, biopsied by Dr. Onalee Hua.  The following portions of the chart were reviewed this encounter and updated as appropriate: medications, allergies, medical history  Review of Systems:  No other skin or systemic complaints except as noted in HPI or Assessment and Plan.  Objective  Well appearing patient in no apparent distress; mood and affect are within normal limits.  A focused examination was performed of the following areas:  Right arm  Relevant physical exam findings are noted in the Assessment and Plan.     Assessment & Plan   BASAL CELL CARCINOMA (BCC), UNSPECIFIED SITE Right Antecubital Fossa Skin excision  Excision method:  elliptical Lesion length (cm):  0.7 Lesion width (cm):  0.7 Margin per side (cm):  0.4 Total excision diameter (cm):  1.5 Informed consent: discussed and consent obtained   Timeout: patient name, date of birth, surgical site, and procedure verified   Procedure prep:  Patient was prepped and draped in usual sterile fashion Prep type:  Chlorhexidine Anesthesia: the lesion was anesthetized in a standard fashion   Anesthetic:  1% lidocaine w/ epinephrine 1-100,000 buffered w/ 8.4% NaHCO3 Instrument used: #15 blade   Hemostasis achieved with: suture, pressure and electrodesiccation   Hemostasis achieved with comment:  4.0 PDS with dermabond and steri strips Outcome: patient tolerated procedure with difficulty   Post-procedure details: sterile dressing applied and wound care instructions given   Dressing type: petrolatum, bandage and pressure dressing    Skin repair Complexity:  Complex Final length (cm):  4.2 Informed consent: discussed and consent obtained   Timeout: patient name, date of birth, surgical site, and procedure verified   Procedure prep:  Patient was prepped and draped in usual  sterile fashion Prep type:  Chlorhexidine Anesthesia: the lesion was anesthetized in a standard fashion   Anesthetic:  1% lidocaine w/ epinephrine 1-100,000 buffered w/ 8.4% NaHCO3 Reason for type of repair: reduce tension to allow closure, allow closure of the large defect, preserve normal anatomy and avoid adjacent structures   Undermining: area extensively undermined   Subcutaneous layers (deep stitches):  Suture size:  4-0 Suture type: PDS (polydioxanone)   Stitches:  Buried vertical mattress Fine/surface layer approximation (top stitches):  Suture type: cyanoacrylate tissue glue   Hemostasis achieved with: suture, pressure and electrodesiccation Outcome: patient tolerated procedure well with no complications   Post-procedure details: sterile dressing applied and wound care instructions given   Dressing type: bandage, petrolatum and pressure dressing   Specimen 1 - Surgical pathology Differential Diagnosis: BCC [id number] Check Margins: No  The surgical wound was then cleaned, prepped, and re-anesthetized as above. Wound edges were undermined extensively along at least one entire edge and at a distance equal to or greater than the width of the defect (see wound defect size above) in order to achieve closure and decrease wound tension and anatomic distortion. Redundant tissue repair including standing cone removal was performed. Hemostasis was achieved with electrocautery. Subcutaneous and epidermal tissues were approximated with the above sutures. The surgical site was then lightly scrubbed with sterile, saline-soaked gauze. Steri-strips were applied, and the area was then bandaged using Vaseline ointment, non-adherent gauze, gauze pads, and tape to provide an adequate pressure dressing. The patient tolerated the procedure well, was given detailed written and verbal wound care instructions, and was discharged in good condition.   The patient  will follow-up: PRN.  Return if symptoms  worsen or fail to improve.   Documentation: I have reviewed the above documentation for accuracy and completeness, and I agree with the above.  Gwenith Daily, MD

## 2023-05-22 NOTE — Patient Instructions (Signed)
Important Information   Due to recent changes in healthcare laws, you may see results of your pathology and/or laboratory studies on MyChart before the doctors have had a chance to review them. We understand that in some cases there may be results that are confusing or concerning to you. Please understand that not all results are received at the same time and often the doctors may need to interpret multiple results in order to provide you with the best plan of care or course of treatment. Therefore, we ask that you please give [place] 2 business days to thoroughly review all your results before contacting the office for clarification. Should we see a critical lab result, you will be contacted sooner.     If You Need Anything After Your Visit   If you have any questions or concerns for your doctor, please call our main line at [phone]. If no one answers, please leave a voicemail as directed and we will return your call as soon as possible. Messages left after 4 pm will be answered the following business day.    You may also send [place] a message via MyChart. We typically respond to MyChart messages within 1-2 business days.  For prescription refills, please ask your pharmacy to contact our office. Our fax number is [phone].  If you have an urgent issue when the clinic is closed that cannot wait until the next business day, you can page your doctor at the number below.     Please note that while we do our best to be available for urgent issues outside of office hours, we are not available 24/7.    If you have an urgent issue and are unable to reach [place], you may choose to seek medical care at your doctor's office, retail clinic, urgent care center, or emergency room.   If you have a medical emergency, please immediately call 911 or go to the emergency department. In the event of inclement weather, please call our main line at [phone] for an update on the status of any delays or  closures.  Dermatology Medication Tips: Please keep the boxes that topical medications come in in order to help keep track of the instructions about where and how to use these. Pharmacies typically print the medication instructions only on the boxes and not directly on the medication tubes.   If your medication is too expensive, please contact our office at [phone] or send [place] a message through MyChart.    We are unable to tell what your co-pay for medications will be in advance as this is different depending on your insurance coverage. However, we may be able to find a substitute medication at lower cost or fill out paperwork to get insurance to cover a needed medication.    If a prior authorization is required to get your medication covered by your insurance company, please allow [place] 1-2 business days to complete this process.   Drug prices often vary depending on where the prescription is filled and some pharmacies may offer cheaper prices.   The website www.goodrx.com contains coupons for medications through different pharmacies. The prices here do not account for what the cost may be with help from insurance (it may be cheaper with your insurance), but the website can give you the price if you did not use any insurance.  - You can print the associated coupon and take it with your prescription to the pharmacy.  - You may also stop by our office during regular  business hours and pick up a GoodRx coupon card.  - If you need your prescription sent electronically to a different pharmacy, notify our office through [facility] or by phone at [phone]

## 2023-05-24 ENCOUNTER — Encounter: Payer: Self-pay | Admitting: Obstetrics and Gynecology

## 2023-05-27 LAB — SURGICAL PATHOLOGY

## 2023-05-30 ENCOUNTER — Encounter: Payer: Self-pay | Admitting: Hematology and Oncology

## 2023-06-22 ENCOUNTER — Other Ambulatory Visit: Payer: Self-pay

## 2023-07-15 ENCOUNTER — Other Ambulatory Visit: Payer: Self-pay

## 2023-07-23 ENCOUNTER — Other Ambulatory Visit: Payer: Self-pay | Admitting: *Deleted

## 2023-07-23 DIAGNOSIS — Z170 Estrogen receptor positive status [ER+]: Secondary | ICD-10-CM

## 2023-07-23 NOTE — Progress Notes (Signed)
 Orders placed for [employer] Renewal.

## 2023-08-07 LAB — SIGNATERA
SIGNATERA MTM READOUT: 0 MTM/ml
SIGNATERA TEST RESULT: NEGATIVE

## 2023-09-10 ENCOUNTER — Other Ambulatory Visit: Payer: Self-pay | Admitting: Hematology and Oncology

## 2023-11-01 ENCOUNTER — Inpatient Hospital Stay: Payer: 59

## 2023-11-01 ENCOUNTER — Other Ambulatory Visit: Payer: Self-pay | Admitting: *Deleted

## 2023-11-01 ENCOUNTER — Inpatient Hospital Stay: Payer: 59 | Attending: Hematology and Oncology

## 2023-11-01 VITALS — BP 121/67 | HR 63 | Temp 98.4°F | Resp 16 | Wt 118.5 lb

## 2023-11-01 DIAGNOSIS — M810 Age-related osteoporosis without current pathological fracture: Secondary | ICD-10-CM | POA: Insufficient documentation

## 2023-11-01 DIAGNOSIS — Z95828 Presence of other vascular implants and grafts: Secondary | ICD-10-CM

## 2023-11-01 LAB — CBC WITH DIFFERENTIAL (CANCER CENTER ONLY)
Abs Immature Granulocytes: 0.02 K/uL (ref 0.00–0.07)
Basophils Absolute: 0 K/uL (ref 0.0–0.1)
Basophils Relative: 1 %
Eosinophils Absolute: 0.1 K/uL (ref 0.0–0.5)
Eosinophils Relative: 2 %
HCT: 42.5 % (ref 36.0–46.0)
Hemoglobin: 14 g/dL (ref 12.0–15.0)
Immature Granulocytes: 0 %
Lymphocytes Relative: 24 %
Lymphs Abs: 1.4 K/uL (ref 0.7–4.0)
MCH: 30.1 pg (ref 26.0–34.0)
MCHC: 32.9 g/dL (ref 30.0–36.0)
MCV: 91.4 fL (ref 80.0–100.0)
Monocytes Absolute: 0.4 K/uL (ref 0.1–1.0)
Monocytes Relative: 8 %
Neutro Abs: 3.7 K/uL (ref 1.7–7.7)
Neutrophils Relative %: 65 %
Platelet Count: 240 K/uL (ref 150–400)
RBC: 4.65 MIL/uL (ref 3.87–5.11)
RDW: 12.9 % (ref 11.5–15.5)
WBC Count: 5.7 K/uL (ref 4.0–10.5)
nRBC: 0 % (ref 0.0–0.2)

## 2023-11-01 LAB — CMP (CANCER CENTER ONLY)
ALT: 19 U/L (ref 0–44)
AST: 21 U/L (ref 15–41)
Albumin: 4.6 g/dL (ref 3.5–5.0)
Alkaline Phosphatase: 67 U/L (ref 38–126)
Anion gap: 7 (ref 5–15)
BUN: 14 mg/dL (ref 6–20)
CO2: 29 mmol/L (ref 22–32)
Calcium: 9.9 mg/dL (ref 8.9–10.3)
Chloride: 106 mmol/L (ref 98–111)
Creatinine: 0.75 mg/dL (ref 0.44–1.00)
GFR, Estimated: >60 mL/min (ref >60)
Glucose, Bld: 89 mg/dL (ref 70–99)
Potassium: 4.1 mmol/L (ref 3.5–5.1)
Sodium: 142 mmol/L (ref 135–145)
Total Bilirubin: 0.5 mg/dL (ref 0.0–1.2)
Total Protein: 7.2 g/dL (ref 6.5–8.1)

## 2023-11-01 MED ORDER — SODIUM CHLORIDE 0.9 % IV SOLN
INTRAVENOUS | Status: DC
Start: 2023-11-01 — End: 2023-11-01

## 2023-11-01 MED ORDER — ZOLEDRONIC ACID 4 MG/100ML IV SOLN
4.0000 mg | Freq: Once | INTRAVENOUS | Status: AC
Start: 2023-11-01 — End: 2023-11-01
  Administered 2023-11-01: 4 mg via INTRAVENOUS
  Filled 2023-11-01: qty 100

## 2023-11-01 NOTE — Patient Instructions (Signed)

## 2023-11-04 ENCOUNTER — Ambulatory Visit: Payer: 59 | Attending: Hematology and Oncology

## 2023-11-04 VITALS — Wt 118.1 lb

## 2023-11-04 DIAGNOSIS — Z483 Aftercare following surgery for neoplasm: Secondary | ICD-10-CM | POA: Insufficient documentation

## 2023-11-04 NOTE — Therapy (Signed)
 OUTPATIENT PHYSICAL THERAPY SOZO SCREENING NOTE   Patient Name: Susan Reid MRN: [id number] DOB:1965/12/29, 58 y.o., female Today's Date: 11/04/2023  PCP: Randeen Laine LABOR, MD REFERRING PROVIDER: Odean Potts, MD   PT End of Session - 11/04/23 1527     Visit Number 11   # unchanged due to screen only   PT Start Time 1525    PT Stop Time 1529    PT Time Calculation (min) 4 min    Activity Tolerance Patient tolerated treatment well    Behavior During Therapy [facility] for tasks assessed/performed          Past Medical History:  Diagnosis Date   Allergy    Cancer (HCC)    Dysplastic nevus 10/29/2022   right anterior foot. Moderate atypia   Family history of adverse reaction to anesthesia    Mother got severe N/V and pneumonia following anesthesia   History of radiation therapy    Right breast- 08/16/21-09/13/21- Dr. 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   BREAST BIOPSY Right 03/14/2021   BREAST LUMPECTOMY Right 02/2021   BREAST LUMPECTOMY WITH SENTINEL LYMPH NODE BIOPSY Right 04/20/2021   Procedure: RIGHT BREAST LUMPECTOMY;  Surgeon: Aron Shoulders, MD;  Location: [facility] OR;  Service: General;  Laterality: Right;   PORT-A-CATH REMOVAL Left 05/16/2022   Procedure: REMOVAL PORT-A-CATH;  Surgeon: Aron Shoulders, MD;  Location: [facility];  Service: General;  Laterality: Left;   PORTACATH PLACEMENT Left 04/20/2021   Procedure: INSERTION PORT-A-CATH;  Surgeon: Aron Shoulders, MD;  Location: [facility] OR;  Service: General;  Laterality: Left;   WISDOM TOOTH EXTRACTION  04/24/1987   Patient Active Problem List   Diagnosis Date Noted   Allergic rhinitis 12/12/2022   History of colon polyps 12/12/2022    Port-A-Cath in place 05/18/2021   Genetic testing 04/03/2021   Family history of breast cancer 03/23/2021   Malignant neoplasm of upper-outer quadrant of right breast in female, estrogen receptor positive (HCC) 03/20/2021   Osteoporosis 12/10/2018    REFERRING DIAG: right breast cancer at risk for lymphedema  THERAPY DIAG: Aftercare following surgery for neoplasm  PERTINENT HISTORY: Patient was diagnosed on 03/07/2021 with 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 She had a right lumpectomy and senitnel node biopsy (5 negative nodes) on 04/20/2021. It is ER positive, PR negative, and HER2 positive with a Ki67 of 80%.   PRECAUTIONS: right UE Lymphedema risk, None  SUBJECTIVE: Pt returns for her first 6 month L-Dex screen.   PAIN:  Are you having pain? No  SOZO SCREENING: Patient was assessed today using the SOZO machine to determine the lymphedema index score. This was compared to her baseline score. It was determined that she is within the recommended range when compared to her baseline and no further action is needed at this time. She will continue SOZO screenings. These are done every 3 months for 2 years post operatively followed by every 6 months for 2 years, and then annually.   L-DEX FLOWSHEETS - 11/04/23 1500       L-DEX LYMPHEDEMA SCREENING   Measurement Type Unilateral    L-DEX MEASUREMENT EXTREMITY Upper Extremity    POSITION  Standing  DOMINANT SIDE Right    At Risk Side Right    BASELINE SCORE (UNILATERAL) 2.8    L-DEX SCORE (UNILATERAL) -1    VALUE CHANGE (UNILAT) -3.8         P: Cont 6 month L-Dex screens.    Aden Berwyn Caldron, PTA 11/04/2023, 3:28 PM

## 2023-11-19 ENCOUNTER — Encounter: Payer: Self-pay | Admitting: Dermatology

## 2023-11-19 ENCOUNTER — Ambulatory Visit: Payer: 59 | Admitting: Dermatology

## 2023-11-19 VITALS — BP 122/78 | HR 66

## 2023-11-19 DIAGNOSIS — W908XXA Exposure to other nonionizing radiation, initial encounter: Secondary | ICD-10-CM | POA: Diagnosis not present

## 2023-11-19 DIAGNOSIS — D229 Melanocytic nevi, unspecified: Secondary | ICD-10-CM

## 2023-11-19 DIAGNOSIS — L814 Other melanin hyperpigmentation: Secondary | ICD-10-CM

## 2023-11-19 DIAGNOSIS — D1801 Hemangioma of skin and subcutaneous tissue: Secondary | ICD-10-CM | POA: Diagnosis not present

## 2023-11-19 DIAGNOSIS — D2272 Melanocytic nevi of left lower limb, including hip: Secondary | ICD-10-CM

## 2023-11-19 DIAGNOSIS — D485 Neoplasm of uncertain behavior of skin: Secondary | ICD-10-CM

## 2023-11-19 DIAGNOSIS — Z1283 Encounter for screening for malignant neoplasm of skin: Secondary | ICD-10-CM

## 2023-11-19 DIAGNOSIS — Z85828 Personal history of other malignant neoplasm of skin: Secondary | ICD-10-CM

## 2023-11-19 DIAGNOSIS — L570 Actinic keratosis: Secondary | ICD-10-CM

## 2023-11-19 DIAGNOSIS — Z86018 Personal history of other benign neoplasm: Secondary | ICD-10-CM

## 2023-11-19 DIAGNOSIS — D492 Neoplasm of unspecified behavior of bone, soft tissue, and skin: Secondary | ICD-10-CM

## 2023-11-19 DIAGNOSIS — L578 Other skin changes due to chronic exposure to nonionizing radiation: Secondary | ICD-10-CM

## 2023-11-19 DIAGNOSIS — L821 Other seborrheic keratosis: Secondary | ICD-10-CM

## 2023-11-19 MED ORDER — SAFETY SEAL MISCELLANEOUS MISC: 1.0000 | Freq: Every day | 8 refills | Status: DC

## 2023-11-19 NOTE — Patient Instructions (Addendum)

## 2023-11-19 NOTE — Progress Notes (Signed)
 Total Body Skin Exam (TBSE) Visit   Subjective  Susan Reid is a 58 y.o. female who presents for the following: Skin Cancer Screening and Full Body Skin Exam  Patient presents today for follow up visit for TBSE. Patient was last evaluated on 05/01/2023. Patient denies medication changes. Patient reports she does have spots, moles and lesions of concern to be evaluated. Patient reports throughout her lifetime she has had moderate sun exposure. Currently, patient reports if she has excessive sun exposure, she does apply sunscreen and/or wears protective coverings. Patient reports she has hx of bx (DN Right Foot, BCC Right antecubital Fossa). Patient denies  family history of skin cancers. The patient has spots, moles and lesions to be evaluated, some may be new or changing and the patient has concerns that these could be cancer.  The following portions of the chart were reviewed this encounter and updated as appropriate: medications, allergies, medical history  Review of Systems:  No other skin or systemic complaints except as noted in HPI or Assessment and Plan.  Objective  Well appearing patient in no apparent distress; mood and affect are within normal limits.  A full examination was performed including scalp, head, eyes, ears, nose, lips, neck, chest, axillae, abdomen, back, buttocks, bilateral upper extremities, bilateral lower extremities, hands, feet, fingers, toes, fingernails, and toenails. All findings within normal limits unless otherwise noted below.   Relevant physical exam findings are noted in the Assessment and Plan.       Right Upper Cutaneous Lip Erythematous thin papules/macules with gritty scale.  Left Lower Leg - Posterior 3 mm irregular dark brown Macule with irregular border  Assessment & Plan   LENTIGINES, SEBORRHEIC KERATOSES, HEMANGIOMAS - Benign normal skin lesions - Benign-appearing - Call for any changes  MELANOCYTIC NEVI - Tan-brown and/or  pink-flesh-colored symmetric macules and papules - Benign appearing on exam today - Observation - Call clinic for new or changing moles - Recommend daily use of broad spectrum spf 30+ sunscreen to sun-exposed areas.   ACTINIC DAMAGE - Chronic condition, secondary to cumulative UV/sun exposure - diffuse scaly erythematous macules with underlying dyspigmentation - Recommend daily broad spectrum sunscreen SPF 30+ to sun-exposed areas, reapply every 2 hours as needed.  - Staying in the shade or wearing long sleeves, sun glasses (UVA+UVB protection) and wide brim hats (4-inch brim around the entire circumference of the hat) are also recommended for sun protection.  - Call for new or changing lesions.  History of Dysplastic Nevi RIGHT FOOT - No evidence of recurrence today - Recommend regular full body skin exams - Recommend daily broad spectrum sunscreen SPF 30+ to sun-exposed areas, reapply every 2 hours as needed.  - Call if any new or changing lesions are noted between office visits  HISTORY OF BASAL CELL CARCINOMA OF THE SKIN LOCATED ON THE RIGHT ANTECUBITAL FOSSA - No evidence of recurrence today - Recommend regular full body skin exams - Recommend daily broad spectrum sunscreen SPF 30+ to sun-exposed areas, reapply every 2 hours as needed.  - Call if any new or changing lesions are noted between office visits  ACTINIC KERATOSIS Exam: Erythematous thin papules/macules with gritty scale at the Right upper cutaneous lip  Actinic keratoses are precancerous spots that appear secondary to cumulative UV radiation exposure/sun exposure over time. They are chronic with expected duration over 1 year. A portion of actinic keratoses will progress to squamous cell carcinoma of the skin. It is not possible to reliably predict which spots will  progress to skin cancer and so treatment is recommended to prevent development of skin cancer.  Recommend daily broad spectrum sunscreen SPF 30+ to sun-exposed  areas, reapply every 2 hours as needed.  Recommend staying in the shade or wearing long sleeves, sun glasses (UVA+UVB protection) and wide brim hats (4-inch brim around the entire circumference of the hat). Call for new or changing lesions.  Treatment Plan: - Cryo Therapy completed while in office today  SKIN CANCER SCREENING PERFORMED TODAY.  AK (ACTINIC KERATOSIS) Right Upper Cutaneous Lip Destruction of lesion - Right Upper Cutaneous Lip Complexity: simple   Destruction method: cryotherapy   Informed consent: discussed and consent obtained   Timeout:  patient name, date of birth, surgical site, and procedure verified Lesion destroyed using liquid nitrogen: Yes   Cryotherapy cycles:  1 Post-procedure details: wound care instructions given    NEOPLASM OF UNCERTAIN BEHAVIOR OF SKIN Left Lower Leg - Posterior Epidermal / dermal shaving  Lesion diameter (cm):  0.3 Informed consent: discussed and consent obtained   Timeout: patient name, date of birth, surgical site, and procedure verified   Procedure prep:  Patient was prepped and draped in usual sterile fashion Prep type:  Isopropyl alcohol Anesthesia: the lesion was anesthetized in a standard fashion   Anesthetic:  1% lidocaine  w/ epinephrine  1-100,000 buffered w/ 8.4% NaHCO3 Instrument used: DermaBlade   Hemostasis achieved with: aluminum chloride   Outcome: patient tolerated procedure well   Post-procedure details: sterile dressing applied and wound care instructions given   Dressing type: petrolatum   Additional details:  Pt aware that benign results will be sent to mychart and the staff will call abnormal results will  Specimen A - Surgical pathology Differential Diagnosis: R/O DN  Check Margins: No  Return in about 1 year (around 11/18/2024) for TBSE.  I, Jetta Ager, am acting as Neurosurgeon for [employer], DO.  Documentation: I have reviewed the above documentation for accuracy and completeness, and I agree with the  above.  Delon Lenis, DO

## 2023-11-20 ENCOUNTER — Other Ambulatory Visit: Payer: Self-pay

## 2023-11-20 ENCOUNTER — Encounter: Payer: Self-pay | Admitting: Dermatology

## 2023-11-21 ENCOUNTER — Encounter: Payer: Self-pay | Admitting: Dermatology

## 2023-11-26 LAB — SURGICAL PATHOLOGY

## 2023-11-27 ENCOUNTER — Ambulatory Visit: Payer: Self-pay | Admitting: Dermatology

## 2023-12-25 ENCOUNTER — Encounter: Payer: Self-pay | Admitting: Dermatology

## 2023-12-26 NOTE — Telephone Encounter (Signed)
 Please let her know that the photos look like a benign SK.  But She can be added to a 12:30 slot.   It won't be next week but it may be a little sooner than january

## 2024-01-14 ENCOUNTER — Encounter: Payer: Self-pay | Admitting: Hematology and Oncology

## 2024-02-06 LAB — SIGNATERA ONLY (NATERA MANAGED)
SIGNATERA MTM READOUT: 0 MTM/ml
SIGNATERA TEST RESULT: NEGATIVE

## 2024-02-20 ENCOUNTER — Ambulatory Visit: Admitting: Dermatology

## 2024-02-20 ENCOUNTER — Encounter: Payer: Self-pay | Admitting: Dermatology

## 2024-02-20 VITALS — BP 102/60 | HR 77

## 2024-02-20 DIAGNOSIS — D485 Neoplasm of uncertain behavior of skin: Secondary | ICD-10-CM | POA: Diagnosis not present

## 2024-02-20 DIAGNOSIS — C44319 Basal cell carcinoma of skin of other parts of face: Secondary | ICD-10-CM

## 2024-02-20 NOTE — Progress Notes (Signed)
   Follow-Up Visit   Subjective  Susan Reid is a 58 y.o. female who presents for the following: Sk and Spot Check  Patient present today for follow up visit for Sk and Spot check. Patient was last evaluated on 11/19/23. At this visit patient had spots frozen. She would like them re-treated. Patient reports sxs are unchanged. Patient denies medication changes.  The following portions of the chart were reviewed this encounter and updated as appropriate: medications, allergies, medical history  Review of Systems:  No other skin or systemic complaints except as noted in HPI or Assessment and Plan.  Objective  Well appearing patient in no apparent distress; mood and affect are within normal limits.  A focused examination was performed of the following areas: Above upper lip and Right Nose  Relevant exam findings are noted in the Assessment and Plan.            Right Upper Cutaneous Lip 3 mm pink pearly papule  Assessment & Plan    Suspicious skin lesion of face,   The pink papular skin lesion on the right cutaneous lip has become raised, raising concerns about a possible BCC. The lesion is located on the edge of a previously treated area. Given her hx of basal cell carcinoma, recurrence or other pathology must be ruled out.  - Perform a conservative biopsy of the skin lesion on the right cutaneous lip to rule BCC or other pathology. NEOPLASM OF UNCERTAIN BEHAVIOR OF SKIN Right Upper Cutaneous Lip Epidermal / dermal shaving  Lesion diameter (cm):  0.3 Informed consent: discussed and consent obtained   Timeout: patient name, date of birth, surgical site, and procedure verified   Procedure prep:  Patient was prepped and draped in usual sterile fashion Prep type:  Isopropyl alcohol Anesthesia: the lesion was anesthetized in a standard fashion   Anesthetic:  1% lidocaine  w/ epinephrine  1-100,000 buffered w/ 8.4% NaHCO3 Instrument used: DermaBlade   Hemostasis achieved with:  aluminum chloride   Outcome: patient tolerated procedure well   Post-procedure details: sterile dressing applied and wound care instructions given   Dressing type: petrolatum   Additional details:  Pt aware that benign results will be sent to mychart and the staff will call abnormal results will  Specimen 1 - Surgical pathology Differential Diagnosis: R/O BCC  Check Margins: No  Return for Schedule appt.  I, Jetta Ager, am acting as neurosurgeon for [employer], DO.  Documentation: I have reviewed the above documentation for accuracy and completeness, and I agree with the above.  Delon Lenis, DO

## 2024-02-20 NOTE — Patient Instructions (Signed)

## 2024-02-24 LAB — SURGICAL PATHOLOGY

## 2024-02-25 ENCOUNTER — Ambulatory Visit: Payer: Self-pay | Admitting: Dermatology

## 2024-02-25 DIAGNOSIS — C4491 Basal cell carcinoma of skin, unspecified: Secondary | ICD-10-CM

## 2024-02-25 NOTE — Progress Notes (Signed)
 HI Shirron,  Please call patient and notify that results of biopsy were positive for a skin cancer that needs to be treated with Mohs Surgery.  We will refer to Dr Corey  FINAL DIAGNOSIS        1. Skin, right upper cutaneous lip :       BASAL CELL CARCINOMA, NODULAR PATTERN

## 2024-03-04 ENCOUNTER — Encounter: Payer: Self-pay | Admitting: Dermatology

## 2024-03-04 DIAGNOSIS — C4491 Basal cell carcinoma of skin, unspecified: Secondary | ICD-10-CM | POA: Insufficient documentation

## 2024-03-04 NOTE — Telephone Encounter (Signed)
 Pt has been informed of results and expressed understanding.   Scheduling message sent to front desk.

## 2024-03-11 ENCOUNTER — Other Ambulatory Visit: Payer: Self-pay | Admitting: Hematology and Oncology

## 2024-03-11 ENCOUNTER — Inpatient Hospital Stay
Admission: RE | Admit: 2024-03-11 | Discharge: 2024-03-11 | Disposition: A | Source: Ambulatory Visit | Attending: Hematology and Oncology | Admitting: Hematology and Oncology

## 2024-03-11 DIAGNOSIS — C50411 Malignant neoplasm of upper-outer quadrant of right female breast: Secondary | ICD-10-CM

## 2024-03-11 MED ORDER — IOPAMIDOL (ISOVUE-370) INJECTION 76%
100.0000 mL | Freq: Once | INTRAVENOUS | Status: AC | PRN
  Administered 2024-03-11: 100 mL via INTRAVENOUS

## 2024-03-18 ENCOUNTER — Encounter: Payer: Self-pay | Admitting: Hematology and Oncology

## 2024-04-09 ENCOUNTER — Encounter: Payer: Self-pay | Admitting: Dermatology

## 2024-04-09 ENCOUNTER — Ambulatory Visit: Admitting: Dermatology

## 2024-04-09 VITALS — BP 120/64 | HR 85 | Temp 98.0°F

## 2024-04-09 DIAGNOSIS — C44319 Basal cell carcinoma of skin of other parts of face: Secondary | ICD-10-CM

## 2024-04-09 DIAGNOSIS — L814 Other melanin hyperpigmentation: Secondary | ICD-10-CM | POA: Diagnosis not present

## 2024-04-09 DIAGNOSIS — C4491 Basal cell carcinoma of skin, unspecified: Secondary | ICD-10-CM

## 2024-04-09 DIAGNOSIS — L578 Other skin changes due to chronic exposure to nonionizing radiation: Secondary | ICD-10-CM

## 2024-04-09 NOTE — Progress Notes (Signed)
 Follow-Up Visit   Subjective  Susan Reid is a 58 y.o. 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 Biopsy was performed on 02/20/2024 by Delon Lenis, MD.    The following portions of the chart were reviewed this encounter and updated as appropriate: medications, allergies, medical history  Review of Systems:  No other skin or systemic complaints except as noted in HPI or Assessment and Plan.  Pt reports lesion has been present for four months. Pt denies pain and itchiness.   Objective  Well appearing patient in no apparent distress; mood and affect are within normal limits.  A focused examination was performed of the following areas: Right upper cutaneous lip Relevant physical exam findings are noted in the Assessment and Plan.   Right Upper Cutaneous Lip Pink nodule   Assessment & Plan   BASAL CELL CARCINOMA (BCC), UNSPECIFIED SITE Right Upper Cutaneous Lip - Mohs surgery  Consent obtained: written  Anticoagulation: Is the patient taking prescription anticoagulant and/or aspirin prescribed/recommended by a physician? No   Was the anticoagulation regimen changed prior to Mohs? No    Anesthesia: Anesthesia method: local infiltration  Procedure Details: Timeout: pre-procedure verification complete Procedure Prep: patient was prepped and draped in usual sterile fashion Prep type: chlorhexidine  Biopsy accession number: [id number] Biopsy lab: [facility] Date of biopsy: 02/20/2024 Frozen section biopsy performed: No   Specimen debulked: No   Pre-Op diagnosis: basal cell carcinoma BCC subtype: nodular MohsAIQ Surgical site (if tumor spans multiple areas, please select predominant area): cutaneous lip Surgery side: right Surgical site (from skin exam): Right Upper Cutaneous Lip Pre-operative length (cm): 0.5 Pre-operative width (cm): 0.4 Indications for Mohs surgery: anatomic location where  tissue conservation is critical Previously treated? No    Micrographic Surgery Details: Post-operative length (cm): 0.8 Post-operative width (cm): 0.7 Number of Mohs stages: 1 Post surgery depth of defect: subcutaneous fat  Stage 1    Tumor features identified on Mohs section: no tumor identified    Depth of tumor invasion after stage: subcutaneous fat  Patient tolerance of procedure: tolerated well, no immediate complications  Reconstruction: Was the defect reconstructed? Yes   Was reconstruction performed by the same Mohs surgeon? Yes   Setting of reconstruction: outpatient office When was reconstruction performed? same day Type of reconstruction: linear Linear reconstruction: complex  Opioids: Did the patient receive a prescription for opioid/narcotic related to Mohs surgery?: No    Antibiotics: Does patient meet AHA guidelines for endocarditis?: No   Does patient meet AHA guidelines for orthopedic prophylaxis?: No   Were antibiotics given on the day of surgery?: No   Did surgery breach mucosa, expose cartilage/bone, involve an area of lymphedema/inflamed/infected tissue? No    - Skin repair Complexity:  Complex Final length (cm):  2.8 Informed consent: discussed and consent obtained   Timeout: patient name, date of birth, surgical site, and procedure verified   Procedure prep:  Patient was prepped and draped in usual sterile fashion Prep type:  Chlorhexidine  Anesthesia: the lesion was anesthetized in a standard fashion   Anesthetic:  1% lidocaine  w/ epinephrine  1-100,000 buffered w/ 8.4% NaHCO3 Reason for type of repair: reduce the risk of dehiscence, infection, and necrosis, avoid adjacent structures and allow side-to-side closure without requiring a flap or graft   Undermining: area extensively undermined   Subcutaneous layers (deep stitches):  Suture size:  5-0 Suture type: Monocryl (poliglecaprone 25)   Stitches:  Buried vertical mattress  Fine/surface layer  approximation (top stitches):  Suture size:  6-0 Suture type: fast-absorbing plain gut   Stitches: simple running   Hemostasis achieved with: suture, pressure and electrodesiccation Outcome: patient tolerated procedure well with no complications   Post-procedure details: sterile dressing applied and wound care instructions given   Dressing type: pressure dressing and petrolatum      Return in about 1 month (around 05/10/2024) for Follow Up.  I, Rollene Gobble, RN, am acting as scribe for Susan CHRISTELLA HOLY, MD .   04/09/2024  HISTORY OF PRESENT ILLNESS  Susan Reid is seen in consultation at the request of Dr. Alm for biopsy-proven Nodular Basal Cell Carcinoma of the right upper cutaneous lip. They note that the area has been present for about 4 months increasing in size with time.  There is no history of previous treatment.  Reports no other new or changing lesions and has no other complaints today.  Medications and allergies: see patient chart.  Review of systems: Reviewed 8 systems and notable for the above skin cancer.  All other systems reviewed are unremarkable/negative, unless noted in the HPI. Past medical history, surgical history, family history, social history were also reviewed and are noted in the chart/questionnaire.    PHYSICAL EXAMINATION  General: Well-appearing, in no acute distress, alert and oriented x 4. Vitals reviewed in chart (if available).   Skin: Exam reveals a 0.5 x 0.4 cm erythematous papule and biopsy scar on the right upper cutaneous lip. There are rhytids, telangiectasias, and lentigines, consistent with photodamage.  Biopsy report(s) reviewed, confirming the diagnosis.   ASSESSMENT  1) Nodular Basal Cell Carcinoma of the right upper cutaneous lip 2) photodamage 3) solar lentigines   PLAN   1. Due to location, size, histology, or recurrence and the likelihood of subclinical extension as well as the need to conserve normal surrounding tissue,  the patient was deemed acceptable for Mohs micrographic surgery (MMS).  The nature and purpose of the procedure, associated benefits and risks including recurrence and scarring, possible complications such as pain, infection, and bleeding, and alternative methods of treatment if appropriate were discussed with the patient during consent. The lesion location was verified by the patient, by reviewing previous notes, pathology reports, and by photographs as well as angulation measurements if available.  Informed consent was reviewed and signed by the patient, and timeout was performed at 9:00 AM. See op note below.  2. For the photodamage and solar lentigines, sun protection discussed/information given on OTC sunscreens, and we recommend continued regular follow-up with primary dermatologist every 6 months or sooner for any growing, bleeding, or changing lesions. 3. Prognosis and future surveillance discussed. 4. Letter with treatment outcome sent to referring provider. 5. Pain acetaminophen /ibuprofen  MOHS MICROGRAPHIC SURGERY AND RECONSTRUCTION  Initial size:   0.5 x 0.4 cm Surgical defect/wound size: 0.8 x 0.7 cm Anesthesia:    0.33% lidocaine  with 1:200,000 epinephrine  EBL:    <5 mL Complications:  None Repair type:   Complex SQ suture:   5-0 Monocryl Cutaneous suture:  6-0 Plain gut Final size of the repair: 2.8 cm  Stages: 1  STAGE I: Anesthesia achieved with 0.5% lidocaine  with 1:200,000 epinephrine . ChloraPrep applied. 1 section(s) excised using Mohs technique (this includes total peripheral and deep tissue margin excision and evaluation with frozen sections, excised and interpreted by the same physician). The tumor was first debulked and then excised with an approx. 2mm margin.  Hemostasis was achieved with electrocautery as needed.  The specimen  was then oriented, subdivided/relaxed, inked, and processed using Mohs technique.    Frozen section analysis revealed a clear deep and  peripheral margin.  Reconstruction  The surgical wound was then cleaned, prepped, and re-anesthetized as above. Wound edges were undermined extensively along at least one entire edge and at a distance equal to or greater than the width of the defect (see wound defect size above) in order to achieve closure and decrease wound tension and anatomic distortion. Redundant tissue repair including standing cone removal was performed. Hemostasis was achieved with electrocautery. Subcutaneous and epidermal tissues were approximated with the above sutures. The surgical site was then lightly scrubbed with sterile, saline-soaked gauze. The area was then bandaged using Vaseline ointment, non-adherent gauze, gauze pads, and tape to provide an adequate pressure dressing. The patient tolerated the procedure well, was given detailed written and verbal wound care instructions, and was discharged in good condition.   The patient will follow-up: 4 weeks.     Documentation: I have reviewed the above documentation for accuracy and completeness, and I agree with the above.  Susan CHRISTELLA HOLY, MD

## 2024-04-09 NOTE — Patient Instructions (Signed)

## 2024-04-23 ENCOUNTER — Encounter: Payer: Self-pay | Admitting: Hematology and Oncology

## 2024-04-29 ENCOUNTER — Encounter: Payer: Self-pay | Admitting: Hematology and Oncology

## 2024-04-30 ENCOUNTER — Encounter: Payer: Self-pay | Admitting: Hematology and Oncology

## 2024-04-30 ENCOUNTER — Ambulatory Visit: Payer: 59 | Admitting: Dermatology

## 2024-04-30 VITALS — BP 119/67

## 2024-04-30 DIAGNOSIS — Z1283 Encounter for screening for malignant neoplasm of skin: Secondary | ICD-10-CM | POA: Diagnosis not present

## 2024-04-30 DIAGNOSIS — L821 Other seborrheic keratosis: Secondary | ICD-10-CM

## 2024-04-30 DIAGNOSIS — L814 Other melanin hyperpigmentation: Secondary | ICD-10-CM

## 2024-04-30 DIAGNOSIS — L689 Hypertrichosis, unspecified: Secondary | ICD-10-CM

## 2024-04-30 DIAGNOSIS — D229 Melanocytic nevi, unspecified: Secondary | ICD-10-CM

## 2024-04-30 DIAGNOSIS — D1801 Hemangioma of skin and subcutaneous tissue: Secondary | ICD-10-CM

## 2024-04-30 DIAGNOSIS — W908XXA Exposure to other nonionizing radiation, initial encounter: Secondary | ICD-10-CM | POA: Diagnosis not present

## 2024-04-30 DIAGNOSIS — Z85828 Personal history of other malignant neoplasm of skin: Secondary | ICD-10-CM | POA: Diagnosis not present

## 2024-04-30 DIAGNOSIS — L578 Other skin changes due to chronic exposure to nonionizing radiation: Secondary | ICD-10-CM

## 2024-04-30 NOTE — Patient Instructions (Addendum)

## 2024-04-30 NOTE — Progress Notes (Signed)
" ° °  Follow-Up Visit   Subjective  Susan Reid is a 59 y.o. female who presents for the following: Skin Cancer Screening and Full Body Skin Exam - History of BCC - she had Mohs to right upper cutaneous lip 04/09/2024 with Dr Corey  The patient presents for Total-Body Skin Exam (TBSE) for skin cancer screening and mole check. The patient has spots, moles and lesions to be evaluated, some may be new or changing and the patient may have concern these could be cancer.    The following portions of the chart were reviewed this encounter and updated as appropriate: medications, allergies, medical history  Review of Systems:  No other skin or systemic complaints except as noted in HPI or Assessment and Plan.  Objective  Well appearing patient in no apparent distress; mood and affect are within normal limits.  A full examination was performed including scalp, head, eyes, ears, nose, lips, neck, chest, axillae, abdomen, back, buttocks, bilateral upper extremities, bilateral lower extremities, hands, feet, fingers, toes, fingernails, and toenails. All findings within normal limits unless otherwise noted below.   Relevant physical exam findings are noted in the Assessment and Plan.    Assessment & Plan   SKIN CANCER SCREENING PERFORMED TODAY.  ACTINIC DAMAGE - Chronic condition, secondary to cumulative UV/sun exposure - diffuse scaly erythematous macules with underlying dyspigmentation - Recommend daily broad spectrum sunscreen SPF 30+ to sun-exposed areas, reapply every 2 hours as needed.  - Staying in the shade or wearing long sleeves, sun glasses (UVA+UVB protection) and wide brim hats (4-inch brim around the entire circumference of the hat) are also recommended for sun protection.  - Call for new or changing lesions.  LENTIGINES, SEBORRHEIC KERATOSES, HEMANGIOMAS - Benign normal skin lesions - Benign-appearing - Call for any changes  MELANOCYTIC NEVI - Tan-brown and/or  pink-flesh-colored symmetric macules and papules - Benign appearing on exam today - Observation - Call clinic for new or changing moles - Recommend daily use of broad spectrum spf 30+ sunscreen to sun-exposed areas.   HISTORY OF BASAL CELL CARCINOMA OF THE SKIN - No evidence of recurrence today - Recommend regular full body skin exams - Recommend daily broad spectrum sunscreen SPF 30+ to sun-exposed areas, reapply every 2 hours as needed.  - Call if any new or changing lesions are noted between office visits   HYPERTRICHOSIS OF CHIN   Treatment Plan: Recommend electric facial shaver    Return for Follow up as scheduled with Dr Corey, 1 year FBSE with Dr Alm.  I, Roseline Hutchinson, CMA, am acting as scribe for [employer], DO .   Documentation: I have reviewed the above documentation for accuracy and completeness, and I agree with the above.  Delon Alm, DO    "

## 2024-05-01 ENCOUNTER — Encounter: Payer: Self-pay | Admitting: Dermatology

## 2024-05-04 ENCOUNTER — Ambulatory Visit: Payer: Self-pay | Attending: Hematology and Oncology

## 2024-05-04 VITALS — Wt 117.1 lb

## 2024-05-04 DIAGNOSIS — Z483 Aftercare following surgery for neoplasm: Secondary | ICD-10-CM | POA: Insufficient documentation

## 2024-05-04 NOTE — Therapy (Signed)
 " OUTPATIENT PHYSICAL THERAPY SOZO SCREENING NOTE   Patient Name: Susan Reid MRN: [id number] DOB:12-10-1965, 59 y.o., female Today's Date: 05/04/2024  PCP: Randeen Laine LABOR, MD REFERRING PROVIDER: Odean Potts, MD   PT End of Session - 05/04/24 1530     Visit Number 11   # unchanged due to screen only   PT Start Time 1528    PT Stop Time 1532    PT Time Calculation (min) 4 min    Activity Tolerance Patient tolerated treatment well    Behavior During Therapy [facility] for tasks assessed/performed          Past Medical History:  Diagnosis Date   Allergy    BCC (basal cell carcinoma of skin) 03/04/2024   right upper cutaneous lip - MOHS needed    Cancer (HCC)    Dysplastic nevus 10/29/2022   right anterior foot. Moderate atypia   Family history of adverse reaction to anesthesia    Mother got severe N/V and pneumonia following anesthesia   History of radiation therapy    Right breast- 08/16/21-09/13/21- Dr. 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   BREAST BIOPSY Right 03/14/2021   BREAST LUMPECTOMY Right 02/2021   BREAST LUMPECTOMY WITH SENTINEL LYMPH NODE BIOPSY Right 04/20/2021   Procedure: RIGHT BREAST LUMPECTOMY;  Surgeon: Aron Shoulders, MD;  Location: [facility] OR;  Service: General;  Laterality: Right;   PORT-A-CATH REMOVAL Left 05/16/2022   Procedure: REMOVAL PORT-A-CATH;  Surgeon: Aron Shoulders, MD;  Location: [facility];  Service: General;  Laterality: Left;   PORTACATH PLACEMENT Left 04/20/2021   Procedure: INSERTION PORT-A-CATH;  Surgeon: Aron Shoulders, MD;  Location: [facility] OR;  Service: General;  Laterality: Left;   WISDOM TOOTH EXTRACTION  04/24/1987   Patient Active Problem List    Diagnosis Date Noted   BCC (basal cell carcinoma of skin) 03/04/2024   Allergic rhinitis 12/12/2022   History of colon polyps 12/12/2022   Lymphedema of right upper extremity 11/06/2021   Port-A-Cath in place 05/18/2021   Genetic testing 04/03/2021   Family history of breast cancer 03/23/2021   Malignant neoplasm of upper-outer quadrant of right breast in female, estrogen receptor positive (HCC) 03/20/2021   Osteoporosis 12/10/2018    REFERRING DIAG: right breast cancer at risk for lymphedema  THERAPY DIAG: Aftercare following surgery for neoplasm  PERTINENT HISTORY: Patient was diagnosed on 03/07/2021 with 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 She had a right lumpectomy and senitnel node biopsy (5 negative nodes) on 04/20/2021. It is ER positive, PR negative, and HER2 positive with a Ki67 of 80%.   PRECAUTIONS: right UE Lymphedema risk, None  SUBJECTIVE: Pt returns for her 6 month L-Dex screen.   PAIN:  Are you having pain? No  SOZO SCREENING: Patient was assessed today using the SOZO machine to determine the lymphedema index score. This was compared to her baseline score. It was determined that she is within the recommended range when compared to her baseline and no further action is needed at this time. She will continue SOZO screenings. These are done every 3 months for 2 years post operatively followed by every 6 months for 2 years, and then annually.  L-DEX FLOWSHEETS - 05/04/24 1500       L-DEX LYMPHEDEMA SCREENING   Measurement Type Unilateral    L-DEX MEASUREMENT EXTREMITY Upper Extremity    POSITION  Standing    DOMINANT SIDE Right    At Risk Side Right    BASELINE SCORE (UNILATERAL) 2.8    L-DEX SCORE (UNILATERAL) -0.7    VALUE CHANGE (UNILAT) -3.5         P: Cont 6 month L-Dex screens until 03/2025.    Aden Berwyn Caldron, PTA 05/04/2024, 3:32 PM    "

## 2024-05-11 ENCOUNTER — Ambulatory Visit: Payer: 59 | Admitting: Hematology and Oncology

## 2024-05-12 ENCOUNTER — Encounter: Payer: Self-pay | Admitting: Hematology and Oncology

## 2024-05-12 ENCOUNTER — Encounter: Payer: Self-pay | Admitting: Dermatology

## 2024-05-12 ENCOUNTER — Ambulatory Visit: Payer: Self-pay | Admitting: Dermatology

## 2024-05-12 DIAGNOSIS — L905 Scar conditions and fibrosis of skin: Secondary | ICD-10-CM

## 2024-05-12 DIAGNOSIS — C4491 Basal cell carcinoma of skin, unspecified: Secondary | ICD-10-CM

## 2024-05-12 DIAGNOSIS — Z85828 Personal history of other malignant neoplasm of skin: Secondary | ICD-10-CM | POA: Diagnosis not present

## 2024-05-12 NOTE — Patient Instructions (Addendum)

## 2024-05-12 NOTE — Progress Notes (Signed)
" ° °  Follow Up Visit   Subjective  Susan Reid is a 59 y.o. female who presents for the following: follow up from Mohs surgery   The patient presents for follow up from Mohs surgery for a BCC on the right upper cutaneous lip, treated on 04/09/24, repaired with linear closure. The patient has been bandaging the wound as directed. The endorse the following concerns: none  The following portions of the chart were reviewed this encounter and updated as appropriate: medications, allergies, medical history  Review of Systems:  No other skin or systemic complaints except as noted in HPI or Assessment and Plan.  Objective  Well appearing patient in no apparent distress; mood and affect are within normal limits.  A focal examination was performed including scalp, head, face. All findings within normal limits unless otherwise noted below.   Healing wound with mild erythema  Relevant physical exam findings are noted in the Assessment and Plan.    Assessment & Plan    Scar s/p Mohs for [facility] on the right upper cutaneous lip, treated on 04/09/24 repaired with linear closure - Reassured that wound is healing well - No evidence of infection - No swelling, induration, purulence, dehiscence, or tenderness out of proportion to the clinical exam, see photo above - Discussed that scars take up to 12 months to mature from the date of surgery - Recommend SPF 30+ to scar daily to prevent purple color from UV exposure during scar maturation process - Discussed that erythema and raised appearance of scar will fade over the next 4-6 months - OK to start scar massage at 4-6 weeks post-op - Can consider silicone based products for scar healing starting at 6 weeks post-op  HISTORY OF BASAL CELL CARCINOMA OF THE SKIN - No evidence of recurrence today - Recommend regular full body skin exams - Recommend daily broad spectrum sunscreen SPF 30+ to sun-exposed areas, reapply every 2 hours as needed.  - Call if  any new or changing lesions are noted between office visits  Return if symptoms worsen or fail to improve.  I, Darice Smock, CMA, am acting as scribe for [name], MD.   Documentation: I have reviewed the above documentation for accuracy and completeness, and I agree with the above.  [name], MD  "

## 2024-05-13 ENCOUNTER — Other Ambulatory Visit: Payer: Self-pay

## 2024-05-13 DIAGNOSIS — Z170 Estrogen receptor positive status [ER+]: Secondary | ICD-10-CM

## 2024-05-14 ENCOUNTER — Inpatient Hospital Stay: Payer: 59 | Attending: Hematology and Oncology | Admitting: Hematology and Oncology

## 2024-05-14 ENCOUNTER — Ambulatory Visit: Payer: 59

## 2024-05-14 ENCOUNTER — Inpatient Hospital Stay: Payer: 59

## 2024-05-14 VITALS — BP 119/67 | HR 79 | Temp 97.7°F | Resp 16 | Ht 65.0 in | Wt 118.9 lb

## 2024-05-14 DIAGNOSIS — Z170 Estrogen receptor positive status [ER+]: Secondary | ICD-10-CM | POA: Diagnosis not present

## 2024-05-14 DIAGNOSIS — Z95828 Presence of other vascular implants and grafts: Secondary | ICD-10-CM

## 2024-05-14 DIAGNOSIS — C50411 Malignant neoplasm of upper-outer quadrant of right female breast: Secondary | ICD-10-CM | POA: Diagnosis not present

## 2024-05-14 LAB — CMP (CANCER CENTER ONLY)
ALT: 22 U/L (ref 0–44)
AST: 26 U/L (ref 15–41)
Albumin: 4.8 g/dL (ref 3.5–5.0)
Alkaline Phosphatase: 72 U/L (ref 38–126)
Anion gap: 12 (ref 5–15)
BUN: 14 mg/dL (ref 6–20)
CO2: 26 mmol/L (ref 22–32)
Calcium: 9.8 mg/dL (ref 8.9–10.3)
Chloride: 104 mmol/L (ref 98–111)
Creatinine: 0.72 mg/dL (ref 0.44–1.00)
GFR, Estimated: >60 mL/min
Glucose, Bld: 116 mg/dL — ABNORMAL HIGH (ref 70–99)
Potassium: 3.9 mmol/L (ref 3.5–5.1)
Sodium: 142 mmol/L (ref 135–145)
Total Bilirubin: 0.4 mg/dL (ref 0.0–1.2)
Total Protein: 7.4 g/dL (ref 6.5–8.1)

## 2024-05-14 LAB — CBC WITH DIFFERENTIAL (CANCER CENTER ONLY)
Abs Immature Granulocytes: 0.01 K/uL (ref 0.00–0.07)
Basophils Absolute: 0 K/uL (ref 0.0–0.1)
Basophils Relative: 1 %
Eosinophils Absolute: 0.2 K/uL (ref 0.0–0.5)
Eosinophils Relative: 3 %
HCT: 43.1 % (ref 36.0–46.0)
Hemoglobin: 14.1 g/dL (ref 12.0–15.0)
Immature Granulocytes: 0 %
Lymphocytes Relative: 25 %
Lymphs Abs: 1.5 K/uL (ref 0.7–4.0)
MCH: 29.9 pg (ref 26.0–34.0)
MCHC: 32.7 g/dL (ref 30.0–36.0)
MCV: 91.3 fL (ref 80.0–100.0)
Monocytes Absolute: 0.4 K/uL (ref 0.1–1.0)
Monocytes Relative: 6 %
Neutro Abs: 3.8 K/uL (ref 1.7–7.7)
Neutrophils Relative %: 65 %
Platelet Count: 278 K/uL (ref 150–400)
RBC: 4.72 MIL/uL (ref 3.87–5.11)
RDW: 12.7 % (ref 11.5–15.5)
WBC Count: 5.9 K/uL (ref 4.0–10.5)
nRBC: 0 % (ref 0.0–0.2)

## 2024-05-14 MED ORDER — ZOLEDRONIC ACID 4 MG/100ML IV SOLN
4.0000 mg | Freq: Once | INTRAVENOUS | Status: AC
  Administered 2024-05-14: 4 mg via INTRAVENOUS
  Filled 2024-05-14: qty 100

## 2024-05-14 MED ORDER — SODIUM CHLORIDE 0.9 % IV SOLN: INTRAVENOUS | Status: DC

## 2024-05-14 NOTE — Assessment & Plan Note (Signed)
 05/14/2020:Palpable right breast mass diagnostic mammogram: 1.6 cm mass in the right breast at 9 o'clock. 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  04/20/21: Rt Lumpectomy: Grade 3 IDC 0/5 Ln neg, Margins Neg, Her2+, Copy #5.85, ratio 3.16 ER+(60%)/PR+(0%), Ki-67 80%.   Treatment Plan: 1. adjuvant chemotherapy with Taxol  Herceptin  followed by Herceptin  maintenance started 05/18/2021 2.  Adjuvant radiation therapy completed 09/13/2021 3.  Adjuvant antiestrogen therapy with letrozole  started 10/05/2021 Zometa  infusion every 6 months x2 years --------------------------------------------------------------------------------------------------------------------------------------------------- Current treatment: Herceptin  (until January 2024), letrozole  started 10/05/2021 Osteoporosis: She tells me that the [facility] showed [facility].  This will conclude her Zometa  infusions.   Letrozole  toxicities: Denies any adverse effects to letrozole  Osteoporosis: Patient has a T score of -2.7 in the back and this was done and 2021 at [facility].  Zometa  every 6 months for 2 years.     Signatera: Negative   Patient requested that she only see me for follow-up.   Breast cancer surveillance: Contrast-enhanced mammogram 03/11/2024: Benign breast density category D.   Breast exam 05/14/2024: Benign   Return to clinic in 1 year for follow-up

## 2024-05-14 NOTE — Progress Notes (Signed)
 "  Patient Care Team: Tower, Laine LABOR, MD as PCP - General (Family Medicine) Ivin Kocher, MD as Consulting Physician (Dermatology) Aron Shoulders, MD as Consulting Physician (General Surgery) Odean Potts, MD as Consulting Physician (Hematology and Oncology) Shannon Agent, MD as Consulting Physician (Radiation Oncology)  DIAGNOSIS:  Encounter Diagnosis  Name Primary?   Malignant neoplasm of upper-outer quadrant of right breast in female, estrogen receptor positive (HCC) Yes    SUMMARY OF ONCOLOGIC HISTORY: Oncology History  Malignant neoplasm of upper-outer quadrant of right breast in female, estrogen receptor positive (HCC)  03/14/2021 Initial Diagnosis   Palpable right breast mass diagnostic mammogram: 1.6 cm mass in the right breast at 9 o'clock. 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    03/22/2021 Cancer Staging   Staging form: Breast, AJCC 8th Edition - Clinical stage from 03/22/2021: Stage IA (cT1b, cN0, cM0, G3, ER+, PR-, HER2+) - Signed by Odean Potts, MD on 03/22/2021 Stage prefix: Initial diagnosis Histologic grading system: 3 grade system    Genetic Testing   [facility] CustomNext Panel was Negative. Report date is 04/03/2021.  The CustomNext-Cancer+RNAinsight panel offered by [facility] includes sequencing and rearrangement analysis for the following 47 genes:  APC, ATM, AXIN2, BARD1, BMPR1A, BRCA1, BRCA2, BRIP1, CDH1, CDK4, CDKN2A, CHEK2, CTNNA1, DICER1, EPCAM, GREM1, HOXB13, KIT, MEN1, MLH1, MSH2, MSH3, MSH6, MUTYH, NBN, NF1, NTHL1, PALB2, PDGFRA, PMS2, POLD1, POLE, PTEN, RAD50, RAD51C, RAD51D, SDHA, SDHB, SDHC, SDHD, SMAD4, SMARCA4, STK11, TP53, TSC1, TSC2, and VHL.  RNA data is routinely analyzed for use in variant interpretation for all genes.   04/20/2021 Surgery   Rt Lumpectomy: Grade 3 IDC 1.9 cm 0/5 Ln neg, Margins Neg, Her2+, Copy #5.85, ratio 3.16 ER+(60%)/PR+(0%), Ki-67 80%   04/20/2021  Cancer Staging   Staging form: Breast, AJCC 8th Edition - Pathologic stage from 04/20/2021: Stage IA (pT1c, pN0, cM0, G3, ER+, PR-, HER2+) - Signed by Crawford Morna Pickle, NP on 06/30/2021 Stage prefix: Initial diagnosis Histologic grading system: 3 grade system   05/18/2021 - 12/08/2021 Chemotherapy   Patient is on Treatment Plan : BREAST Paclitaxel  + Trastuzumab  q7d / Trastuzumab  q21d     05/18/2021 -  Chemotherapy   Patient is on Treatment Plan : BREAST Paclitaxel  + Trastuzumab  q7d / Trastuzumab  q21d     08/16/2021 - 09/13/2021 Radiation Therapy   Site Technique Total Dose (Gy) Dose per Fx (Gy) Completed Fx Beam Energies  Breast, Right: Breast_R 3D 40.05/40.05 2.67 15/15 10X  Breast, Right: Breast_R_Bst 3D 12/12 2 6/6 6X, 10X     10/05/2021 -  Anti-estrogen oral therapy   Letrozole  x 5-7 years     CHIEF COMPLIANT: Follow-up on letrozole  therapy  HISTORY OF PRESENT ILLNESS:  History of Present Illness Susan Reid is a 59 year old female with stage IA ER+/PR-/HER2+ invasive ductal carcinoma of the right breast, status post lumpectomy, adjuvant chemotherapy, trastuzumab , radiation, and currently on adjuvant letrozole  and Zometa , who presents for routine oncology follow-up and management of treatment-related issues.  She was diagnosed in November 2022 and completed lumpectomy, adjuvant paclitaxel /trastuzumab , and radiation by mid-2023. She has taken adjuvant letrozole  since June 2023. She denies new or enlarging breast masses, breast pain, nipple discharge, fevers, chills, or night sweats.  She has persistent leg aches and stiffness that worsen with inactivity and improve with exercise and stretching. Symptoms are tolerable and do not limit daily activities but remain bothersome.  After a recent contrast-enhanced mammogram with a new contrast agent, she developed a  delayed hypersensitivity reaction starting two days later with burning and a diffuse, measles-like pruritic rash over  the chest, arms, and back, sparing the face. She took prednisone , Benadryl , and Pepcid  with complete resolution. Admelog did not help pruritus. She previously tolerated other contrast agents. She is willing to undergo future contrast studies with steroid and antihistamine premedication, with alternative imaging options discussed if this is not effective.  She receives Zometa  every six months for osteoporosis, started June 2023, with her next infusion due today. Her most recent DEXA showed a T-score of -2.5. She has had no new fractures or other bone complications.  She remains active, recently traveled to [place] , and has no additional new symptoms relevant to her cancer or treatment.       ALLERGIES:  is allergic to nitrates, organic; iodinated contrast media; and covid-19 (mrna) vaccine.  MEDICATIONS:  Current Outpatient Medications  Medication Sig Dispense Refill   Ascorbic Acid (VITAMIN C PO) Take 500 mg by mouth daily.     cholecalciferol (VITAMIN D ) 1000 UNITS tablet Take 1,000 Units by mouth daily.     fluticasone (FLONASE) 50 MCG/ACT nasal spray Place 1 spray into both nostrils daily as needed for allergies or rhinitis.     letrozole  (FEMARA ) 2.5 MG tablet TAKE 1 TABLET BY MOUTH EVERY DAY 90 tablet 3   loratadine (CLARITIN) 10 MG tablet Take 10 mg by mouth daily as needed for allergies.     Multiple Vitamin (MULTIVITAMIN) tablet Take 1 tablet by mouth daily.     No current facility-administered medications for this visit.    PHYSICAL EXAMINATION: ECOG PERFORMANCE STATUS: 1 - Symptomatic but completely ambulatory  There were no vitals filed for this visit. There were no vitals filed for this visit.  Physical Exam   (exam performed in the presence of a chaperone)  LABORATORY DATA:  I have reviewed the data as listed    Latest Ref Rng & Units 11/01/2023   11:28 AM 05/09/2023    1:22 PM 11/06/2022    8:52 AM  CMP  Glucose 70 - 99 mg/dL 89  93  73   BUN 6 - 20 mg/dL 14  15   18    Creatinine 0.44 - 1.00 mg/dL 9.24  9.08  9.23   Sodium 135 - 145 mmol/L 142  140  143   Potassium 3.5 - 5.1 mmol/L 4.1  4.0  4.0   Chloride 98 - 111 mmol/L 106  104  105   CO2 22 - 32 mmol/L 29  30  31    Calcium 8.9 - 10.3 mg/dL 9.9  9.8  89.9   Total Protein 6.5 - 8.1 g/dL 7.2  7.0  6.9   Total Bilirubin 0.0 - 1.2 mg/dL 0.5  0.4  0.5   Alkaline Phos 38 - 126 U/L 67  67  64   AST 15 - 41 U/L 21  22  21    ALT 0 - 44 U/L 19  17  17      Lab Results  Component Value Date   WBC 5.9 05/14/2024   HGB 14.1 05/14/2024   HCT 43.1 05/14/2024   MCV 91.3 05/14/2024   PLT 278 05/14/2024   NEUTROABS 3.8 05/14/2024    ASSESSMENT & PLAN:  Malignant neoplasm of upper-outer quadrant of right breast in female, estrogen receptor positive (HCC) 05/14/2020:Palpable right breast mass diagnostic mammogram: 1.6 cm mass in the right breast at 9 o'clock. Biopsy: Grade 2-3 invasive ductal carcinoma, Her2+, Copy #5.85, ratio 3.16 ER+(60%)/PR+(0%), Ki-67  80%.  04/20/21: Rt Lumpectomy: Grade 3 IDC 0/5 Ln neg, Margins Neg, Her2+, Copy #5.85, ratio 3.16 ER+(60%)/PR+(0%), Ki-67 80%.   Treatment Plan: 1. adjuvant chemotherapy with Taxol  Herceptin  followed by Herceptin  maintenance started 05/18/2021 2.  Adjuvant radiation therapy completed 09/13/2021 3.  Adjuvant antiestrogen therapy with letrozole  started 10/05/2021 Zometa  infusion every 6 months x2 years --------------------------------------------------------------------------------------------------------------------------------------------------- Current treatment: Herceptin  (until January 2024), letrozole  started 10/05/2021 Osteoporosis: Continue with Zometa  infusions for another year.  She will get a bone density November 2025.  Based on that we might change Zometa  to once a year after that.   Letrozole  toxicities: Denies any adverse effects to letrozole    Signatera: Negative   Patient requested that she only see me for follow-up.   Breast cancer  surveillance: Contrast-enhanced mammogram 03/11/2024: Benign breast density category D.   Breast exam 05/14/2024: Benign   Return to clinic in 1 year for follow-up ------------------------------------- Assessment and Plan Assessment & Plan Estrogen receptor positive malignant neoplasm of the right breast  Surveillance imaging and Signatera ctDNA testing showed no recurrence. Zometa  administered for bone health. - Continued letrozole  2.5 mg daily. - Continued surveillance with annual breast exam and imaging, alternating contrast-enhanced mammogram and MRI. - Continued Signatera ctDNA testing every six months; next test in April. - Continued Zometa  infusions every six months; consider annual frequency post bone density assessment. - Ordered CBC and CMP prior to Zometa  infusion.  Osteoporosis Bone density T-score -2.5. - Ordered bone density scan for November. - Reassess Zometa  frequency post bone density results; plan to decrease to annual if T-score improves toward -2.0. - Continued vitamin D  supplementation.  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 Risk for recurrence with future exposure. Imaging plan requires premedication and alternative modalities if reactions persist. - Prescribed premedication protocol: prednisone  20 mg at 12, 6, and 1 hour prior, Benadryl  1 hour prior to contrast. - Documented contrast allergy and premedication protocol in medical record. - Advised her to notify radiology of contrast allergy and premedication protocol prior to imaging. - Consider alternative imaging strategies if premedication fails.      No orders of the defined types were placed in this encounter.  The patient has a good understanding of the overall plan. she agrees with it. she will call with any problems that may develop before the next visit here.  I personally spent a total of 30 minutes in the  care of the patient today including preparing to see the patient, getting/reviewing separately obtained history, performing a medically appropriate exam/evaluation, counseling and educating, placing orders, referring and communicating with other health care professionals, documenting clinical information in the EHR, independently interpreting results, communicating results, and coordinating care.   Viinay K Rayvn Rickerson, MD 05/14/24    "

## 2024-05-14 NOTE — Patient Instructions (Signed)

## 2024-05-15 ENCOUNTER — Other Ambulatory Visit: Payer: Self-pay

## 2024-05-21 ENCOUNTER — Other Ambulatory Visit: Payer: Self-pay

## 2024-05-28 ENCOUNTER — Other Ambulatory Visit: Payer: Self-pay

## 2024-11-02 ENCOUNTER — Ambulatory Visit

## 2024-11-11 ENCOUNTER — Ambulatory Visit: Admitting: Physician Assistant

## 2024-11-12 ENCOUNTER — Inpatient Hospital Stay

## 2024-11-19 ENCOUNTER — Ambulatory Visit: Admitting: Dermatology

## 2025-05-13 ENCOUNTER — Inpatient Hospital Stay

## 2025-05-13 ENCOUNTER — Inpatient Hospital Stay: Admitting: Hematology and Oncology
